# Patient Record
Sex: Female | Born: 1945 | Hispanic: No | Smoking: Former smoker
Health system: Southern US, Community
[De-identification: ages and names within clinical notes are randomized; demographics above are authoritative.]

## PROBLEM LIST (undated history)

## (undated) DIAGNOSIS — N840 Polyp of corpus uteri: Secondary | ICD-10-CM

## (undated) DIAGNOSIS — F32A Depression, unspecified: Secondary | ICD-10-CM

## (undated) DIAGNOSIS — I10 Essential (primary) hypertension: Secondary | ICD-10-CM

## (undated) DIAGNOSIS — F329 Major depressive disorder, single episode, unspecified: Secondary | ICD-10-CM

## (undated) DIAGNOSIS — M1990 Unspecified osteoarthritis, unspecified site: Secondary | ICD-10-CM

## (undated) DIAGNOSIS — F419 Anxiety disorder, unspecified: Secondary | ICD-10-CM

## (undated) DIAGNOSIS — C801 Malignant (primary) neoplasm, unspecified: Secondary | ICD-10-CM

## (undated) DIAGNOSIS — E039 Hypothyroidism, unspecified: Secondary | ICD-10-CM

## (undated) DIAGNOSIS — E785 Hyperlipidemia, unspecified: Secondary | ICD-10-CM

## (undated) HISTORY — DX: Anxiety disorder, unspecified: F41.9

## (undated) HISTORY — DX: Essential (primary) hypertension: I10

## (undated) HISTORY — DX: Hyperlipidemia, unspecified: E78.5

## (undated) HISTORY — PX: TOTAL KNEE ARTHROPLASTY: SHX125

## (undated) HISTORY — DX: Depression, unspecified: F32.A

## (undated) HISTORY — DX: Malignant (primary) neoplasm, unspecified: C80.1

## (undated) HISTORY — PX: BREAST SURGERY: SHX581

## (undated) HISTORY — DX: Major depressive disorder, single episode, unspecified: F32.9

## (undated) HISTORY — PX: TOTAL ABDOMINAL HYSTERECTOMY W/ BILATERAL SALPINGOOPHORECTOMY: SHX83

## (undated) HISTORY — PX: APPENDECTOMY: SHX54

---

## 1975-03-21 HISTORY — PX: LAPAROSCOPY FOR ECTOPIC PREGNANCY: SUR765

## 2012-07-04 ENCOUNTER — Encounter: Payer: Self-pay | Admitting: Family

## 2012-07-04 ENCOUNTER — Ambulatory Visit (INDEPENDENT_AMBULATORY_CARE_PROVIDER_SITE_OTHER): Payer: Medicare HMO | Admitting: Family

## 2012-07-04 VITALS — BP 96/60 | HR 71 | Ht 64.5 in | Wt 195.0 lb

## 2012-07-04 DIAGNOSIS — F32A Depression, unspecified: Secondary | ICD-10-CM | POA: Insufficient documentation

## 2012-07-04 DIAGNOSIS — M1710 Unilateral primary osteoarthritis, unspecified knee: Secondary | ICD-10-CM

## 2012-07-04 DIAGNOSIS — E7800 Pure hypercholesterolemia, unspecified: Secondary | ICD-10-CM

## 2012-07-04 DIAGNOSIS — I10 Essential (primary) hypertension: Secondary | ICD-10-CM

## 2012-07-04 DIAGNOSIS — E039 Hypothyroidism, unspecified: Secondary | ICD-10-CM | POA: Insufficient documentation

## 2012-07-04 DIAGNOSIS — E559 Vitamin D deficiency, unspecified: Secondary | ICD-10-CM

## 2012-07-04 DIAGNOSIS — F329 Major depressive disorder, single episode, unspecified: Secondary | ICD-10-CM

## 2012-07-04 DIAGNOSIS — E119 Type 2 diabetes mellitus without complications: Secondary | ICD-10-CM

## 2012-07-04 DIAGNOSIS — Z1231 Encounter for screening mammogram for malignant neoplasm of breast: Secondary | ICD-10-CM

## 2012-07-04 DIAGNOSIS — IMO0002 Reserved for concepts with insufficient information to code with codable children: Secondary | ICD-10-CM

## 2012-07-04 DIAGNOSIS — F3289 Other specified depressive episodes: Secondary | ICD-10-CM

## 2012-07-04 LAB — CBC WITH DIFFERENTIAL/PLATELET
Basophils Absolute: 0 10*3/uL (ref 0.0–0.1)
Basophils Relative: 0.4 % (ref 0.0–3.0)
Eosinophils Absolute: 0.2 10*3/uL (ref 0.0–0.7)
Eosinophils Relative: 3.3 % (ref 0.0–5.0)
HCT: 38.7 % (ref 36.0–46.0)
Hemoglobin: 12.9 g/dL (ref 12.0–15.0)
Lymphs Abs: 1.8 10*3/uL (ref 0.7–4.0)
MCV: 89.1 fl (ref 78.0–100.0)
Monocytes Relative: 5.5 % (ref 3.0–12.0)
Platelets: 215 10*3/uL (ref 150.0–400.0)
RBC: 4.34 Mil/uL (ref 3.87–5.11)
WBC: 5.3 10*3/uL (ref 4.5–10.5)

## 2012-07-04 LAB — TSH: TSH: 0.71 u[IU]/mL (ref 0.35–5.50)

## 2012-07-04 LAB — BASIC METABOLIC PANEL
BUN: 27 mg/dL — ABNORMAL HIGH (ref 6–23)
CO2: 33 mEq/L — ABNORMAL HIGH (ref 19–32)
Calcium: 9.5 mg/dL (ref 8.4–10.5)
Creatinine, Ser: 1.3 mg/dL — ABNORMAL HIGH (ref 0.4–1.2)
GFR: 44.38 mL/min — ABNORMAL LOW (ref >60.00)
Glucose, Bld: 78 mg/dL (ref 70–99)
Sodium: 142 mEq/L (ref 135–145)

## 2012-07-04 LAB — LIPID PANEL: Total CHOL/HDL Ratio: 3

## 2012-07-04 MED ORDER — ATORVASTATIN CALCIUM 20 MG PO TABS: 20.0000 mg | ORAL_TABLET | Freq: Every day | ORAL | Status: DC

## 2012-07-04 NOTE — Progress Notes (Signed)
Subjective:    Patient ID: Kara Huynh, female    DOB: 1945/05/08, 67 y.o.   MRN: [id number]  HPI 67 year old white female, smoker, new patient to the practice is in today with a history of Hypertension, Hypercholesterolemia, Arthritis, Hypothyroidism, and Hypercholesterolemia. She has c/o right index finger pain x 3-4 days. Report a recent relocation from [place] to [place]. It continues to be tender with movement. Her symptoms have improved. She rates the pain 3/10. Has a history of arthritis.    Review of Systems  Constitutional: Negative.   HENT: Negative.   Eyes: Negative.   Respiratory: Negative.   Cardiovascular: Negative.   Gastrointestinal: Negative.   Endocrine: Negative.   Genitourinary: Negative.   Musculoskeletal: Negative.   Skin: Negative.   Allergic/Immunologic: Negative.   Neurological: Negative.   Hematological: Negative.   Psychiatric/Behavioral: Negative.    Past Medical History  Diagnosis Date  . Diabetes mellitus without complication   . Hyperlipidemia   . Hypertension   . Thyroid disease     History   Social History  . Marital Status: Married    Spouse Name: N/A    Number of Children: N/A  . Years of Education: N/A   Occupational History  . Not on file.   Social History Main Topics  . Smoking status: Former Smoker    Quit date: 07/04/2009  . Smokeless tobacco: Not on file  . Alcohol Use: Yes     Comment: twice monthly  . Drug Use: No  . Sexually Active: Not on file   Other Topics Concern  . Not on file   Social History Narrative  . No narrative on file    Past Surgical History  Procedure Laterality Date  . Total knee arthroplasty Bilateral   . Breast surgery      biopsy  . Appendectomy      Family History  Problem Relation Age of Onset  . Arthritis Mother   . Mental illness Mother   . Diabetes Mother   . Cancer Father     colon, lung, and prostate  . Arthritis Father   . Hyperlipidemia Father   . Stroke Father   . Heart  disease Father   . Hypertension Father     Allergies not on file  No current outpatient prescriptions on file prior to visit.   No current facility-administered medications on file prior to visit.    BP 96/60  Pulse 71  Ht 5' 4.5" (1.638 m)  Wt 195 lb (88.451 kg)  BMI 32.97 kg/m2  SpO2 98%chart    Objective:   Physical Exam  Constitutional: She is oriented to person, place, and time. She appears well-developed and well-nourished.  HENT:  Right Ear: External ear normal.  Mouth/Throat: Oropharynx is clear and moist.  Eyes: Conjunctivae are normal. Pupils are equal, round, and reactive to light.  Neck: Normal range of motion. Neck supple. No thyromegaly present.  Cardiovascular: Normal rate, regular rhythm and normal heart sounds.   Pulmonary/Chest: Effort normal and breath sounds normal.  Abdominal: Soft. Bowel sounds are normal.  Musculoskeletal: Normal range of motion.  Neurological: She is alert and oriented to person, place, and time. She has normal reflexes.  Skin: Skin is warm and dry.  Psychiatric: She has a normal mood and affect.          Assessment & Plan:  Assessment:  1. Type 2 Diabetes  2. Hypothyroidism 3. Hypertension 4. Hypercholesterolemia  5. Osteoarthritis   Plan: Labs sent. Mammogram  ordered. Aleve as needed for arthritis of the left index finger. Call the office if symptoms persist then we'll consider an x-ray. Continue current medications. Call the office with any questions or concerns. Recheck pending labs, in 3 months, and sooner as needed.

## 2012-07-04 NOTE — Patient Instructions (Addendum)
Exercise to Stay Healthy Exercise helps you become and stay healthy. EXERCISE IDEAS AND TIPS Choose exercises that:  You enjoy.  Fit into your day. You do not need to exercise really hard to be healthy. You can do exercises at a slow or medium level and stay healthy. You can:  Stretch before and after working out.  Try yoga, Pilates, or tai chi.  Lift weights.  Walk fast, swim, jog, run, climb stairs, bicycle, dance, or rollerskate.  Take aerobic classes. Exercises that burn about 150 calories:  Running 1  miles in 15 minutes.  Playing volleyball for 45 to 60 minutes.  Washing and waxing a car for 45 to 60 minutes.  Playing touch football for 45 minutes.  Walking 1  miles in 35 minutes.  Pushing a stroller 1  miles in 30 minutes.  Playing basketball for 30 minutes.  Raking leaves for 30 minutes.  Bicycling 5 miles in 30 minutes.  Walking 2 miles in 30 minutes.  Dancing for 30 minutes.  Shoveling snow for 15 minutes.  Swimming laps for 20 minutes.  Walking up stairs for 15 minutes.  Bicycling 4 miles in 15 minutes.  Gardening for 30 to 45 minutes.  Jumping rope for 15 minutes.  Washing windows or floors for 45 to 60 minutes. Document Released: 04/08/2010 Document Revised: 05/29/2011 Document Reviewed: 04/08/2010 ExitCare Patient Information 2013 [employer].  

## 2012-07-05 LAB — VITAMIN D 25 HYDROXY (VIT D DEFICIENCY, FRACTURES): Vit D, 25-Hydroxy: 59 ng/mL (ref 30–89)

## 2012-08-01 ENCOUNTER — Ambulatory Visit (INDEPENDENT_AMBULATORY_CARE_PROVIDER_SITE_OTHER): Payer: Medicare HMO | Admitting: Family

## 2012-08-01 ENCOUNTER — Ambulatory Visit
Admission: RE | Admit: 2012-08-01 | Discharge: 2012-08-01 | Disposition: A | Discharge status: Home / Self Care | Payer: Medicare HMO | Source: Ambulatory Visit | Attending: Family | Admitting: Family

## 2012-08-01 ENCOUNTER — Encounter: Payer: Self-pay | Admitting: Family

## 2012-08-01 VITALS — BP 80/52 | HR 87 | Ht 63.5 in | Wt 198.0 lb

## 2012-08-01 DIAGNOSIS — Z23 Encounter for immunization: Secondary | ICD-10-CM

## 2012-08-01 DIAGNOSIS — Z1231 Encounter for screening mammogram for malignant neoplasm of breast: Secondary | ICD-10-CM

## 2012-08-01 DIAGNOSIS — M19019 Primary osteoarthritis, unspecified shoulder: Secondary | ICD-10-CM

## 2012-08-01 DIAGNOSIS — Z0000 Encounter for general adult medical examination without abnormal findings: Secondary | ICD-10-CM

## 2012-08-01 MED ORDER — METFORMIN HCL 500 MG PO TABS: 500.0000 mg | ORAL_TABLET | Freq: Two times a day (BID) | ORAL | Status: DC

## 2012-08-01 MED ORDER — MELOXICAM 15 MG PO TABS: 15.0000 mg | ORAL_TABLET | Freq: Every day | ORAL | Status: DC

## 2012-08-01 MED ORDER — PHENTERMINE HCL 37.5 MG PO CAPS: 37.5000 mg | ORAL_CAPSULE | ORAL | Status: DC

## 2012-08-01 MED ORDER — LISINOPRIL 20 MG PO TABS: 10.0000 mg | ORAL_TABLET | Freq: Every day | ORAL | Status: DC

## 2012-08-01 MED ORDER — LEVOTHYROXINE SODIUM 25 MCG PO TABS: 25.0000 ug | ORAL_TABLET | Freq: Every day | ORAL | Status: DC

## 2012-08-01 MED ORDER — SERTRALINE HCL 100 MG PO TABS: 150.0000 mg | ORAL_TABLET | Freq: Every day | ORAL | Status: DC

## 2012-08-01 MED ORDER — LISINOPRIL 20 MG PO TABS: 20.0000 mg | ORAL_TABLET | Freq: Every day | ORAL | Status: DC

## 2012-08-01 NOTE — Patient Instructions (Addendum)
Osteoarthritis Osteoarthritis is the most common form of arthritis. It is redness, soreness, and swelling (inflammation) affecting the cartilage. Cartilage acts as a cushion, covering the ends of bones where they meet to form a joint. CAUSES  Over time, the cartilage begins to wear away. This causes bone to rub on bone. This produces pain and stiffness in the affected joints. Factors that contribute to this problem are:  Excessive body weight.  Age.  Overuse of joints. SYMPTOMS   People with osteoarthritis usually experience joint pain, swelling, or stiffness.  Over time, the joint may lose its normal shape.  Small deposits of bone (osteophytes) may grow on the edges of the joint.  Bits of bone or cartilage can break off and float inside the joint space. This may cause more pain and damage.  Osteoarthritis can lead to depression, anxiety, feelings of helplessness, and limitations on daily activities. The most commonly affected joints are in the:  Ends of the fingers.  Thumbs.  Neck.  Lower back.  Knees.  Hips. DIAGNOSIS  Diagnosis is mostly based on your symptoms and exam. Tests may be helpful, including:  X-rays of the affected joint.  A computerized magnetic scan (MRI).  Blood tests to rule out other types of arthritis.  Joint fluid tests. This involves using a needle to draw fluid from the joint and examining the fluid under a microscope. TREATMENT  Goals of treatment are to control pain, improve joint function, maintain a normal body weight, and maintain a healthy lifestyle. Treatment approaches may include:  A prescribed exercise program with rest and joint relief.  Weight control with nutritional education.  Pain relief techniques such as:  Properly applied heat and cold.  Electric pulses delivered to nerve endings under the skin (transcutaneous electrical nerve stimulation, TENS).  Massage.  Certain supplements. Ask your caregiver before using any  supplements, especially in combination with prescribed drugs.  Medicines to control pain, such as:  Acetaminophen.  Nonsteroidal anti-inflammatory drugs (NSAIDs), such as naproxen.  Narcotic or central-acting agents, such as tramadol. This drug carries a risk of addiction and is generally prescribed for short-term use.  Corticosteroids. These can be given orally or as injection. This is a short-term treatment, not recommended for routine use.  Surgery to reposition the bones and relieve pain (osteotomy) or to remove loose pieces of bone and cartilage. Joint replacement may be needed in advanced states of osteoarthritis. HOME CARE INSTRUCTIONS  Your caregiver can recommend specific types of exercise. These may include:  Strengthening exercises. These are done to strengthen the muscles that support joints affected by arthritis. They can be performed with weights or with exercise bands to add resistance.  Aerobic activities. These are exercises, such as brisk walking or low-impact aerobics, that get your heart pumping. They can help keep your lungs and circulatory system in shape.  Range-of-motion activities. These keep your joints limber.  Balance and agility exercises. These help you maintain daily living skills. Learning about your condition and being actively involved in your care will help improve the course of your osteoarthritis. SEEK MEDICAL CARE IF:   You feel hot or your skin turns red.  You develop a rash in addition to your joint pain.  You have an oral temperature above 102 F (38.9 C). FOR MORE INFORMATION  National Institute of Arthritis and Musculoskeletal and Skin Diseases: www.niams.nih.gov National Institute on Aging: www.nia.nih.gov American College of Rheumatology: www.rheumatology.org Document Released: 03/06/2005 Document Revised: 05/29/2011 Document Reviewed: 06/17/2009 Exit[employer] Patient Information 2013 [employer].  

## 2012-08-01 NOTE — Progress Notes (Signed)
Subjective:    Patient ID: Kara Huynh, female    DOB: 01/23/1946, 67 y.o.   MRN: [id number]  HPI This is a routine physical examination for this healthy  Female. Reviewed all health maintenance protocols including mammography colonoscopy bone density and reviewed appropriate screening labs. Her immunization history was reviewed as well as her current medications and allergies refills of her chronic medications were given and the plan for yearly health maintenance was discussed all orders and referrals were made as appropriate.  Patient has concerns of right neck and shoulder pain has been ongoing for several months. The pain is worse in the morning and better as the day progresses. She rates the pain 8/10, worse with movement. Reports experiencing weakness in her right upper extremity over the last several weeks. Has not tried any medication for relief. Also has concerns of obesity. She exercises daily, walking. Would like some assistance with weight reduction.   Review of Systems  Constitutional: Negative.   HENT: Negative.   Eyes: Negative.   Respiratory: Negative.   Cardiovascular: Negative.   Gastrointestinal: Negative.   Endocrine: Negative.   Genitourinary: Negative.   Musculoskeletal: Positive for arthralgias. Negative for myalgias, back pain, joint swelling and gait problem.       Right shoulder pain and right neck pain  Allergic/Immunologic: Negative.   Neurological: Negative.   Hematological: Negative.   Psychiatric/Behavioral: Negative.    Past Medical History  Diagnosis Date  . Diabetes mellitus without complication   . Hyperlipidemia   . Hypertension   . Thyroid disease     History   Social History  . Marital Status: Married    Spouse Name: N/A    Number of Children: N/A  . Years of Education: N/A   Occupational History  . Not on file.   Social History Main Topics  . Smoking status: Former Smoker    Quit date: 07/04/2009  . Smokeless tobacco: Not on file   . Alcohol Use: Yes     Comment: twice monthly  . Drug Use: No  . Sexually Active: Not on file   Other Topics Concern  . Not on file   Social History Narrative  . No narrative on file    Past Surgical History  Procedure Laterality Date  . Total knee arthroplasty Bilateral   . Breast surgery      biopsy  . Appendectomy      Family History  Problem Relation Age of Onset  . Arthritis Mother   . Mental illness Mother   . Diabetes Mother   . Cancer Father     colon, lung, and prostate  . Arthritis Father   . Hyperlipidemia Father   . Stroke Father   . Heart disease Father   . Hypertension Father     Not on File  Current Outpatient Prescriptions on File Prior to Visit  Medication Sig Dispense Refill  . atorvastatin (LIPITOR) 20 MG tablet Take 1 tablet (20 mg total) by mouth daily.  90 tablet  1  . Cholecalciferol (VITAMIN D) 2000 UNITS CAPS Take by mouth.      . Cyanocobalamin 6000 MCG SUBL Place under the tongue.       No current facility-administered medications on file prior to visit.    BP 80/52  Pulse 87  Ht 5' 3.5" (1.613 m)  Wt 198 lb (89.812 kg)  BMI 34.52 kg/m2  SpO2 98%chart    Objective:   Physical Exam  Constitutional: She is oriented to person, place,  and time. She appears well-developed and well-nourished.  HENT:  Head: Normocephalic.  Right Ear: External ear normal.  Left Ear: External ear normal.  Nose: Nose normal.  Mouth/Throat: Oropharynx is clear and moist.  Eyes: Conjunctivae and EOM are normal. Pupils are equal, round, and reactive to light.  Neck: Normal range of motion. Neck supple.  Cardiovascular: Normal rate, regular rhythm and normal heart sounds.   Pulmonary/Chest: Effort normal and breath sounds normal.  Abdominal: Soft. Bowel sounds are normal. She exhibits no distension. There is no tenderness. There is no rebound and no guarding.  Musculoskeletal: Normal range of motion.  Neurological: She is alert and oriented to person,  place, and time. She has normal reflexes. She displays normal reflexes. No cranial nerve deficit. Coordination normal.  Skin: Skin is warm and dry.  Psychiatric: She has a normal mood and affect.      EKG: Normal sinus rhythm, within normal limits      Assessment & Plan:  Assessment:  1. Complete physical exam 2. Right shoulder pain 3. Obesity 4. Hypertension  Plan: Immunizations updated. Mammogram done this morning. Start Mobic 15 mg one tablet by mouth with food daily. Decrease lisinopril to one half tablet once daily since she's hypotensive today and her previous office visit. Continue exercise daily. Will try patient on phentermine 37.5 mg once daily x1 month. Recheck in 4 weeks discuss potential side effects and risks. Patient verbalized understanding. Call the office with any questions or concerns. Cipro my chart. Recheck a schedule, in 4 weeks, and sooner as needed.

## 2012-10-03 ENCOUNTER — Telehealth: Payer: Self-pay | Admitting: Family

## 2012-10-03 NOTE — Telephone Encounter (Signed)
Patient called regarding prescription issue. When triage nurse called back, there was no answer. Left message for patient to call office back.

## 2012-10-03 NOTE — Telephone Encounter (Signed)
Call back attempted at 1616 on 7-17, vmail left

## 2012-10-03 NOTE — Telephone Encounter (Signed)
Left a message for pt to return call 

## 2012-10-04 NOTE — Telephone Encounter (Signed)
Pt called Kara Huynh back. Padonda pt - had rx for phentermine 37.5mg . Pt states that she is having no side effects, but also, it is not working at all.  She wants to know: change in dosage, or perhaps new medication or different help.

## 2012-10-07 NOTE — Telephone Encounter (Signed)
Left a message for pt to return call 

## 2012-10-07 NOTE — Telephone Encounter (Signed)
Are you exercising??? You must exercise 30 min a day at least 3 times a day. No change in med dosage.

## 2012-10-08 NOTE — Telephone Encounter (Signed)
Left detailed message to notify pt of Padonda's note. Ok to leave detailed message per [facility] note

## 2012-10-10 ENCOUNTER — Other Ambulatory Visit: Payer: Self-pay | Admitting: Family

## 2012-11-11 ENCOUNTER — Other Ambulatory Visit (HOSPITAL_COMMUNITY)
Admission: RE | Admit: 2012-11-11 | Discharge: 2012-11-11 | Disposition: A | Discharge status: Home / Self Care | Payer: Medicare HMO | Source: Ambulatory Visit | Attending: Family Medicine | Admitting: Family Medicine

## 2012-11-11 ENCOUNTER — Encounter: Payer: Self-pay | Admitting: Family Medicine

## 2012-11-11 ENCOUNTER — Ambulatory Visit (INDEPENDENT_AMBULATORY_CARE_PROVIDER_SITE_OTHER): Payer: Medicare HMO | Admitting: Family Medicine

## 2012-11-11 VITALS — BP 98/70 | Temp 99.1°F | Wt 201.0 lb

## 2012-11-11 DIAGNOSIS — Z113 Encounter for screening for infections with a predominantly sexual mode of transmission: Secondary | ICD-10-CM | POA: Insufficient documentation

## 2012-11-11 DIAGNOSIS — N939 Abnormal uterine and vaginal bleeding, unspecified: Secondary | ICD-10-CM

## 2012-11-11 DIAGNOSIS — N760 Acute vaginitis: Secondary | ICD-10-CM | POA: Insufficient documentation

## 2012-11-11 DIAGNOSIS — N898 Other specified noninflammatory disorders of vagina: Secondary | ICD-10-CM

## 2012-11-11 DIAGNOSIS — N950 Postmenopausal bleeding: Secondary | ICD-10-CM

## 2012-11-11 NOTE — Patient Instructions (Signed)
-  We have ordered labs or studies at this visit. It can take up to 1-2 weeks for results and processing. We will contact you with instructions IF your results are abnormal. Normal results will be released to your [facility]. If you have not heard from [name] or can not find your results in [facility] in 2 weeks please contact our office.  -We placed a referral for you as discussed - it will be very important that you see the gynecologist for evaluation regarding your vaginal bleeding. It usually takes about 1-2 weeks to process and schedule this referral. If you have not heard from [name] regarding this appointment in 2 weeks please contact our office.

## 2012-11-11 NOTE — Progress Notes (Signed)
Chief Complaint  Patient presents with  . Vaginal Bleeding    and hotflashes     HPI:  Kara Huynh is a 67 yo F patient of Adline Mango here for an acute visit for vaginal bleeding: -a little vaginal spotting start 4 days ago, none today -maybe a little discharge and odor -denies: pain, itchy, rectal bleeding, pelvic pain, NVD, fevers -postmenopausal, has never had bleeding after menopause since then   ROS: See pertinent positives and negatives per HPI.  Past Medical History  Diagnosis Date  . Diabetes mellitus without complication   . Hyperlipidemia   . Hypertension   . Thyroid disease     Family History  Problem Relation Age of Onset  . Arthritis Mother   . Mental illness Mother   . Diabetes Mother   . Cancer Father     colon, lung, and prostate  . Arthritis Father   . Hyperlipidemia Father   . Stroke Father   . Heart disease Father   . Hypertension Father     History   Social History  . Marital Status: Married    Spouse Name: N/A    Number of Children: N/A  . Years of Education: N/A   Social History Main Topics  . Smoking status: Former Smoker    Quit date: 07/04/2009  . Smokeless tobacco: None  . Alcohol Use: Yes     Comment: twice monthly  . Drug Use: No  . Sexual Activity: None   Other Topics Concern  . None   Social History Narrative  . None    Current outpatient prescriptions:atorvastatin (LIPITOR) 20 MG tablet, Take 1 tablet (20 mg total) by mouth daily., Disp: 90 tablet, Rfl: 1;  Cholecalciferol (VITAMIN D) 2000 UNITS CAPS, Take by mouth., Disp: , Rfl: ;  Cyanocobalamin 6000 MCG SUBL, Place under the tongue., Disp: , Rfl: ;  levothyroxine (SYNTHROID) 25 MCG tablet, Take 1 tablet (25 mcg total) by mouth daily before breakfast., Disp: 90 tablet, Rfl: 1 lisinopril (PRINIVIL,ZESTRIL) 20 MG tablet, Take 0.5 tablets (10 mg total) by mouth daily., Disp: 45 tablet, Rfl: 1;  meloxicam (MOBIC) 15 MG tablet, Take 1 tablet (15 mg total) by mouth  daily., Disp: 30 tablet, Rfl: 4;  metFORMIN (GLUCOPHAGE) 500 MG tablet, Take 1 tablet (500 mg total) by mouth 2 (two) times daily with a meal., Disp: 180 tablet, Rfl: 1 sertraline (ZOLOFT) 100 MG tablet, Take 1.5 tablets (150 mg total) by mouth daily., Disp: 135 tablet, Rfl: 1;  phentermine 37.5 MG capsule, Take 1 capsule (37.5 mg total) by mouth every morning., Disp: 30 capsule, Rfl: 1  EXAM:  Filed Vitals:   11/11/12 1053  BP: 98/70  Temp: 99.1 F (37.3 C)    Body mass index is 35.04 kg/(m^2).  GENERAL: vitals reviewed and listed above, alert, oriented, appears well hydrated and in no acute distress  HEENT: atraumatic, conjunttiva clear, no obvious abnormalities on inspection of external nose and ears  NECK: no obvious masses on inspection  GU: no lesion on ext/int genitalia, light brown vag discharge, on bimanual cervix uterus feels firm - no CMT  ABD: BS+, soft, NTP  MS: moves all extremities without noticeable abnormality  PSYCH: pleasant and cooperative, no obvious depression or anxiety  ASSESSMENT AND PLAN:  Discussed the following assessment and plan:  Vaginal spotting - Plan: Cervicovaginal ancillary only  Postmenopausal bleeding - Plan: Ambulatory referral to Gynecology  -advised to see gyn given post menopausal vaginal bleeding, discussed benign and potentially serious causes -  referral placed -today obtained wet prep, GC/Chlam testing and will contact pt with results and tx if needed -Patient advised to return or notify a doctor immediately if symptoms worsen or persist or new concerns arise.  Patient Instructions  -We have ordered labs or studies at this visit. It can take up to 1-2 weeks for results and processing. We will contact you with instructions IF your results are abnormal. Normal results will be released to your [facility]. If you have not heard from [name] or can not find your results in [facility] in 2 weeks please contact our office.  -We placed a referral  for you as discussed - it will be very important that you see the gynecologist for evaluation regarding your vaginal bleeding. It usually takes about 1-2 weeks to process and schedule this referral. If you have not heard from [name] regarding this appointment in 2 weeks please contact our office.           Kriste Basque R.

## 2012-11-13 NOTE — Progress Notes (Signed)
Quick Note:  Called and spoke with pt and pt is aware. ______ 

## 2012-11-20 ENCOUNTER — Telehealth: Payer: Self-pay | Admitting: Obstetrics and Gynecology

## 2012-11-20 ENCOUNTER — Encounter: Payer: Self-pay | Admitting: Obstetrics and Gynecology

## 2012-11-20 NOTE — Telephone Encounter (Signed)
Please call this internal new patient doctor referral and get her rescheduled with Dr. Edward Jolly or another MD. Patient was referred for  post menopausal bleeding. This is the second time we have rescheduled her appointment as she was initially in an 8:30 open slot on Friday's office meeting morning. This patient was on today's schedule with Dr. Edward Jolly. Thanks, Mervin Kung

## 2012-11-21 NOTE — Telephone Encounter (Signed)
Call to patient this am and appointment scheduled for 12-01-12.

## 2012-11-22 ENCOUNTER — Encounter: Payer: Self-pay | Admitting: Obstetrics and Gynecology

## 2012-11-22 NOTE — Telephone Encounter (Signed)
Thank you :)

## 2012-11-25 ENCOUNTER — Telehealth: Payer: Self-pay

## 2012-11-25 NOTE — Telephone Encounter (Signed)
Called pt. LMOVM asking her to arrive early for her appt. On 11-27-12.  She has an 8:30am appt. But asked if she could come in at 7:50 or 8:00am.

## 2012-11-27 ENCOUNTER — Encounter: Payer: Self-pay | Admitting: Obstetrics and Gynecology

## 2012-11-27 ENCOUNTER — Ambulatory Visit (INDEPENDENT_AMBULATORY_CARE_PROVIDER_SITE_OTHER): Payer: Medicare HMO | Admitting: Obstetrics and Gynecology

## 2012-11-27 VITALS — BP 134/70 | HR 64 | Ht 65.0 in | Wt 205.0 lb

## 2012-11-27 DIAGNOSIS — N760 Acute vaginitis: Secondary | ICD-10-CM

## 2012-11-27 DIAGNOSIS — R319 Hematuria, unspecified: Secondary | ICD-10-CM

## 2012-11-27 DIAGNOSIS — N950 Postmenopausal bleeding: Secondary | ICD-10-CM

## 2012-11-27 LAB — POCT URINALYSIS DIPSTICK
Bilirubin, UA: NEGATIVE
Ketones, UA: NEGATIVE
Leukocytes, UA: NEGATIVE
Nitrite, UA: NEGATIVE
pH, UA: 5

## 2012-11-27 NOTE — Progress Notes (Signed)
Patient ID: Kara Huynh, female   DOB: Nov 24, 1945, 67 y.o.   MRN: [id number] GYNECOLOGY PROBLEM VISIT  PCP: Kathlynn Grate, FNP  Referring provider:  Kriste Basque, D.O.  HPI: 67 y.o.   Married  Caucasian  female   [id number] with No LMP recorded. Patient is postmenopausal.  LMP age 60. here for   Postmenopausal bleeding. November 08, 2012 noted sudden vaginal pain when got up from watching TV. 3 - 4 days later started having vaginal bleeding - spotting.  Some odor with that.  Saw Dr. Selena Batten 11/11/12.  Had negative GC/CT.   No hormone therapy currently. Took HRT for less than one year. No vaginal estrogen cream.   Urine dip  - trace blood. GYNECOLOGIC HISTORY: No LMP recorded. Patient is postmenopausal. Sexually active:  yes Partner preference: female Contraception:  postmenopausal  Menopausal hormone therapy: no DES exposure:  no Blood transfusions:  no  Sexually transmitted diseases:  no GYN Procedures:  Laparoscopy for ectopic pregnancy in 1977--not sure which fallopian tube was removed.  Also had neosalpingostomy at the same time.  Mammogram:   06/2012 wnl:[facility].  History of left breast biopsy 2 year ago - normal.                Pap:   06/2012 wnl with PCP History of abnormal pap smear:  no   OB History   Grav Para Term Preterm Abortions TAB SAB Ect Mult Living   2    2  1 1            Family History  Problem Relation Age of Onset  . Arthritis Mother   . Mental illness Mother   . Diabetes Mother   . Cancer Father     colon, lung, and prostate  . Arthritis Father   . Hyperlipidemia Father   . Stroke Father   . Heart disease Father   . Hypertension Father     Patient Active Problem List   Diagnosis Date Noted  . Unspecified essential hypertension 07/04/2012  . Diabetes 07/04/2012  . Depression 07/04/2012  . Unspecified hypothyroidism 07/04/2012  . Pure hypercholesterolemia 07/04/2012  . Unspecified vitamin D deficiency 07/04/2012  . Osteoarthrosis,  unspecified whether generalized or localized, involving lower leg 07/04/2012    Past Medical History  Diagnosis Date  . Diabetes mellitus without complication   . Hyperlipidemia   . Hypertension   . Thyroid disease   . Anxiety   . Depression     Past Surgical History  Procedure Laterality Date  . Total knee arthroplasty Bilateral   . Breast surgery      biopsy  . Appendectomy    . Pelvic laparoscopy  1977    ectopic pregnancy    ALLERGIES: Review of patient's allergies indicates no known allergies.  Current Outpatient Prescriptions  Medication Sig Dispense Refill  . atorvastatin (LIPITOR) 20 MG tablet Take 1 tablet (20 mg total) by mouth daily.  90 tablet  1  . Cholecalciferol (VITAMIN D) 2000 UNITS CAPS Take by mouth.      . Cyanocobalamin 6000 MCG SUBL Place under the tongue.      Marland Kitchen levothyroxine (SYNTHROID) 25 MCG tablet Take 1 tablet (25 mcg total) by mouth daily before breakfast.  90 tablet  1  . lisinopril (PRINIVIL,ZESTRIL) 20 MG tablet Take 0.5 tablets (10 mg total) by mouth daily.  45 tablet  1  . meloxicam (MOBIC) 15 MG tablet Take 1 tablet (15 mg total) by mouth daily.  30 tablet  4  . metFORMIN (GLUCOPHAGE) 500 MG tablet Take 1 tablet (500 mg total) by mouth 2 (two) times daily with a meal.  180 tablet  1  . sertraline (ZOLOFT) 100 MG tablet Take 1.5 tablets (150 mg total) by mouth daily.  135 tablet  1   No current facility-administered medications for this visit.     ROS:  Pertinent items are noted in HPI.  SOCIAL HISTORY:  Retired.   PHYSICAL EXAMINATION:    BP 134/70  Pulse 64  Ht 5\' 5"  (1.651 m)  Wt 205 lb (92.987 kg)  BMI 34.11 kg/m2   Wt Readings from Last 3 Encounters:  11/27/12 205 lb (92.987 kg)  11/11/12 201 lb (91.173 kg)  08/01/12 198 lb (89.812 kg)     Ht Readings from Last 3 Encounters:  11/27/12 5\' 5"  (1.651 m)  08/01/12 5' 3.5" (1.613 m)  07/04/12 5' 4.5" (1.638 m)    General appearance: alert, cooperative and appears stated  age Lungs: clear to auscultation bilaterally. Heart: regular rate and rhythm Abdomen: vertical midline incision (States history ruptured appendix), soft, non-tender; no masses,  no organomegaly No abnormal inguinal nodes palpated Neurologic: Grossly normal  Pelvic: External genitalia:  no lesions              Urethra:  normal appearing urethra with no masses, tenderness or lesions              Bartholins and Skenes: normal                 Vagina: normal appearing vagina with normal color and dark yellow discharge, no lesions              Cervix: normal appearance                 Bimanual Exam:  Uterus:  uterus is normal size, shape, consistency and nontender                                      Adnexa: normal adnexa in size, nontender and no masses                                      Rectovaginal: Confirms                                      Anus:  normal sphincter tone, no lesions  Wet prep - pH 5.0.  Negative clue cells, yeast, and trichomonas.  ASSESSMENT  Post menopausal bleeding. Microscopic hematuria.  PLAN  Urine culture. Return for pelvic ultrasound, sonohysterogram, and endometrial biopsy.   An After Visit Summary was printed and given to the patient.

## 2012-11-27 NOTE — Patient Instructions (Signed)
Postmenopausal Bleeding Menopause is commonly referred to as the "change in life." It is a time when the fertile years, the time of ovulating and having menstrual periods, has come to an end. It is also determined by not having menstrual periods for 12 months.  Postmenopausal bleeding is any bleeding a woman has after she has entered into menopause. Any type of postmenopausal bleeding, even if it appears to be a typical menstrual period, is concerning. This should be evaluated by your caregiver.  CAUSES   Hormone therapy.  Cancer of the cervix or cancer of the lining of the uterus (endometrial cancer).  Thinning of the uterine lining (uterine atrophy).  Thyroid diseases.  Certain medicines.  Infection of the uterus or cervix.  Inflammation or irritation of the uterine lining (endometritis).  Estrogen-secreting tumors.  Growths (polyps) on the cervix, uterine lining, or uterus.  Uterine tumors (fibroids).  Being very overweight (obese). DIAGNOSIS  Your caregiver will take a medical history and ask questions. A physical exam will also be performed. Further tests may include:   A transvaginal ultrasound. An ultrasound wand or probe is inserted into your vagina to view the pelvic organs.  A biopsy of the lining of the uterus (endometrium). A sample of the endometrium is removed and examined.  A hysteroscopy. Your caregiver may use an instrument with a light and a camera attached to it (hysteroscope). The hysteroscope is used to look inside the uterus for problems.  A dilation and curettage (D&C). Tissue is removed from the uterine lining to be examined for problems. TREATMENT  Treatment depends on the cause of the bleeding. Some treatments include:   Surgery.  Medicines.  Hormones.  A hysteroscopy or D&C to remove polyps or fibroids.  Changing or stopping a current medicine you are taking. Talk to your caregiver about your specific treatment. HOME CARE INSTRUCTIONS    Maintain a healthy weight.  Keep regular pelvic exams and Pap tests. SEEK MEDICAL CARE IF:   You have bleeding, even if it is light in comparison to your previous periods.  Your bleeding lasts more than 1 week.  You have abdominal pain.  You develop bleeding with sexual intercourse. SEEK IMMEDIATE MEDICAL CARE IF:   You have a fever, chills, headache, dizziness, muscle aches, and bleeding.  You have severe pain with bleeding.  You are passing blood clots.  You have bleeding and need more than 1 pad an hour.  You feel faint. MAKE SURE YOU:  Understand these instructions.  Will watch your condition.  Will get help right away if you are not doing well or get worse. Document Released: 06/14/2005 Document Revised: 05/29/2011 Document Reviewed: 11/10/2010 [employer] Patient Information 2014 [employer].  

## 2012-11-28 LAB — URINE CULTURE: Organism ID, Bacteria: NO GROWTH

## 2012-12-04 ENCOUNTER — Telehealth: Payer: Self-pay | Admitting: Obstetrics and Gynecology

## 2012-12-04 NOTE — Telephone Encounter (Signed)
Returning your call. °

## 2012-12-04 NOTE — Telephone Encounter (Signed)
Returned call to patient. We discussed her insurance benefits for a SHGM. Since patient does not have out of network benefits she would be responsible for the full amount. Patient is not able to afford this procedure and asked if Dr. Edward Jolly would refer her to another doctor that is in network. Sent a message to Dr. Edward Jolly.

## 2012-12-09 NOTE — Telephone Encounter (Signed)
Returning call.

## 2012-12-23 ENCOUNTER — Other Ambulatory Visit: Payer: Self-pay | Admitting: Family

## 2013-01-01 NOTE — Telephone Encounter (Signed)
Thank you for the followup.

## 2013-01-01 NOTE — Telephone Encounter (Signed)
Spoke with this patient on 9/30 about being referred to Dr. Renaldo Fiddler at [facility]. Patient chose Dr. Renaldo Fiddler due to the fact that Dr. Edward Jolly is not in network [facility]). Patient asked if I would be willing to help her set up an appointment and refer her over. Patient was seen on 12/24/12 by Dr. Renaldo Fiddler and will continue care with her.

## 2013-01-17 ENCOUNTER — Encounter: Payer: Self-pay | Admitting: Family

## 2013-01-23 ENCOUNTER — Other Ambulatory Visit: Payer: Self-pay

## 2013-01-23 ENCOUNTER — Other Ambulatory Visit: Payer: Self-pay | Admitting: Family

## 2013-01-29 ENCOUNTER — Encounter (HOSPITAL_BASED_OUTPATIENT_CLINIC_OR_DEPARTMENT_OTHER): Payer: Self-pay | Admitting: *Deleted

## 2013-01-30 ENCOUNTER — Encounter (HOSPITAL_COMMUNITY): Payer: Self-pay | Admitting: Pharmacist

## 2013-01-30 ENCOUNTER — Encounter (HOSPITAL_COMMUNITY): Payer: Self-pay

## 2013-01-30 ENCOUNTER — Encounter (HOSPITAL_COMMUNITY)
Admission: RE | Admit: 2013-01-30 | Discharge: 2013-01-30 | Disposition: A | Discharge status: Home / Self Care | Payer: Medicare HMO | Source: Ambulatory Visit | Attending: Obstetrics and Gynecology | Admitting: Obstetrics and Gynecology

## 2013-01-30 LAB — BASIC METABOLIC PANEL
BUN: 22 mg/dL (ref 6–23)
CO2: 25 mEq/L (ref 19–32)
Calcium: 9.7 mg/dL (ref 8.4–10.5)
Chloride: 103 mEq/L (ref 96–112)
Creatinine, Ser: 1.23 mg/dL — ABNORMAL HIGH (ref 0.50–1.10)
Glucose, Bld: 82 mg/dL (ref 70–99)
Sodium: 139 mEq/L (ref 135–145)

## 2013-01-30 LAB — CBC
HCT: 39.3 % (ref 36.0–46.0)
MCH: 29.1 pg (ref 26.0–34.0)
MCV: 89.9 fL (ref 78.0–100.0)
Platelets: 224 10*3/uL (ref 150–400)
RBC: 4.37 MIL/uL (ref 3.87–5.11)
RDW: 14.5 % (ref 11.5–15.5)
WBC: 7.2 10*3/uL (ref 4.0–10.5)

## 2013-01-30 MED ORDER — DEXTROSE 5 % IV SOLN
2.0000 g | INTRAVENOUS | Status: AC
  Administered 2013-01-31: 2 g via INTRAVENOUS
  Filled 2013-01-30: qty 2

## 2013-01-30 NOTE — Patient Instructions (Signed)
Your procedure is scheduled on:01/31/13  Enter through the Main Entrance at :1:45 pm  Pick up desk phone and dial [phone] and inform us of your arrival.  Please call [phone] if you have any problems the morning of surgery.  Remember: Do not eat food or drink liquids, including water, after midnight:TONIGHT Clear liquids are ok until:11am Friday   You may brush your teeth the morning of surgery.  Take these meds the morning of surgery with a sip of water: thyroid pill, BP pill DO NOT TAKE METFORMIN FOR 24 HOURS PRIOR TO SURGERY  DO NOT wear jewelry, eye make-up, lipstick,body lotion, or dark fingernail polish.  (Polished toes are ok) You may wear deodorant.  If you are to be admitted after surgery, leave suitcase in car until your room has been assigned. Patients discharged on the day of surgery will not be allowed to drive home. Wear loose fitting, comfortable clothes for your ride home.

## 2013-01-30 NOTE — H&P (Addendum)
67 yo with postmenopausal bleeding presents for surgical mngt  PMHx:  HTN, hypothyroid, hypercholesterol, diabetes PSHx: ectopic, appy, bilateral knee replacement All:  None Meds: atorvastatin, meloxicam,  Lisinopril, metformin, zolfot, syntroid SHx:  Negative x 3  AF, VSs Gen - NAD ABd - soft, NT/ND CV - RRR Lungs - clear Ext - NT, no edema PV - uterus mobile, NT  A/P:  PMB, endometrial mass Hysteroscopy, D&C resection of mass Plan of care reviewed, informed consent

## 2013-01-31 ENCOUNTER — Encounter (HOSPITAL_COMMUNITY)
Admission: RE | Disposition: A | Discharge status: Home / Self Care | Payer: Self-pay | Source: Ambulatory Visit | Attending: Obstetrics and Gynecology

## 2013-01-31 ENCOUNTER — Encounter (HOSPITAL_COMMUNITY): Payer: Self-pay | Admitting: *Deleted

## 2013-01-31 ENCOUNTER — Ambulatory Visit (HOSPITAL_COMMUNITY): Payer: Medicare HMO | Admitting: Registered Nurse

## 2013-01-31 ENCOUNTER — Ambulatory Visit (HOSPITAL_BASED_OUTPATIENT_CLINIC_OR_DEPARTMENT_OTHER)
Admission: RE | Admit: 2013-01-31 | Payer: Medicare HMO | Source: Ambulatory Visit | Admitting: Obstetrics and Gynecology

## 2013-01-31 ENCOUNTER — Encounter (HOSPITAL_BASED_OUTPATIENT_CLINIC_OR_DEPARTMENT_OTHER): Admission: RE | Payer: Self-pay | Source: Ambulatory Visit

## 2013-01-31 ENCOUNTER — Encounter (HOSPITAL_COMMUNITY): Payer: Medicare HMO | Admitting: Registered Nurse

## 2013-01-31 ENCOUNTER — Ambulatory Visit (HOSPITAL_COMMUNITY)
Admission: RE | Admit: 2013-01-31 | Discharge: 2013-01-31 | Disposition: A | Discharge status: Home / Self Care | Payer: Medicare HMO | Source: Ambulatory Visit | Attending: Obstetrics and Gynecology | Admitting: Obstetrics and Gynecology

## 2013-01-31 DIAGNOSIS — N8502 Endometrial intraepithelial neoplasia [EIN]: Secondary | ICD-10-CM | POA: Insufficient documentation

## 2013-01-31 DIAGNOSIS — N840 Polyp of corpus uteri: Secondary | ICD-10-CM | POA: Insufficient documentation

## 2013-01-31 DIAGNOSIS — N950 Postmenopausal bleeding: Secondary | ICD-10-CM | POA: Insufficient documentation

## 2013-01-31 HISTORY — DX: Polyp of corpus uteri: N84.0

## 2013-01-31 HISTORY — DX: Hypothyroidism, unspecified: E03.9

## 2013-01-31 HISTORY — PX: DILITATION & CURRETTAGE/HYSTROSCOPY WITH VERSAPOINT RESECTION: SHX5571

## 2013-01-31 HISTORY — DX: Unspecified osteoarthritis, unspecified site: M19.90

## 2013-01-31 SURGERY — DILATATION & CURETTAGE/HYSTEROSCOPY WITH VERSAPOINT RESECTION
Anesthesia: Choice

## 2013-01-31 SURGERY — DILATATION & CURETTAGE/HYSTEROSCOPY WITH VERSAPOINT RESECTION
Anesthesia: General | Site: Vagina | Wound class: Clean Contaminated

## 2013-01-31 MED ORDER — ONDANSETRON HCL 4 MG/2ML IJ SOLN
INTRAMUSCULAR | Status: AC
  Filled 2013-01-31: qty 2

## 2013-01-31 MED ORDER — LIDOCAINE HCL (CARDIAC) 20 MG/ML IV SOLN
INTRAVENOUS | Status: DC | PRN
  Administered 2013-01-31: 30 mg via INTRAVENOUS

## 2013-01-31 MED ORDER — FENTANYL CITRATE 0.05 MG/ML IJ SOLN: 25.0000 ug | INTRAMUSCULAR | Status: DC | PRN

## 2013-01-31 MED ORDER — LIDOCAINE HCL 1 % IJ SOLN
INTRAMUSCULAR | Status: AC
  Filled 2013-01-31: qty 20

## 2013-01-31 MED ORDER — METOCLOPRAMIDE HCL 5 MG/ML IJ SOLN: 10.0000 mg | Freq: Once | INTRAMUSCULAR | Status: DC | PRN

## 2013-01-31 MED ORDER — MEPERIDINE HCL 25 MG/ML IJ SOLN: 6.2500 mg | INTRAMUSCULAR | Status: DC | PRN

## 2013-01-31 MED ORDER — MIDAZOLAM HCL 2 MG/2ML IJ SOLN
INTRAMUSCULAR | Status: AC
  Filled 2013-01-31: qty 2

## 2013-01-31 MED ORDER — ONDANSETRON HCL 4 MG/2ML IJ SOLN
INTRAMUSCULAR | Status: DC | PRN
  Administered 2013-01-31: 4 mg via INTRAVENOUS

## 2013-01-31 MED ORDER — EPHEDRINE SULFATE 50 MG/ML IJ SOLN
INTRAMUSCULAR | Status: DC | PRN
  Administered 2013-01-31 (×3): 5 mg via INTRAVENOUS

## 2013-01-31 MED ORDER — KETOROLAC TROMETHAMINE 30 MG/ML IJ SOLN
INTRAMUSCULAR | Status: AC
  Filled 2013-01-31: qty 1

## 2013-01-31 MED ORDER — SODIUM CHLORIDE 0.9 % IR SOLN
Status: DC | PRN
  Administered 2013-01-31: 1

## 2013-01-31 MED ORDER — LIDOCAINE HCL (CARDIAC) 20 MG/ML IV SOLN
INTRAVENOUS | Status: AC
  Filled 2013-01-31: qty 5

## 2013-01-31 MED ORDER — EPHEDRINE 5 MG/ML INJ
INTRAVENOUS | Status: AC
  Filled 2013-01-31: qty 10

## 2013-01-31 MED ORDER — FENTANYL CITRATE 0.05 MG/ML IJ SOLN
INTRAMUSCULAR | Status: AC
  Filled 2013-01-31: qty 5

## 2013-01-31 MED ORDER — LACTATED RINGERS IV SOLN
INTRAVENOUS | Status: DC
  Administered 2013-01-31: 15:00:00 via INTRAVENOUS

## 2013-01-31 MED ORDER — MIDAZOLAM HCL 2 MG/2ML IJ SOLN
INTRAMUSCULAR | Status: DC | PRN
  Administered 2013-01-31: 2 mg via INTRAVENOUS

## 2013-01-31 MED ORDER — HYDROCODONE-IBUPROFEN 7.5-200 MG PO TABS: 1.0000 | ORAL_TABLET | Freq: Four times a day (QID) | ORAL | Status: DC | PRN

## 2013-01-31 MED ORDER — LIDOCAINE HCL 1 % IJ SOLN
INTRAMUSCULAR | Status: DC | PRN
  Administered 2013-01-31: 10 mL

## 2013-01-31 MED ORDER — FENTANYL CITRATE 0.05 MG/ML IJ SOLN
INTRAMUSCULAR | Status: DC | PRN
  Administered 2013-01-31: 100 ug via INTRAVENOUS

## 2013-01-31 MED ORDER — PROPOFOL 10 MG/ML IV BOLUS
INTRAVENOUS | Status: DC | PRN
  Administered 2013-01-31: 100 mg via INTRAVENOUS

## 2013-01-31 MED ORDER — KETOROLAC TROMETHAMINE 30 MG/ML IJ SOLN
INTRAMUSCULAR | Status: DC | PRN
  Administered 2013-01-31: 30 mg via INTRAVENOUS

## 2013-01-31 MED ORDER — PROPOFOL 10 MG/ML IV EMUL
INTRAVENOUS | Status: AC
  Filled 2013-01-31: qty 20

## 2013-01-31 SURGICAL SUPPLY — 16 items
CANISTER SUCT 3000ML (MISCELLANEOUS) ×2 IMPLANT
CATH ROBINSON RED A/P 16FR (CATHETERS) ×2 IMPLANT
CLOTH BEACON ORANGE TIMEOUT ST (SAFETY) ×2 IMPLANT
CONTAINER PREFILL 10% NBF 60ML (FORM) ×4 IMPLANT
DRESSING TELFA 8X3 (GAUZE/BANDAGES/DRESSINGS) ×2 IMPLANT
ELECT REM PT RETURN 9FT ADLT (ELECTROSURGICAL)
ELECTRODE REM PT RTRN 9FT ADLT (ELECTROSURGICAL) IMPLANT
ELECTRODE RT ANGLE VERSAPOINT (CUTTING LOOP) ×2 IMPLANT
GLOVE BIO SURGEON STRL SZ 6.5 (GLOVE) ×2 IMPLANT
GOWN STRL REIN XL XLG (GOWN DISPOSABLE) ×4 IMPLANT
LOOP ANGLED CUTTING 22FR (CUTTING LOOP) IMPLANT
NEEDLE SPNL 20GX3.5 QUINCKE YW (NEEDLE) ×2 IMPLANT
PACK HYSTEROSCOPY LF (CUSTOM PROCEDURE TRAY) ×2 IMPLANT
PAD OB MATERNITY 4.3X12.25 (PERSONAL CARE ITEMS) ×2 IMPLANT
TOWEL OR 17X24 6PK STRL BLUE (TOWEL DISPOSABLE) ×4 IMPLANT
WATER STERILE IRR 1000ML POUR (IV SOLUTION) ×2 IMPLANT

## 2013-01-31 NOTE — Transfer of Care (Signed)
Immediate Anesthesia Transfer of Care Note  Patient: Kara Huynh  Procedure(s) Performed: Procedure(s): DILATATION & CURETTAGE/HYSTEROSCOPY WITH CERVICAL BLOCK (N/A)  Patient Location: PACU  Anesthesia Type:General  Level of Consciousness: awake, alert , oriented and patient cooperative  Airway & Oxygen Therapy: Patient Spontanous Breathing  Post-op Assessment: Report given to PACU RN and Post -op Vital signs reviewed and stable  Post vital signs: Reviewed and stable  Complications: No apparent anesthesia complications

## 2013-01-31 NOTE — Anesthesia Preprocedure Evaluation (Signed)
Anesthesia Evaluation  Patient identified by MRN, date of birth, ID band Patient awake    Reviewed: Allergy & Precautions, H&P , NPO status , Patient's Chart, lab work & pertinent test results  Airway Mallampati: III      Dental no notable dental hx. (+) Teeth Intact   Pulmonary neg pulmonary ROS, former smoker,  breath sounds clear to auscultation- rhonchi  Pulmonary exam normal       Cardiovascular hypertension, Pt. on medications Rhythm:Regular Rate:Normal     Neuro/Psych PSYCHIATRIC DISORDERS Anxiety Depression negative neurological ROS     GI/Hepatic Neg liver ROS,   Endo/Other  diabetes, Well Controlled, Type 2, Oral Hypoglycemic AgentsHypothyroidism   Renal/GU negative Renal ROS  negative genitourinary   Musculoskeletal  (+) Arthritis -, Osteoarthritis,    Abdominal   Peds  Hematology negative hematology ROS (+)   Anesthesia Other Findings   Reproductive/Obstetrics Endometrial Polyp PMB                           Anesthesia Physical Anesthesia Plan  ASA: II  Anesthesia Plan: General   Post-op Pain Management:    Induction: Intravenous  Airway Management Planned: LMA  Additional Equipment:   Intra-op Plan:   Post-operative Plan: Extubation in OR  Informed Consent: I have reviewed the patients History and Physical, chart, labs and discussed the procedure including the risks, benefits and alternatives for the proposed anesthesia with the patient or authorized representative who has indicated his/her understanding and acceptance.   Dental advisory given  Plan Discussed with: Anesthesiologist, CRNA and Surgeon  Anesthesia Plan Comments:         Anesthesia Quick Evaluation

## 2013-01-31 NOTE — Anesthesia Postprocedure Evaluation (Signed)
  Anesthesia Post-op Note  Patient: Kara Huynh  Procedure(s) Performed: Procedure(s): DILATATION & CURETTAGE/HYSTEROSCOPY WITH CERVICAL BLOCK (N/A)  Patient Location: PACU  Anesthesia Type:General  Level of Consciousness: awake, alert  and oriented  Airway and Oxygen Therapy: Patient Spontanous Breathing  Post-op Pain: none  Post-op Assessment: Post-op Vital signs reviewed, Patient's Cardiovascular Status Stable, Respiratory Function Stable, Patent Airway, No signs of Nausea or vomiting and Pain level controlled  Post-op Vital Signs: Reviewed and stable  Complications: No apparent anesthesia complications

## 2013-02-01 NOTE — Op Note (Signed)
NAME:  Kara Huynh, Kara Huynh NO.:  [id number]  MEDICAL RECORD NO.:  [id number]  LOCATION:  [facility]                          FACILITY:  [facility]  PHYSICIAN:  Zelphia Cairo, MD    DATE OF BIRTH:  [birth year]  DATE OF PROCEDURE: DATE OF DISCHARGE:  01/31/2013                              OPERATIVE REPORT   PREOPERATIVE DIAGNOSES: 1. Postmenopausal bleeding. 2. Endometrial mass.  POSTOPERATIVE DIAGNOSES: 1. Postmenopausal bleeding. 2. Endometrial mass, path pending.  PROCEDURE: 1. Cervical block. 2. Hysteroscopy. 3. D and C.  SURGEON:  Zelphia Cairo, MD  ANESTHESIA:  General.  SPECIMEN:  Endometrial curettings.  COMPLICATIONS:  None.  CONDITION:  Stable to recovery room.  DESCRIPTION OF PROCEDURE:  The patient was taken to the operating room, where anesthesia was found to be adequate.  She was placed in the dorsal lithotomy position using Allen stirrups.  She was prepped and draped in sterile fashion and an in- and out catheter was used to drain her bladder for an unmeasured amount of urine. Bivalve speculum was placed in the vagina and a cervical block was performed using 1% lidocaine. Single-tooth tenaculum was attached to the anterior lip of the cervix. The cervix was serially dilated using Pratt dilators.  Hysteroscope was inserted.  An irregularly shaped mass was noted on the right anterior uterine wall.  No other abnormalities were noted.  The hysteroscope was removed and a curette was used to resect the endometrial mass. Hysteroscope was reinserted and no other masses or abnormalities were seen.  The mass was removed to the base.  All instruments were then removed from the vagina and cervix.  The cervix was hemostatic.  She was extubated and taken to the recovery room in stable condition.  Sponge, lap, needle, and instrument counts were correct x2.     Zelphia Cairo, MD     GA/MEDQ  D:  01/31/2013  T:  02/01/2013  Job:  [id number]

## 2013-02-03 ENCOUNTER — Encounter (HOSPITAL_COMMUNITY): Payer: Self-pay | Admitting: Obstetrics and Gynecology

## 2013-02-03 LAB — GLUCOSE, CAPILLARY: Glucose-Capillary: 100 mg/dL — ABNORMAL HIGH (ref 70–99)

## 2013-02-11 ENCOUNTER — Telehealth: Payer: Self-pay | Admitting: Gynecologic Oncology

## 2013-02-11 ENCOUNTER — Encounter: Payer: Self-pay | Admitting: Gynecologic Oncology

## 2013-02-11 ENCOUNTER — Ambulatory Visit: Payer: Medicare HMO | Attending: Gynecologic Oncology | Admitting: Gynecologic Oncology

## 2013-02-11 VITALS — BP 116/60 | HR 75 | Temp 98.6°F | Resp 20 | Ht 64.17 in | Wt 209.2 lb

## 2013-02-11 DIAGNOSIS — F411 Generalized anxiety disorder: Secondary | ICD-10-CM | POA: Insufficient documentation

## 2013-02-11 DIAGNOSIS — E119 Type 2 diabetes mellitus without complications: Secondary | ICD-10-CM | POA: Insufficient documentation

## 2013-02-11 DIAGNOSIS — F3289 Other specified depressive episodes: Secondary | ICD-10-CM | POA: Insufficient documentation

## 2013-02-11 DIAGNOSIS — C541 Malignant neoplasm of endometrium: Secondary | ICD-10-CM | POA: Insufficient documentation

## 2013-02-11 DIAGNOSIS — Z79899 Other long term (current) drug therapy: Secondary | ICD-10-CM | POA: Insufficient documentation

## 2013-02-11 DIAGNOSIS — Z800 Family history of malignant neoplasm of digestive organs: Secondary | ICD-10-CM | POA: Insufficient documentation

## 2013-02-11 DIAGNOSIS — C549 Malignant neoplasm of corpus uteri, unspecified: Secondary | ICD-10-CM | POA: Insufficient documentation

## 2013-02-11 DIAGNOSIS — I10 Essential (primary) hypertension: Secondary | ICD-10-CM | POA: Insufficient documentation

## 2013-02-11 DIAGNOSIS — F329 Major depressive disorder, single episode, unspecified: Secondary | ICD-10-CM | POA: Insufficient documentation

## 2013-02-11 DIAGNOSIS — E039 Hypothyroidism, unspecified: Secondary | ICD-10-CM | POA: Insufficient documentation

## 2013-02-11 DIAGNOSIS — E785 Hyperlipidemia, unspecified: Secondary | ICD-10-CM | POA: Insufficient documentation

## 2013-02-11 NOTE — Patient Instructions (Addendum)
Plan for surgery on December 2 with Dr. De Blanch.  Uterine Cancer Uterine cancer is an abnormal growth of tissue (tumor) in the uterus that is cancerous (malignant). Unlike noncancerous (benign) tumors, malignant tumors can spread to other parts of your body. The wall of the uterus has two layers of tissue. The inner layer is the endometrium. The outer layer of muscle tissue is the myometrium. The most common type of uterine cancer begins in the endometrium. This is called endometrial cancer. Cancer that begins in the myometrium is called uterine sarcoma, which is very rare.  RISK FACTORS  Although the exact cause of uterine cancer is unknown, there are a number of risk factors that can increase your chances of getting uterine cancer. They include:  Your age. Uterine cancer occurs mostly in women older than 50 years.   Having an enlarged endometrium (endometrial hyperplasia).   Using hormone therapy.   Obesity.   Taking the drug tamoxifen.   White race.   Infertility.   Never being pregnant.   Beginning menstrual periods at an age younger than 12 years.   Having menstrual periods at an age older than 52 years.   Personal history of ovarian, intestinal, or colorectal cancer.   Having a family history of uterine cancer.   Having a family history of hereditary nonpolyposis colon cancer (HNPCC).   Having diabetes, high blood pressure, thyroid disease, or gallbladder disease.   Long-term use of high-dose birth control pills.   Exposure to radiation.   Smoking.  SIGNS AND SYMPTOMS   Abnormal vaginal bleeding or discharge. Bleeding may start as a watery, blood-streaked flow that gradually contains more blood.   Any vaginal bleeding after menopause.   Difficult or painful urination.   Pain during intercourse.   Pain in the pelvic area.  Mass in the vagina.  Pain or fullness in the abdomen.  Frequent urination.  Bleeding between  periods.  Growth of the stomach.   Unexplained weight loss.  Uterine cancer usually occurs after menopause. However, it may also occur around the time that menopause begins. Abnormal vaginal bleeding is the most common symptom of uterine cancer. Women should not assume that abnormal vaginal bleeding is part of menopause. DIAGNOSIS  Your health care provider will ask about your medical history. He or she may also perform a number of procedures, such as:  A physical and pelvic exam. Your health care provider will feel your pelvis for any lumps.   Blood and urine tests.   X-rays.   Imaging tests, such as CT scans, ultrasonography, or MRIs.   A hysteroscopy to view the inside of your uterus.   A Pap test to sample cells from the cervix and upper vagina to check for abnormal cells.   Taking a tissue sample (biopsy) from the uterine lining to look for cancer cells.   A dilation and curettage (D&C). This involves stretching (dilation) the cervix and scraping (curettage) the inside lining of the uterus to get a tissue sample. The sample is examined under a microscope to look for cancer cells.  Your cancer will be staged to determine its severity and extent. Staging is a careful attempt to find out the size of the tumor, whether the cancer has spread, and if so, to what parts of the body. You may need to have more tests to determine the stage of your cancer. The test results will help determine what treatment plan is best for you. Cancer stages include:   Stage I The  cancer is only found in the uterus.  Stage II The cancer has spread to the cervix.  Stage III The cancer has spread outside the uterus, but not outside the pelvis. The cancer may have spread to the lymph nodes in the pelvis.  Stage IV The cancer has spread to other parts of the body, such as the bladder or rectum. TREATMENT  Most women with uterine cancer are treated with surgery. This includes removing the uterus,  cervix, fallopian tubes, and ovaries (total hysterectomy). Your lymph nodes near the tumor may also be removed. Some women have radiation, chemotherapy, or hormonal therapy. Other women have a combination of these therapies. HOME CARE INSTRUCTIONS   Only take over-the-counter or prescription medicines as directed by your health care provider.   Maintain a healthy diet.  Exercise regularly.   If you have diabetes, high blood pressure, thyroid disease, or gallbladder disease, follow your health care provider's instructions to keep it under control.   Do not smoke.   Consider joining a support group. This may help you learn to cope with the stress of having uterine cancer.   Seek advice to help you manage treatment side effects.   Keep all follow-up appointments as directed by your health care provider.  SEEK MEDICAL CARE IF:  You have increased stomach or pelvic pain.  You cannot urinate.  You have abnormal bleeding. Document Released: 03/06/2005 Document Revised: 11/06/2012 Document Reviewed: 08/23/2012 [facility] Patient Information 2014 [place], [place].

## 2013-02-11 NOTE — Telephone Encounter (Signed)
Patient notified of Dr. Nelwyn Salisbury recommendations for a fleets enema the evening before surgery as a bowel prep.  Verbalizing understanding.  Instructed to call for any questions or concerns.

## 2013-02-11 NOTE — Progress Notes (Signed)
Consult Note: Gyn-Onc  Consult was requested by Dr. Renaldo Fiddler for the evaluation of Kara Huynh 67 y.o. female  CC:  Chief Complaint  Patient presents with  . Endometrial cancer    New consult    Assessment/Plan:  Kara Huynh  is a 67 y.o.  year old.  A detailed discussion was held with the patient and her family with regard to her endometrial cancer diagnosis. We discussed the standard management options for uterine cancer which includes surgery followed possibly by adjuvant therapy depending on the results of surgery. The options for surgical management include a hysterectomy and removal of the tubes and ovaries possibly with removal of pelvic and para-aortic lymph nodes. A minimally invasive approach including a robotic hysterectomy or laparoscopic hysterectomy have benefits including shorter hospital stay, recovery time and better wound healing. The alternative approach is an open hysterectomy. The patient has been counseled about these surgical options and the risks of the procedure which include but are not limited to infection, bleeding, damage to adjacent organs, nerves, vessels and clot formation and she is willing to proceed with open endometrial cancer staging with Dr. De Blanch on 02/18/2013.   All of her questions have been answered.    Given the family history of first-degree relative with colon cancer the plan is to assess for pathological MS IV IHC staining of the endometrial cancer specimen.  HPI: Kara Huynh  is a 67 y.o.  year old who noted the onset of vaginal bleeding in August of 2014. She reports hormonal therapy for approximately one year after menopause. A pelvic sonohysterogram on 01/29/2013 demonstrated a uterine length of 6.17 cm. Endometrial thickness was 0.24 cm. The cavity was filled with fluid and there was a 17 mm density seen within it. No right adnexal masses were appreciated. Left ovary was noted have a 14 x 11 mm simple cyst without  blood flow. There was no free fluid. 01/31/2013 she underwent hysteroscopy D&C. 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 She reports a colonoscopy in the remote past and was within normal limits.   She reports occasional spotting nausea and fatigue.   Current Meds:  Outpatient Encounter Prescriptions as of 02/11/2013  Medication Sig  . atorvastatin (LIPITOR) 20 MG tablet TAKE 1 TABLET (20 MG TOTAL) BY MOUTH DAILY.  Marland Kitchen Cholecalciferol (VITAMIN D) 2000 UNITS CAPS Take 1 capsule by mouth daily.   . Cyanocobalamin 6000 MCG SUBL Place 1 tablet under the tongue daily.   Marland Kitchen levothyroxine (SYNTHROID) 25 MCG tablet Take 1 tablet (25 mcg total) by mouth daily before breakfast.  . lisinopril (PRINIVIL,ZESTRIL) 20 MG tablet Take 0.5 tablets (10 mg total) by mouth daily.  . meloxicam (MOBIC) 15 MG tablet TAKE 1 TABLET (15 MG TOTAL) BY MOUTH DAILY.  . metFORMIN (GLUCOPHAGE) 500 MG tablet Take 1 tablet (500 mg total) by mouth 2 (two) times daily with a meal.  . sertraline (ZOLOFT) 100 MG tablet Take 1.5 tablets (150 mg total) by mouth daily.  Marland Kitchen HYDROcodone-ibuprofen (VICOPROFEN) 7.5-200 MG per tablet Take 1 tablet by mouth every 6 (six) hours as needed for moderate pain.    Allergy: No Known Allergies  Social Hx:   History   Social History  . Marital Status: Married    Spouse Name: N/A    Number of  Children: N/A  . Years of Education: N/A   Occupational History  . Not on file.   Social History Main Topics  . Smoking status: Former Smoker    Quit date: 07/04/2009  . Smokeless tobacco: Not on file  . Alcohol Use: Yes     Comment: twice monthly  . Drug Use: No  . Sexual Activity: Yes    Partners: Male    Birth Control/ Protection: Post-menopausal   Other Topics Concern  . Not on file   Social History  Narrative  . No narrative on file    Past Surgical Hx:  Past Surgical History  Procedure Laterality Date  . Total knee arthroplasty Bilateral   . Breast surgery      biopsy  . Laparoscopy for ectopic pregnancy  1977    SALPINGOSTOMY (SIDE UNKNOWN)  . Appendectomy    . Dilitation & currettage/hystroscopy with versapoint resection N/A 01/31/2013    Procedure: DILATATION & CURETTAGE/HYSTEROSCOPY WITH CERVICAL BLOCK;  Surgeon: Zelphia Cairo, MD;  Location: [facility];  Service: Gynecology;  Laterality: N/A;    Past Medical Hx:  Past Medical History  Diagnosis Date  . Hyperlipidemia   . Hypertension   . Anxiety   . Depression   . Hypothyroidism   . Type 2 diabetes mellitus   . Arthritis   . Endometrial polyp     Past Gynecological History:  G1 P0 extopic x 1, Menarche 12, regular menses, menopause 20 years ago.   No LMP recorded. Patient is postmenopausal.  No h/o abnormal pap.  Family Hx:  Family History  Problem Relation Age of Onset  . Arthritis Mother   . Mental illness Mother   . Diabetes Mother   . Cancer Father     colon, lung, and prostate  . Arthritis Father   . Hyperlipidemia Father   . Stroke Father   . Heart disease Father   . Hypertension Father     Review of Systems: Constitutional  Feels well, reports fatigue Cardiovascular  No chest pain, shortness of breath, or edema  Pulmonary  No cough or wheeze.  Gastro Intestinal  Nausea in the am relieved with eating, no vomitting, or diarrhoea. Good appetite. No bright red blood per rectum, no abdominal pain, change in bowel movement, or constipation.  Genito Urinary  No frequency, urgency, dysuria,reports bleeding,  Musculo Skeletal  No myalgia, arthralgia, joint swelling or pain  Neurologic  No weakness, numbness, change in gait,  Psychology  No depression, anxiety, insomnia.   Vitals:  Blood pressure 116/60, pulse 75, temperature 98.6 F (37 C), temperature source Oral, resp. rate 20, height 5'  4.17" (1.63 m), weight 209 lb 3.2 oz (94.892 kg).  Physical Exam: WD in NAD, poor short term memory Neck  Supple NROM, without any enlargements.  Lymph Node Survey No cervical supraclavicular or inguinal adenopathy Cardiovascular  Pulse normal rate, regularity and rhythm. S1 and S2 normal.  Lungs  Clear to auscultation bilateraly, without wheezes/crackles/rhonchi. Good air movement.  Skin  No rash/lesions/breakdown  Psychiatry  Alert and oriented to person, place, and time  Abdomen  Normoactive bowel sounds, abdomen soft, non-tender and obese.  Back No CVA tenderness Genito Urinary  Vulva/vagina: Normal external female genitalia.  No lesions.    Bladder/urethra:  No lesions or masses  Vagina: Trace blood in the vault.    Cervix: Normal appearing, no lesions. Cervix approximately 2.5cm  Uterus: Small, mobile, no parametrial involvement or nodularity.  Adnexa: No palpable masses. Rectal  Good tone,  no masses no cul de sac nodularity.  Extremities  No bilateral cyanosis, clubbing or edema.   Laurette Schimke, MD, PhD 02/11/2013, 1:29 PM

## 2013-02-12 ENCOUNTER — Encounter: Payer: Self-pay | Admitting: Family

## 2013-02-14 ENCOUNTER — Encounter (HOSPITAL_COMMUNITY): Payer: Self-pay | Admitting: Pharmacy Technician

## 2013-02-17 ENCOUNTER — Telehealth: Payer: Self-pay | Admitting: Gynecologic Oncology

## 2013-02-17 ENCOUNTER — Ambulatory Visit (HOSPITAL_COMMUNITY)
Admission: RE | Admit: 2013-02-17 | Discharge: 2013-02-17 | Disposition: A | Discharge status: Home / Self Care | Payer: Medicare HMO | Source: Ambulatory Visit | Attending: Gynecologic Oncology | Admitting: Gynecologic Oncology

## 2013-02-17 ENCOUNTER — Encounter (HOSPITAL_COMMUNITY)
Admission: RE | Admit: 2013-02-17 | Discharge: 2013-02-17 | Disposition: A | Discharge status: Home / Self Care | Payer: Medicare HMO | Source: Ambulatory Visit | Attending: Gynecology | Admitting: Gynecology

## 2013-02-17 ENCOUNTER — Encounter (HOSPITAL_COMMUNITY): Payer: Self-pay

## 2013-02-17 DIAGNOSIS — N840 Polyp of corpus uteri: Secondary | ICD-10-CM

## 2013-02-17 HISTORY — DX: Polyp of corpus uteri: N84.0

## 2013-02-17 HISTORY — PX: CATARACT EXTRACTION, BILATERAL: SHX1313

## 2013-02-17 LAB — COMPREHENSIVE METABOLIC PANEL
Albumin: 4.1 g/dL (ref 3.5–5.2)
Alkaline Phosphatase: 72 U/L (ref 39–117)
BUN: 22 mg/dL (ref 6–23)
Calcium: 9.4 mg/dL (ref 8.4–10.5)
Chloride: 100 mEq/L (ref 96–112)
Glucose, Bld: 105 mg/dL — ABNORMAL HIGH (ref 70–99)
Potassium: 4 mEq/L (ref 3.5–5.1)
Sodium: 138 mEq/L (ref 135–145)
Total Bilirubin: 0.4 mg/dL (ref 0.3–1.2)

## 2013-02-17 LAB — CBC WITH DIFFERENTIAL/PLATELET
Basophils Absolute: 0 10*3/uL (ref 0.0–0.1)
Basophils Relative: 0 % (ref 0–1)
Eosinophils Absolute: 0.4 10*3/uL (ref 0.0–0.7)
Hemoglobin: 12 g/dL (ref 12.0–15.0)
Lymphs Abs: 2.3 10*3/uL (ref 0.7–4.0)
MCH: 28.8 pg (ref 26.0–34.0)
Monocytes Relative: 5 % (ref 3–12)
Neutro Abs: 4.6 10*3/uL (ref 1.7–7.7)
Neutrophils Relative %: 60 % (ref 43–77)
Platelets: 220 10*3/uL (ref 150–400)
RBC: 4.17 MIL/uL (ref 3.87–5.11)
RDW: 14.3 % (ref 11.5–15.5)
WBC: 7.6 10*3/uL (ref 4.0–10.5)

## 2013-02-17 LAB — URINALYSIS, ROUTINE W REFLEX MICROSCOPIC
Bilirubin Urine: NEGATIVE
Glucose, UA: NEGATIVE mg/dL
Protein, ur: NEGATIVE mg/dL
Specific Gravity, Urine: 1.025 (ref 1.005–1.030)
Urobilinogen, UA: 0.2 mg/dL (ref 0.0–1.0)
pH: 6 (ref 5.0–8.0)

## 2013-02-17 LAB — URINE MICROSCOPIC-ADD ON

## 2013-02-17 NOTE — Patient Instructions (Signed)
18 Kara Huynh  02/17/2013   Your procedure is scheduled on:   02-18-2013  Report to [facility] at       0730 AM.  Call this number if you have problems the morning of surgery: [phone]  Or Presurgical Testing [phone]([name])   Remember: Follow any bowel prep instructions per MD office.(Clear liquid diet x 24 hours) Use 1 Adult Fleet enema night before at bedtime.   Do not eat food:After Midnight.    Clear liquids include soda, tea, black coffee, apple or grape juice, broth.  Take these medicines the morning of surgery with A SIP OF WATER: Levothyroxine. Take no diabetic meds AM of.   Do not wear jewelry, make-up or nail polish.  Do not wear lotions, powders, or perfumes. You may wear deodorant.  Do not shave 12 hours prior to first CHG shower(legs and under arms).(face and neck okay.)  Do not bring valuables to the hospital.  Contacts, dentures or removable bridgework, body piercing, hair pins may not be worn into surgery.  Leave suitcase in the car. After surgery it may be brought to your room.  For patients admitted to the hospital, checkout time is 11:00 AM the day of discharge.   Patients discharged the day of surgery will not be allowed to drive home. Must have responsible person with you x 24 hours once discharged.  Name and phone number of your driver: [name] -spouse [phone] Cell  Special Instructions: CHG(Chlorhedine 4%-"Hibiclens","Betasept","Aplicare") Shower Use Special Wash: see special instructions.(avoid face and genitals)   Please read over the following fact sheets that you were given:  Blood Transfusion fact sheet, Incentive Spirometry Instruction.  Remember : Type/Screen "Blue armbands" - may not be removed once applied(would result in being retested if removed).  Failure to follow these instructions may result in Cancellation of your surgery.   Patient signature_______________________________________________________

## 2013-02-17 NOTE — Telephone Encounter (Signed)
Called to check on patient's pre-operative status.  Message left.  She is to have her pre-op appointment today at 2:30pm.  She is advised to call the office for any questions or concerns.

## 2013-02-17 NOTE — Pre-Procedure Instructions (Addendum)
02-17-13 CXR done today. EKG 5'14-Epic. 02-17-13 Chart to Short Stay -pending CXR,Urinalysis, and T/S -results per Kara Huynh.Kara Huynh

## 2013-02-17 NOTE — Pre-Procedure Instructions (Deleted)
20 JELITZA MANNINEN  02/17/2013   Your procedure is scheduled on:   -2014  Report to [facility] at        AM / PM.  Call this number if you have problems the morning of surgery: [phone]  Or Presurgical Testing [phone]([place])   Remember: Follow any bowel prep instructions per MD office. For Cpap use: Bring mask and tubing only.   Do not eat food:After Midnight.  May have clear liquids:up to 6 Hours before arrival. Nothing after :  Clear liquids include soda, tea, black coffee, apple or grape juice, broth.  Take these medicines the morning of surgery with A SIP OF WATER:    Do not wear jewelry, make-up or nail polish.  Do not wear lotions, powders, or perfumes. You may wear deodorant.  Do not shave 12 hours prior to first CHG shower(legs and under arms).(face and neck okay.)  Do not bring valuables to the hospital.  Contacts, dentures or removable bridgework, body piercing, hair pins may not be worn into surgery.  Leave suitcase in the car. After surgery it may be brought to your room.  For patients admitted to the hospital, checkout time is 11:00 AM the day of discharge.   Patients discharged the day of surgery will not be allowed to drive home. Must have responsible person with you x 24 hours once discharged.  Name and phone number of your driver:   Special Instructions: CHG(Chlorhedine 4%-"Hibiclens","Betasept","Aplicare") Shower Use Special Wash: see special instructions.(avoid face and genitals)   Please read over the following fact sheets that you were given: MRSA Information, Blood Transfusion fact sheet, Incentive Spirometry Instruction.  Remember : Type/Screen "Blue armbands" - may not be removed once applied(would result in being retested if removed).  Failure to follow these instructions may result in Cancellation of your surgery.   Patient signature_______________________________________________________

## 2013-02-18 ENCOUNTER — Encounter (HOSPITAL_COMMUNITY): Payer: Self-pay | Admitting: *Deleted

## 2013-02-18 ENCOUNTER — Encounter (HOSPITAL_COMMUNITY): Payer: Medicare HMO | Admitting: Anesthesiology

## 2013-02-18 ENCOUNTER — Ambulatory Visit: Payer: Medicare HMO | Admitting: Gynecologic Oncology

## 2013-02-18 ENCOUNTER — Inpatient Hospital Stay (HOSPITAL_COMMUNITY)
Admission: RE | Admit: 2013-02-18 | Discharge: 2013-02-21 | DRG: 741 | Disposition: A | Discharge status: Home / Self Care | Payer: Medicare HMO | Source: Ambulatory Visit | Attending: Obstetrics & Gynecology | Admitting: Obstetrics & Gynecology

## 2013-02-18 ENCOUNTER — Encounter (HOSPITAL_COMMUNITY)
Admission: RE | Disposition: A | Discharge status: Home / Self Care | Payer: Self-pay | Source: Ambulatory Visit | Attending: Obstetrics & Gynecology

## 2013-02-18 ENCOUNTER — Inpatient Hospital Stay (HOSPITAL_COMMUNITY): Payer: Medicare HMO | Admitting: Anesthesiology

## 2013-02-18 DIAGNOSIS — E039 Hypothyroidism, unspecified: Secondary | ICD-10-CM | POA: Diagnosis present

## 2013-02-18 DIAGNOSIS — Z833 Family history of diabetes mellitus: Secondary | ICD-10-CM

## 2013-02-18 DIAGNOSIS — Z8249 Family history of ischemic heart disease and other diseases of the circulatory system: Secondary | ICD-10-CM

## 2013-02-18 DIAGNOSIS — C541 Malignant neoplasm of endometrium: Secondary | ICD-10-CM | POA: Diagnosis present

## 2013-02-18 DIAGNOSIS — F329 Major depressive disorder, single episode, unspecified: Secondary | ICD-10-CM | POA: Diagnosis present

## 2013-02-18 DIAGNOSIS — Z87891 Personal history of nicotine dependence: Secondary | ICD-10-CM

## 2013-02-18 DIAGNOSIS — E785 Hyperlipidemia, unspecified: Secondary | ICD-10-CM | POA: Diagnosis present

## 2013-02-18 DIAGNOSIS — C549 Malignant neoplasm of corpus uteri, unspecified: Principal | ICD-10-CM | POA: Diagnosis present

## 2013-02-18 DIAGNOSIS — Z800 Family history of malignant neoplasm of digestive organs: Secondary | ICD-10-CM

## 2013-02-18 DIAGNOSIS — F3289 Other specified depressive episodes: Secondary | ICD-10-CM | POA: Diagnosis present

## 2013-02-18 DIAGNOSIS — M129 Arthropathy, unspecified: Secondary | ICD-10-CM | POA: Diagnosis present

## 2013-02-18 DIAGNOSIS — F411 Generalized anxiety disorder: Secondary | ICD-10-CM | POA: Diagnosis present

## 2013-02-18 DIAGNOSIS — I10 Essential (primary) hypertension: Secondary | ICD-10-CM | POA: Diagnosis present

## 2013-02-18 DIAGNOSIS — Z823 Family history of stroke: Secondary | ICD-10-CM

## 2013-02-18 DIAGNOSIS — Z01812 Encounter for preprocedural laboratory examination: Secondary | ICD-10-CM

## 2013-02-18 DIAGNOSIS — N736 Female pelvic peritoneal adhesions (postinfective): Secondary | ICD-10-CM | POA: Diagnosis present

## 2013-02-18 DIAGNOSIS — Z96659 Presence of unspecified artificial knee joint: Secondary | ICD-10-CM

## 2013-02-18 DIAGNOSIS — E119 Type 2 diabetes mellitus without complications: Secondary | ICD-10-CM | POA: Diagnosis present

## 2013-02-18 HISTORY — PX: LAPAROTOMY: SHX154

## 2013-02-18 LAB — TYPE AND SCREEN: ABO/RH(D): O POS

## 2013-02-18 LAB — GLUCOSE, CAPILLARY
Glucose-Capillary: 114 mg/dL — ABNORMAL HIGH (ref 70–99)
Glucose-Capillary: 117 mg/dL — ABNORMAL HIGH (ref 70–99)
Glucose-Capillary: 133 mg/dL — ABNORMAL HIGH (ref 70–99)

## 2013-02-18 SURGERY — LAPAROTOMY, EXPLORATORY
Anesthesia: General | Laterality: Bilateral

## 2013-02-18 MED ORDER — INSULIN ASPART 100 UNIT/ML ~~LOC~~ SOLN
0.0000 [IU] | SUBCUTANEOUS | Status: DC
  Administered 2013-02-18 – 2013-02-19 (×2): 2 [IU] via SUBCUTANEOUS

## 2013-02-18 MED ORDER — HYDROMORPHONE HCL PF 1 MG/ML IJ SOLN
0.2500 mg | INTRAMUSCULAR | Status: DC | PRN
  Administered 2013-02-18 (×3): 0.5 mg via INTRAVENOUS

## 2013-02-18 MED ORDER — HEPARIN SODIUM (PORCINE) 1000 UNIT/ML IJ SOLN
INTRAMUSCULAR | Status: AC
  Filled 2013-02-18: qty 1

## 2013-02-18 MED ORDER — KCL IN DEXTROSE-NACL 20-5-0.45 MEQ/L-%-% IV SOLN
INTRAVENOUS | Status: AC
  Administered 2013-02-18: 1000 mL
  Filled 2013-02-18: qty 1000

## 2013-02-18 MED ORDER — HYDROMORPHONE HCL PF 1 MG/ML IJ SOLN
INTRAMUSCULAR | Status: AC
  Filled 2013-02-18: qty 1

## 2013-02-18 MED ORDER — PROMETHAZINE HCL 25 MG/ML IJ SOLN
6.2500 mg | INTRAMUSCULAR | Status: DC | PRN
  Administered 2013-02-18: 6.25 mg via INTRAVENOUS

## 2013-02-18 MED ORDER — CEFAZOLIN SODIUM-DEXTROSE 2-3 GM-% IV SOLR
INTRAVENOUS | Status: AC
  Filled 2013-02-18: qty 50

## 2013-02-18 MED ORDER — PROPOFOL 10 MG/ML IV BOLUS
INTRAVENOUS | Status: AC
  Filled 2013-02-18: qty 20

## 2013-02-18 MED ORDER — FENTANYL CITRATE 0.05 MG/ML IJ SOLN
INTRAMUSCULAR | Status: AC
  Filled 2013-02-18: qty 5

## 2013-02-18 MED ORDER — ONDANSETRON HCL 4 MG PO TABS: 4.0000 mg | ORAL_TABLET | Freq: Four times a day (QID) | ORAL | Status: DC | PRN

## 2013-02-18 MED ORDER — MIDAZOLAM HCL 5 MG/5ML IJ SOLN
INTRAMUSCULAR | Status: DC | PRN
  Administered 2013-02-18: 2 mg via INTRAVENOUS

## 2013-02-18 MED ORDER — SODIUM CHLORIDE 0.9 % IJ SOLN
INTRAMUSCULAR | Status: DC | PRN
  Administered 2013-02-18: 12:00:00

## 2013-02-18 MED ORDER — LIDOCAINE HCL (CARDIAC) 20 MG/ML IV SOLN: INTRAVENOUS | Status: DC | PRN

## 2013-02-18 MED ORDER — DEXTROSE-NACL 5-0.45 % IV SOLN: INTRAVENOUS | Status: DC

## 2013-02-18 MED ORDER — SERTRALINE HCL 50 MG PO TABS
150.0000 mg | ORAL_TABLET | Freq: Every day | ORAL | Status: DC
  Administered 2013-02-18 – 2013-02-20 (×3): 150 mg via ORAL
  Filled 2013-02-18 (×4): qty 1

## 2013-02-18 MED ORDER — BUPIVACAINE LIPOSOME 1.3 % IJ SUSP
20.0000 mL | Freq: Once | INTRAMUSCULAR | Status: DC
  Filled 2013-02-18: qty 20

## 2013-02-18 MED ORDER — TRAMADOL HCL 50 MG PO TABS
100.0000 mg | ORAL_TABLET | Freq: Four times a day (QID) | ORAL | Status: DC
  Administered 2013-02-18 – 2013-02-21 (×11): 100 mg via ORAL
  Filled 2013-02-18 (×11): qty 2

## 2013-02-18 MED ORDER — GLYCOPYRROLATE 0.2 MG/ML IJ SOLN
INTRAMUSCULAR | Status: AC
  Filled 2013-02-18: qty 4

## 2013-02-18 MED ORDER — LEVOTHYROXINE SODIUM 25 MCG PO TABS
25.0000 ug | ORAL_TABLET | Freq: Every day | ORAL | Status: DC
  Administered 2013-02-19 – 2013-02-21 (×3): 25 ug via ORAL
  Filled 2013-02-18 (×5): qty 1

## 2013-02-18 MED ORDER — LISINOPRIL 10 MG PO TABS
10.0000 mg | ORAL_TABLET | Freq: Every day | ORAL | Status: DC
  Administered 2013-02-18: 10 mg via ORAL
  Filled 2013-02-18 (×2): qty 1

## 2013-02-18 MED ORDER — MIDAZOLAM HCL 2 MG/2ML IJ SOLN
INTRAMUSCULAR | Status: AC
  Filled 2013-02-18: qty 2

## 2013-02-18 MED ORDER — SODIUM CHLORIDE 0.9 % IJ SOLN
INTRAMUSCULAR | Status: AC
  Administered 2013-02-18: 10 mL
  Filled 2013-02-18: qty 10

## 2013-02-18 MED ORDER — ONDANSETRON HCL 4 MG/2ML IJ SOLN: 4.0000 mg | Freq: Four times a day (QID) | INTRAMUSCULAR | Status: DC | PRN

## 2013-02-18 MED ORDER — SODIUM CHLORIDE 0.9 % IV BOLUS (SEPSIS): 1000.0000 mL | Freq: Once | INTRAVENOUS | Status: DC

## 2013-02-18 MED ORDER — ENOXAPARIN SODIUM 40 MG/0.4ML ~~LOC~~ SOLN
40.0000 mg | SUBCUTANEOUS | Status: AC
  Administered 2013-02-18: 40 mg via SUBCUTANEOUS
  Filled 2013-02-18: qty 0.4

## 2013-02-18 MED ORDER — LACTATED RINGERS IV SOLN
INTRAVENOUS | Status: DC | PRN
  Administered 2013-02-18 (×2): via INTRAVENOUS

## 2013-02-18 MED ORDER — ROCURONIUM BROMIDE 100 MG/10ML IV SOLN
INTRAVENOUS | Status: AC
  Filled 2013-02-18: qty 1

## 2013-02-18 MED ORDER — GLYCOPYRROLATE 0.2 MG/ML IJ SOLN
INTRAMUSCULAR | Status: DC | PRN
  Administered 2013-02-18: .8 mg via INTRAVENOUS

## 2013-02-18 MED ORDER — LACTATED RINGERS IV SOLN: INTRAVENOUS | Status: DC

## 2013-02-18 MED ORDER — ONDANSETRON HCL 4 MG/2ML IJ SOLN
INTRAMUSCULAR | Status: AC
  Filled 2013-02-18: qty 2

## 2013-02-18 MED ORDER — GLUCERNA SHAKE PO LIQD
237.0000 mL | Freq: Two times a day (BID) | ORAL | Status: DC
  Administered 2013-02-19 – 2013-02-21 (×5): 237 mL via ORAL
  Filled 2013-02-18 (×7): qty 237

## 2013-02-18 MED ORDER — ROCURONIUM BROMIDE 100 MG/10ML IV SOLN
INTRAVENOUS | Status: DC | PRN
  Administered 2013-02-18: 50 mg via INTRAVENOUS

## 2013-02-18 MED ORDER — PHENYLEPHRINE HCL 10 MG/ML IJ SOLN
INTRAMUSCULAR | Status: AC
  Filled 2013-02-18: qty 1

## 2013-02-18 MED ORDER — 0.9 % SODIUM CHLORIDE (POUR BTL) OPTIME
TOPICAL | Status: DC | PRN
  Administered 2013-02-18: 2000 mL

## 2013-02-18 MED ORDER — ZOLPIDEM TARTRATE 5 MG PO TABS: 5.0000 mg | ORAL_TABLET | Freq: Every evening | ORAL | Status: DC | PRN

## 2013-02-18 MED ORDER — FENTANYL CITRATE 0.05 MG/ML IJ SOLN
INTRAMUSCULAR | Status: DC | PRN
  Administered 2013-02-18: 25 ug via INTRAVENOUS
  Administered 2013-02-18: 100 ug via INTRAVENOUS
  Administered 2013-02-18: 50 ug via INTRAVENOUS
  Administered 2013-02-18: 25 ug via INTRAVENOUS
  Administered 2013-02-18: 50 ug via INTRAVENOUS

## 2013-02-18 MED ORDER — ONDANSETRON HCL 4 MG/2ML IJ SOLN
INTRAMUSCULAR | Status: DC | PRN
  Administered 2013-02-18: 4 mg via INTRAVENOUS

## 2013-02-18 MED ORDER — SODIUM CHLORIDE 0.9 % IJ SOLN
INTRAMUSCULAR | Status: AC
  Filled 2013-02-18: qty 20

## 2013-02-18 MED ORDER — OXYCODONE HCL 5 MG PO TABS
10.0000 mg | ORAL_TABLET | ORAL | Status: DC | PRN
  Administered 2013-02-18 – 2013-02-20 (×6): 10 mg via ORAL
  Filled 2013-02-18 (×7): qty 2

## 2013-02-18 MED ORDER — PHENYLEPHRINE 40 MCG/ML (10ML) SYRINGE FOR IV PUSH (FOR BLOOD PRESSURE SUPPORT)
PREFILLED_SYRINGE | INTRAVENOUS | Status: AC
  Filled 2013-02-18: qty 10

## 2013-02-18 MED ORDER — DEXTROSE-NACL 5-0.45 % IV SOLN
INTRAVENOUS | Status: DC
  Administered 2013-02-19: 12:00:00 via INTRAVENOUS

## 2013-02-18 MED ORDER — NEOSTIGMINE METHYLSULFATE 1 MG/ML IJ SOLN
INTRAMUSCULAR | Status: DC | PRN
  Administered 2013-02-18: 5 mg via INTRAVENOUS

## 2013-02-18 MED ORDER — PROMETHAZINE HCL 25 MG/ML IJ SOLN
INTRAMUSCULAR | Status: AC
  Filled 2013-02-18: qty 1

## 2013-02-18 MED ORDER — KCL IN DEXTROSE-NACL 20-5-0.45 MEQ/L-%-% IV SOLN
INTRAVENOUS | Status: DC
  Filled 2013-02-18: qty 1000

## 2013-02-18 MED ORDER — HYDROMORPHONE HCL PF 1 MG/ML IJ SOLN
INTRAMUSCULAR | Status: DC | PRN
  Administered 2013-02-18 (×2): 0.5 mg via INTRAVENOUS

## 2013-02-18 MED ORDER — INSULIN GLARGINE 100 UNIT/ML ~~LOC~~ SOLN
18.0000 [IU] | Freq: Every day | SUBCUTANEOUS | Status: DC
  Administered 2013-02-19 – 2013-02-20 (×2): 18 [IU] via SUBCUTANEOUS
  Filled 2013-02-18 (×5): qty 0.18

## 2013-02-18 MED ORDER — PHENYLEPHRINE HCL 10 MG/ML IJ SOLN
INTRAMUSCULAR | Status: DC | PRN
  Administered 2013-02-18 (×2): 40 ug via INTRAVENOUS

## 2013-02-18 MED ORDER — ATORVASTATIN CALCIUM 20 MG PO TABS
20.0000 mg | ORAL_TABLET | Freq: Every day | ORAL | Status: DC
  Administered 2013-02-18 – 2013-02-20 (×3): 20 mg via ORAL
  Filled 2013-02-18 (×4): qty 1

## 2013-02-18 MED ORDER — ENOXAPARIN SODIUM 40 MG/0.4ML ~~LOC~~ SOLN
40.0000 mg | SUBCUTANEOUS | Status: DC
  Administered 2013-02-19 – 2013-02-21 (×3): 40 mg via SUBCUTANEOUS
  Filled 2013-02-18 (×4): qty 0.4

## 2013-02-18 MED ORDER — SODIUM CHLORIDE 0.9 % IV BOLUS (SEPSIS)
500.0000 mL | Freq: Once | INTRAVENOUS | Status: AC
  Administered 2013-02-18: 500 mL via INTRAVENOUS

## 2013-02-18 MED ORDER — HYDROMORPHONE HCL PF 2 MG/ML IJ SOLN
INTRAMUSCULAR | Status: AC
  Filled 2013-02-18: qty 1

## 2013-02-18 MED ORDER — CEFAZOLIN SODIUM-DEXTROSE 2-3 GM-% IV SOLR
2.0000 g | INTRAVENOUS | Status: AC
  Administered 2013-02-18: 2 g via INTRAVENOUS

## 2013-02-18 MED ORDER — ACETAMINOPHEN 500 MG PO TABS
1000.0000 mg | ORAL_TABLET | Freq: Four times a day (QID) | ORAL | Status: DC
  Administered 2013-02-18 – 2013-02-21 (×10): 1000 mg via ORAL
  Filled 2013-02-18 (×16): qty 2

## 2013-02-18 MED ORDER — MAGNESIUM HYDROXIDE 400 MG/5ML PO SUSP
30.0000 mL | Freq: Three times a day (TID) | ORAL | Status: AC
  Administered 2013-02-18 – 2013-02-19 (×3): 30 mL via ORAL
  Filled 2013-02-18 (×3): qty 30

## 2013-02-18 MED ORDER — PROPOFOL 10 MG/ML IV BOLUS
INTRAVENOUS | Status: DC | PRN
  Administered 2013-02-18: 150 mg via INTRAVENOUS

## 2013-02-18 MED ORDER — HYDROMORPHONE HCL PF 1 MG/ML IJ SOLN: 0.5000 mg | INTRAMUSCULAR | Status: DC | PRN

## 2013-02-18 SURGICAL SUPPLY — 47 items
ATTRACTOMAT 16X20 MAGNETIC DRP (DRAPES) ×2 IMPLANT
BAG URINE DRAINAGE (UROLOGICAL SUPPLIES) IMPLANT
CANISTER SUCTION 2500CC (MISCELLANEOUS) IMPLANT
CHLORAPREP W/TINT 26ML (MISCELLANEOUS) ×2 IMPLANT
CLIP TI MEDIUM LARGE 6 (CLIP) IMPLANT
CONT SPEC 4OZ CLIKSEAL STRL BL (MISCELLANEOUS) ×2 IMPLANT
COVER SURGICAL LIGHT HANDLE (MISCELLANEOUS) ×2 IMPLANT
DRAPE UTILITY XL STRL (DRAPES) ×2 IMPLANT
DRAPE WARM FLUID 44X44 (DRAPE) ×2 IMPLANT
DRESSING TELFA ISLAND 4X8 (GAUZE/BANDAGES/DRESSINGS) ×2 IMPLANT
DRSG COVADERM 4X8 (GAUZE/BANDAGES/DRESSINGS) ×2 IMPLANT
ELECT BLADE 6.5 EXT (BLADE) ×2 IMPLANT
ELECT REM PT RETURN 9FT ADLT (ELECTROSURGICAL) ×2
ELECTRODE REM PT RTRN 9FT ADLT (ELECTROSURGICAL) ×1 IMPLANT
GAUZE SPONGE 4X4 16PLY XRAY LF (GAUZE/BANDAGES/DRESSINGS) IMPLANT
GLOVE BIOGEL M STRL SZ7.5 (GLOVE) ×4 IMPLANT
GLOVE BIOGEL PI IND STRL 6.5 (GLOVE) ×1 IMPLANT
GLOVE BIOGEL PI INDICATOR 6.5 (GLOVE) ×1
GOWN PREVENTION PLUS LG XLONG (DISPOSABLE) IMPLANT
GOWN STRL NON-REIN LRG LVL3 (GOWN DISPOSABLE) ×2 IMPLANT
GOWN STRL REIN 2XL XLG LVL4 (GOWN DISPOSABLE) ×2 IMPLANT
GOWN STRL REIN XL XLG (GOWN DISPOSABLE) ×2 IMPLANT
KIT BASIN OR (CUSTOM PROCEDURE TRAY) ×2 IMPLANT
LIGASURE IMPACT 36 18CM CVD LR (INSTRUMENTS) IMPLANT
NS IRRIG 1000ML POUR BTL (IV SOLUTION) IMPLANT
PACK GENERAL/GYN (CUSTOM PROCEDURE TRAY) ×2 IMPLANT
SHEET LAVH (DRAPES) ×2 IMPLANT
SPONGE GAUZE 4X4 12PLY (GAUZE/BANDAGES/DRESSINGS) IMPLANT
SPONGE LAP 18X18 X RAY DECT (DISPOSABLE) ×2 IMPLANT
STAPLER SKIN PROX WIDE 3.9 (STAPLE) ×2 IMPLANT
SUT ETHILON 1 LR 30 (SUTURE) IMPLANT
SUT PDS AB 1 CTXB1 36 (SUTURE) ×4 IMPLANT
SUT SILK 2 0 (SUTURE)
SUT SILK 2 0 30  PSL (SUTURE)
SUT SILK 2 0 30 PSL (SUTURE) IMPLANT
SUT SILK 2-0 18XBRD TIE 12 (SUTURE) IMPLANT
SUT VIC AB 0 CT1 36 (SUTURE) ×8 IMPLANT
SUT VIC AB 2-0 CT2 27 (SUTURE) ×14 IMPLANT
SUT VIC AB 2-0 SH 27 (SUTURE) ×2
SUT VIC AB 2-0 SH 27X BRD (SUTURE) ×2 IMPLANT
SUT VIC AB 3-0 CTX 36 (SUTURE) ×2 IMPLANT
SUT VICRYL 2 0 18  UND BR (SUTURE) ×1
SUT VICRYL 2 0 18 UND BR (SUTURE) ×1 IMPLANT
TOWEL OR 17X26 10 PK STRL BLUE (TOWEL DISPOSABLE) ×4 IMPLANT
TOWEL OR NON WOVEN STRL DISP B (DISPOSABLE) IMPLANT
TRAY FOLEY CATH 14FRSI W/METER (CATHETERS) ×2 IMPLANT
WATER STERILE IRR 1500ML POUR (IV SOLUTION) IMPLANT

## 2013-02-18 NOTE — Progress Notes (Signed)
Utilization review completed.  

## 2013-02-18 NOTE — Interval H&P Note (Signed)
History and Physical Interval Note:  02/18/2013 9:30 AM  Kara Huynh  has presented today for surgery, with the diagnosis of ENDOMETRIAL CANCER   The various methods of treatment have been discussed with the patient and family. After consideration of risks, benefits and other options for treatment, the patient has consented to  Procedure(s): EXPLORATORY LAPAROTOMY TOTAL ABDMONIAL HYSTERECTOMY BILATERAL SALPINGO OOPHORECTOMY POSSIBLE LYMPH NODE DISSECTION  (Bilateral) as a surgical intervention .  The patient's history has been reviewed, patient examined, no change in status, stable for surgery.  I have reviewed the patient's chart and labs.  Questions were answered to the patient's satisfaction.     CLARKE-PEARSON,Shahed Yeoman L

## 2013-02-18 NOTE — H&P (View-Only) (Signed)
Consult Note: Gyn-Onc  Consult was requested by Dr. Adkins for the evaluation of Kara Huynh 67 y.o. female  CC:  Chief Complaint  Patient presents with  . Endometrial cancer    New consult    Assessment/Plan:  [name]  is a 67 y.o.  year old.  A detailed discussion was held with the patient and her family with regard to her endometrial cancer diagnosis. We discussed the standard management options for uterine cancer which includes surgery followed possibly by adjuvant therapy depending on the results of surgery. The options for surgical management include a hysterectomy and removal of the tubes and ovaries possibly with removal of pelvic and para-aortic lymph nodes. A minimally invasive approach including a robotic hysterectomy or laparoscopic hysterectomy have benefits including shorter hospital stay, recovery time and better wound healing. The alternative approach is an open hysterectomy. The patient has been counseled about these surgical options and the risks of the procedure which include but are not limited to infection, bleeding, damage to adjacent organs, nerves, vessels and clot formation and she is willing to proceed with open endometrial cancer staging with Dr. Daniel Clarke Pearson on 02/18/2013.   All of her questions have been answered.    Given the family history of first-degree relative with colon cancer the plan is to assess for pathological MS IV IHC staining of the endometrial cancer specimen.  HPI: [name]  is a 67 y.o.  year old who noted the onset of vaginal bleeding in August of 2014. She reports hormonal therapy for approximately one year after menopause. A pelvic sonohysterogram on 01/29/2013 demonstrated a uterine length of 6.17 cm. Endometrial thickness was 0.24 cm. The cavity was filled with fluid and there was a 17 mm density seen within it. No right adnexal masses were appreciated. Left ovary was noted have a 14 x 11 mm simple cyst without  blood flow. There was no free fluid. 01/31/2013 she underwent hysteroscopy D&C. 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  Her history is notable for a father with colon cancer diagnosed after the age of 50. She reports a colonoscopy in the remote past and was within normal limits.   She reports occasional spotting nausea and fatigue.   Current Meds:  Outpatient Encounter Prescriptions as of 02/11/2013  Medication Sig  . atorvastatin (LIPITOR) 20 MG tablet TAKE 1 TABLET (20 MG TOTAL) BY MOUTH DAILY.  . Cholecalciferol (VITAMIN D) 2000 UNITS CAPS Take 1 capsule by mouth daily.   . Cyanocobalamin 6000 MCG SUBL Place 1 tablet under the tongue daily.   . levothyroxine (SYNTHROID) 25 MCG tablet Take 1 tablet (25 mcg total) by mouth daily before breakfast.  . lisinopril (PRINIVIL,ZESTRIL) 20 MG tablet Take 0.5 tablets (10 mg total) by mouth daily.  . meloxicam (MOBIC) 15 MG tablet TAKE 1 TABLET (15 MG TOTAL) BY MOUTH DAILY.  . metFORMIN (GLUCOPHAGE) 500 MG tablet Take 1 tablet (500 mg total) by mouth 2 (two) times daily with a meal.  . sertraline (ZOLOFT) 100 MG tablet Take 1.5 tablets (150 mg total) by mouth daily.  . HYDROcodone-ibuprofen (VICOPROFEN) 7.5-200 MG per tablet Take 1 tablet by mouth every 6 (six) hours as needed for moderate pain.    Allergy: No Known Allergies  Social Hx:   History   Social History  . Marital Status: Married    Spouse Name: N/A    Number of   Children: N/A  . Years of Education: N/A   Occupational History  . Not on file.   Social History Main Topics  . Smoking status: Former Smoker    Quit date: 07/04/2009  . Smokeless tobacco: Not on file  . Alcohol Use: Yes     Comment: twice monthly  . Drug Use: No  . Sexual Activity: Yes    Partners: Male    Birth Control/ Protection: Post-menopausal   Other Topics Concern  . Not on file   Social History  Narrative  . No narrative on file    Past Surgical Hx:  Past Surgical History  Procedure Laterality Date  . Total knee arthroplasty Bilateral   . Breast surgery      biopsy  . Laparoscopy for ectopic pregnancy  1977    SALPINGOSTOMY (SIDE UNKNOWN)  . Appendectomy    . Dilitation & currettage/hystroscopy with versapoint resection N/A 01/31/2013    Procedure: DILATATION & CURETTAGE/HYSTEROSCOPY WITH CERVICAL BLOCK;  Surgeon: Gretchen Adkins, MD;  Location: [facility];  Service: Gynecology;  Laterality: N/A;    Past Medical Hx:  Past Medical History  Diagnosis Date  . Hyperlipidemia   . Hypertension   . Anxiety   . Depression   . Hypothyroidism   . Type 2 diabetes mellitus   . Arthritis   . Endometrial polyp     Past Gynecological History:  G1 P0 extopic x 1, Menarche 12, regular menses, menopause 20 years ago.   No LMP recorded. Patient is postmenopausal.  No h/o abnormal pap.  Family Hx:  Family History  Problem Relation Age of Onset  . Arthritis Mother   . Mental illness Mother   . Diabetes Mother   . Cancer Father     colon, lung, and prostate  . Arthritis Father   . Hyperlipidemia Father   . Stroke Father   . Heart disease Father   . Hypertension Father     Review of Systems: Constitutional  Feels well, reports fatigue Cardiovascular  No chest pain, shortness of breath, or edema  Pulmonary  No cough or wheeze.  Gastro Intestinal  Nausea in the am relieved with eating, no vomitting, or diarrhoea. Good appetite. No bright red blood per rectum, no abdominal pain, change in bowel movement, or constipation.  Genito Urinary  No frequency, urgency, dysuria,reports bleeding,  Musculo Skeletal  No myalgia, arthralgia, joint swelling or pain  Neurologic  No weakness, numbness, change in gait,  Psychology  No depression, anxiety, insomnia.   Vitals:  Blood pressure 116/60, pulse 75, temperature 98.6 F (37 C), temperature source Oral, resp. rate 20, height 5'  4.17" (1.63 m), weight 209 lb 3.2 oz (94.892 kg).  Physical Exam: WD in NAD, poor short term memory Neck  Supple NROM, without any enlargements.  Lymph Node Survey No cervical supraclavicular or inguinal adenopathy Cardiovascular  Pulse normal rate, regularity and rhythm. S1 and S2 normal.  Lungs  Clear to auscultation bilateraly, without wheezes/crackles/rhonchi. Good air movement.  Skin  No rash/lesions/breakdown  Psychiatry  Alert and oriented to person, place, and time  Abdomen  Normoactive bowel sounds, abdomen soft, non-tender and obese.  Back No CVA tenderness Genito Urinary  Vulva/vagina: Normal external female genitalia.  No lesions.    Bladder/urethra:  No lesions or masses  Vagina: Trace blood in the vault.    Cervix: Normal appearing, no lesions. Cervix approximately 2.5cm  Uterus: Small, mobile, no parametrial involvement or nodularity.  Adnexa: No palpable masses. Rectal  Good tone,   no masses no cul de sac nodularity.  Extremities  No bilateral cyanosis, clubbing or edema.   Nazir Hacker, MD, PhD 02/11/2013, 1:29 PM    

## 2013-02-18 NOTE — Plan of Care (Signed)
Problem: Phase II Progression Outcomes Goal: Tolerating diet Outcome: Progressing Clear liquids     

## 2013-02-18 NOTE — Progress Notes (Signed)
Dr Tamela Oddi paged with patient blood pressure.  Blood Pressure 80/50, 77, 16, 95% 2 LNC, 97.6. Patient is awake sitting up in bed.  Denies any dizziness states she has no pain,  Abdomen visualized minimal staining on patient dressing,  Abdomen soft and no increased distention.  Orders received for H&H stat, give 500 cc bolus, and hold blood pressure medications.  Will page MD with results.

## 2013-02-18 NOTE — Anesthesia Postprocedure Evaluation (Signed)
Anesthesia Post Note  Patient: Kara Huynh  Procedure(s) Performed: Procedure(s) (LRB): EXPLORATORY LAPAROTOMY TOTAL ABDMONIAL HYSTERECTOMY BILATERAL SALPINGO OOPHORECTOMY  (Bilateral)  Anesthesia type: General  Patient location: PACU  Post pain: Pain level controlled  Post assessment: Post-op Vital signs reviewed  Last Vitals:  Filed Vitals:   02/18/13 1345  BP: 144/62  Pulse: 77  Temp:   Resp: 13    Post vital signs: Reviewed  Level of consciousness: sedated  Complications: No apparent anesthesia complications

## 2013-02-18 NOTE — Op Note (Signed)
Kara Huynh  female MEDICAL RECORD XB:[id number] DATE OF BIRTH: 12-15-1945 PHYSICIAN: De Blanch, M.D  02/18/2013   OPERATIVE REPORT  PREOPERATIVE DIAGNOSIS: Endometrial adenocarcinoma (grade 1)  POSTOPERATIVE DIAGNOSIS: Same  PROCEDURE: Total abdominal hysterectomy, bilateral salpingo-oophorectomy  SURGEON: De Blanch, M.D ASSISTANT: Antionette Char M.D. ANESTHESIA: Gen. with oral tracheal tube ESTIMATED BLOOD LOSS: 100 mL  SURGICAL FINDINGS: At exploratory laparotomy the uterus was small. There were multiple adhesions of the left adnexa to the sigmoid colon and the left pelvic sidewall. The cul-de-sac was partially obliterated by adhesions. The upper abdomen had some omental adhesions but no other abnormalities. There was no enlarged lymph nodes. On frozen section the depth of invasion was determined to be only 2 mm. (Stage IA grade 1) therefore, pelvic and para-aortic lymphadenectomy was not performed.  PROCEDURE: The patient was brought to the operating room and after satisfactory attainment of general anesthesia was placed in a modified lithotomy position in Freeman stirrups. The anterior abdominal wall, perineum and vagina were prepped, Foley catheter was inserted, the patient was draped. A time-out was taken, SCDs were in place, prophylactic antibiotics were administered.  The abdomen was entered through a vertical incision. The prior scar was excised. Peritoneal washings were taken and sent to cytopathology.   Bookwalter retractor was assembled and  the small bowel and sigmoid colon were elevated out of the pelvis thus exposing the uterus. Lysis of adhesions was performed in order to free the adnexa from the left side of the pelvis and sigmoid colon. The uterus was grasped with two long Kelly clamps.  The right round ligament was divided the retroperitoneal spaces opened. The iliac vessels and ureter were identified. The ovarian vessels were skeletonized  clamped, cut, free tied and suture ligated. Similar procedures performed left side the pelvis. The bladder flap was advanced with sharp and blunt dissection. Uterine vessels were skeletonized then clamped cut and suture ligated. In the paracervical and cardinal ligaments were clamped, cut and suture ligated. Vaginal angles were encountered, crossclamped and the vagina transected from its connection to the cervix. The uterus, cervix,tubes and ovaries were handed off the operative field. The vaginal angles were transfixed with 0 Vicryl the central portion of vagina closed with interrupted figure-of-eight sutures of 0 Vicryl. The pelvis was irrigated and hemostasis was ascertained. Frozen section returned showing a superficially invasive grade 1 endometrial cancer. Therefore lymphadenectomy was not performed.  The retractor was taken down   The abdomen and pelvis were irrigated.  The anterior abdominal wall was closed in layers the first being a running mass closure using #1 PDS. Subcutaneous tissue was irrigated and hemostasis achieved with cautery. Experal (266 mg diluted in 40 ml saline)was injected into the subcutaneous layer. Skin was closed with skin staples. A dressing was applied.  Patient was awakened from anesthesia and taken to the recovery room in satisfactory condition. Sponge needle and instrument counts correct times two.   De Blanch, M.DIrene Salley Huynh  female MEDICAL RECORD [id number] DATE OF BIRTH: 1945-10-24 PHYSICIAN: De Blanch, M.D  02/18/2013   OPERATIVE REPORT  PREOPERATIVE DIAGNOSIS:   POSTOPERATIVE DIAGNOSIS:  PROCEDURE: Total abdominal hysterectomy, bilateral salpingo-oophorectomy  SURGEON: De Blanch, M.D ASSISTANT: Antionette Char M.D. ANESTHESIA: Gen. with oral tracheal tube ESTIMATED BLOOD LOSS:  SURGICAL FINDINGS:  PROCEDURE: The patient was brought to the operating room and after satisfactory attainment of general  anesthesia was placed in a modified lithotomy position in [place] stirrups. The anterior abdominal wall, perineum and vagina were prepped, Foley catheter  was inserted, the patient was draped. A time-out was taken, SCDs were in place, prophylactic antibiotics were administered.  The abdomen was entered through a vertical incision. Peritoneal washings were taken and sent to cytopathology.   Bookwalter retractor was assembled and  the small bowel and sigmoid colon were elevated out of the pelvis thus exposing the uterus. The uterus was grasped with two long Kelly clamps.  The right round ligament was divided the retroperitoneal spaces opened. The iliac vessels and ureter were identified. The ovarian vessels were skeletonized clamped, cut, free tied and suture ligated. Similar procedures performed left side the pelvis. The bladder flap was advanced with sharp and blunt dissection. Uterine vessels were skeletonized then clamped cut and suture ligated. In the paracervical and cardinal ligaments were clamped, cut and suture ligated. Vaginal angles were encountered, crossclamped and the vagina transected from its connection to the cervix. The uterus, cervix,tubes and ovaries were handed off the operative field. The vaginal angles were transfixed with 0 Vicryl the central portion of vagina closed with interrupted figure-of-eight sutures of 0 Vicryl. The pelvis was irrigated and hemostasis was ascertained.  The retractor was taken down   The abdomen and pelvis were irrigated.  The anterior abdominal wall was closed in layers the first being a running mass closure using #1 PDS. Subcutaneous tissue was irrigated and hemostasis achieved with cautery. Experal (266 mg diluted in 40 ml saline)was injected into the subcutaneous layer. Skin was closed with skin staples. A dressing was applied.  Patient was awakened from anesthesia and taken to the recovery room in satisfactory condition. Sponge needle and instrument counts  correct times two.   De Blanch, M.D

## 2013-02-18 NOTE — Transfer of Care (Signed)
Immediate Anesthesia Transfer of Care Note  Patient: Kara Huynh  Procedure(s) Performed: Procedure(s) (LRB): EXPLORATORY LAPAROTOMY TOTAL ABDMONIAL HYSTERECTOMY BILATERAL SALPINGO OOPHORECTOMY  (Bilateral)  Patient Location: PACU  Anesthesia Type: General  Level of Consciousness: sedated, patient cooperative and responds to stimulation  Airway & Oxygen Therapy: Patient Spontanous Breathing and Patient connected to face mask oxgen  Post-op Assessment: Report given to PACU RN and Post -op Vital signs reviewed and stable  Post vital signs: Reviewed and stable  Complications: No apparent anesthesia complications

## 2013-02-18 NOTE — Anesthesia Preprocedure Evaluation (Signed)
Anesthesia Evaluation  Patient identified by MRN, date of birth, ID band Patient awake    Reviewed: Allergy & Precautions, H&P , NPO status , Patient's Chart, lab work & pertinent test results  Airway Mallampati: II TM Distance: >3 FB Neck ROM: Full    Dental  (+) Teeth Intact and Dental Advisory Given   Pulmonary neg pulmonary ROS, former smoker,  breath sounds clear to auscultation        Cardiovascular hypertension, Pt. on medications Rhythm:Regular Rate:Normal     Neuro/Psych Anxiety Depression negative neurological ROS  negative psych ROS   GI/Hepatic negative GI ROS, Neg liver ROS,   Endo/Other  diabetes, Type 2, Oral Hypoglycemic AgentsHypothyroidism   Renal/GU negative Renal ROS  negative genitourinary   Musculoskeletal negative musculoskeletal ROS (+)   Abdominal   Peds  Hematology negative hematology ROS (+)   Anesthesia Other Findings   Reproductive/Obstetrics                           Anesthesia Physical Anesthesia Plan  ASA: II  Anesthesia Plan: General   Post-op Pain Management:    Induction: Intravenous  Airway Management Planned: Oral ETT  Additional Equipment:   Intra-op Plan:   Post-operative Plan: Extubation in OR  Informed Consent: I have reviewed the patients History and Physical, chart, labs and discussed the procedure including the risks, benefits and alternatives for the proposed anesthesia with the patient or authorized representative who has indicated his/her understanding and acceptance.   Dental advisory given  Plan Discussed with: CRNA  Anesthesia Plan Comments:         Anesthesia Quick Evaluation

## 2013-02-19 ENCOUNTER — Encounter (HOSPITAL_COMMUNITY): Payer: Self-pay | Admitting: Gynecology

## 2013-02-19 LAB — BASIC METABOLIC PANEL
BUN: 20 mg/dL (ref 6–23)
CO2: 26 mEq/L (ref 19–32)
Calcium: 7.8 mg/dL — ABNORMAL LOW (ref 8.4–10.5)
Calcium: 7.8 mg/dL — ABNORMAL LOW (ref 8.4–10.5)
Chloride: 100 mEq/L (ref 96–112)
Creatinine, Ser: 1.6 mg/dL — ABNORMAL HIGH (ref 0.50–1.10)
Creatinine, Ser: 1.62 mg/dL — ABNORMAL HIGH (ref 0.50–1.10)
GFR calc Af Amer: 37 mL/min — ABNORMAL LOW (ref >90)
GFR calc non Af Amer: 32 mL/min — ABNORMAL LOW (ref >90)
GFR calc non Af Amer: 31 mL/min — ABNORMAL LOW (ref >90)
Glucose, Bld: 133 mg/dL — ABNORMAL HIGH (ref 70–99)
Potassium: 4.1 mEq/L (ref 3.5–5.1)
Sodium: 135 mEq/L (ref 135–145)

## 2013-02-19 LAB — CBC
Hemoglobin: 10.5 g/dL — ABNORMAL LOW (ref 12.0–15.0)
MCH: 29.5 pg (ref 26.0–34.0)
MCV: 92.7 fL (ref 78.0–100.0)
Platelets: 171 10*3/uL (ref 150–400)
RBC: 3.56 MIL/uL — ABNORMAL LOW (ref 3.87–5.11)
RDW: 14.9 % (ref 11.5–15.5)
WBC: 7.4 10*3/uL (ref 4.0–10.5)

## 2013-02-19 LAB — GLUCOSE, CAPILLARY
Glucose-Capillary: 122 mg/dL — ABNORMAL HIGH (ref 70–99)
Glucose-Capillary: 125 mg/dL — ABNORMAL HIGH (ref 70–99)
Glucose-Capillary: 142 mg/dL — ABNORMAL HIGH (ref 70–99)
Glucose-Capillary: 126 mg/dL — ABNORMAL HIGH (ref 70–99)

## 2013-02-19 LAB — URINALYSIS, ROUTINE W REFLEX MICROSCOPIC
Bilirubin Urine: NEGATIVE
Glucose, UA: NEGATIVE mg/dL
Ketones, ur: NEGATIVE mg/dL
Protein, ur: NEGATIVE mg/dL
pH: 5.5 (ref 5.0–8.0)

## 2013-02-19 LAB — URINE MICROSCOPIC-ADD ON

## 2013-02-19 MED ORDER — LISINOPRIL 10 MG PO TABS
10.0000 mg | ORAL_TABLET | Freq: Every day | ORAL | Status: DC
  Administered 2013-02-21: 10 mg via ORAL
  Filled 2013-02-19 (×2): qty 1

## 2013-02-19 MED ORDER — METFORMIN HCL 500 MG PO TABS: 500.0000 mg | ORAL_TABLET | Freq: Two times a day (BID) | ORAL | Status: DC

## 2013-02-19 MED ORDER — INSULIN ASPART 100 UNIT/ML ~~LOC~~ SOLN
0.0000 [IU] | Freq: Three times a day (TID) | SUBCUTANEOUS | Status: DC
  Administered 2013-02-19 (×2): 2 [IU] via SUBCUTANEOUS

## 2013-02-19 NOTE — Plan of Care (Signed)
Problem: Phase I Progression Outcomes Goal: Sutures/staples intact Outcome: Progressing Staples intact.  Goal: Voiding-avoid urinary catheter unless indicated Outcome: Progressing Pt voiding independently.  Goal: Vital signs/hemodynamically stable Outcome: Progressing BP more stable today. SBP has remained above 100.

## 2013-02-19 NOTE — Progress Notes (Signed)
1 Day Post-Op Procedure(s) (LRB): EXPLORATORY LAPAROTOMY TOTAL ABDMONIAL HYSTERECTOMY BILATERAL SALPINGO OOPHORECTOMY  (Bilateral)  Subjective: Patient reports mild nausea.  Minimal pain reported.  Complaining of dry mouth.  No emesis reported.  Denies chest pain, dyspnea, passing flatus, or having a bowel movement.  No other concerns voiced.     Objective: Vital signs in last 24 hours: Temp:  [97.5 F (36.4 C)-98.6 F (37 C)] 97.8 F (36.6 C) (12/03 0630) Pulse Rate:  [63-97] 65 (12/03 0630) Resp:  [10-16] 16 (12/03 0029) BP: (80-167)/(48-86) 104/62 mmHg (12/03 0630) SpO2:  [94 %-100 %] 99 % (12/03 0630) Weight:  [208 lb (94.348 kg)] 208 lb (94.348 kg) (12/03 0039) Last BM Date: 02/18/13  Intake/Output from previous day: 12/02 0701 - 12/03 0700 In: 4992.5 [P.O.:1480; I.V.:2512.5; IV Piggyback:1000] Out: 780 [Urine:730; Blood:50]  Physical Examination: General: alert, cooperative and no distress Resp: clear to auscultation bilaterally Cardio: regular rate and rhythm, S1, S2 normal, no murmur, click, rub or gallop GI: soft, non-tender; bowel sounds normal; no masses,  no organomegaly and incision: midline incision dressing removed, minimal amount of sanguinous drainage noted on the dressing, no active bleeding or drainage noted Extremities: extremities normal, atraumatic, no cyanosis or edema  Labs: WBC/Hgb/Hct/Plts:  7.4/10.5/33.0/171 (12/03 0525) BUN/Cr/glu/ALT/AST/amyl/lip:  19/1.62/--/--/--/--/-- (12/03 0525)  Assessment: 67 y.o. s/p Procedure(s): EXPLORATORY LAPAROTOMY TOTAL ABDMONIAL HYSTERECTOMY BILATERAL SALPINGO OOPHORECTOMY : stable Pain:  Pain is well-controlled on PRN medications.  Heme:  Hgb 10.5 and Hct 33.0.  Stable post-operatively.  CV:  Hx HTN.  BP low at times.  Current treatment:  Lisinopril ordered to begin tomorrow if BP greater than 130/80.  GI:  Tolerating po: Yes     FEN:  BUN 19 and Cr 1.62.  Continue to monitor.  Urine output improving.  Endo:  Diabetes mellitus Type II, under good control..  CBG:  CBG (last 3)   Recent Labs  02/18/13 2344 02/19/13 0341 02/19/13 0807  GLUCAP 114* 122* 125*    Prophylaxis: pharmacologic prophylaxis (with any of the following: enoxaparin (Lovenox) 40mg  SQ 2 hours prior to surgery then every day) and intermittent pneumatic compression boots.  Plan: Advance diet as tolerated Begin lisinopril tomorrow for BP greater than 130/80 CBG to AC/HS Plan to saline lock IV this afternoon if BP increased and urine output continues to improve Bmet in the am Encourage ambulation, IS use, deep breathing, and coughing Continue post-op plan of care per Dr. Tamela Oddi   LOS: 1 day    CROSS, MELISSA DEAL 02/19/2013, 10:28 AM

## 2013-02-19 NOTE — Progress Notes (Signed)
PHARMACIST - PHYSICIAN COMMUNICATION  CONCERNING:  METFORMIN SAFE ADMINISTRATION POLICY  RECOMMENDATION: Metformin has been placed on DISCONTINUE (rejected order) STATUS and should be reordered only after any of the conditions below are ruled out.  Current safety recommendations include avoiding metformin for a minimum of 48 hours after the patient's exposure to intravenous contrast media.  DESCRIPTION:  The Pharmacy Committee has adopted a policy that restricts the use of metformin in hospitalized patients until all the contraindications to administration have been ruled out. Specific contraindications are: []  Serum creatinine ? 1.5 for males [x]  Serum creatinine ? 1.4 for females []  Shock, acute MI, sepsis, hypoxemia, dehydration []  Planned administration of intravenous iodinated contrast media []  Heart Failure patients with low EF []  Acute or chronic metabolic acidosis (including DKA)    Charolotte Eke, PharmD, pager [phone]. 02/19/2013,10:32 AM.

## 2013-02-19 NOTE — Care Management Note (Signed)
    Page 1 of 1   02/19/2013     11:29:36 AM   CARE MANAGEMENT NOTE 02/19/2013  Patient:  Kara Huynh, Kara Huynh   Account Number:  [id number]  Date Initiated:  02/19/2013  Documentation initiated by:  Lorenda Ishihara  Subjective/Objective Assessment:   67 yo female admitted s/p TAH, BSO. PTA lived at home with spouse.     Action/Plan:   Home when stable   Anticipated DC Date:  02/20/2013   Anticipated DC Plan:  HOME/SELF CARE      DC Planning Services  CM consult      Choice offered to / List presented to:             Status of service:  Completed, signed off Medicare Important Message given?   (If response is "NO", the following Medicare IM given date fields will be blank) Date Medicare IM given:   Date Additional Medicare IM given:    Discharge Disposition:  HOME/SELF CARE  Per UR Regulation:  Reviewed for med. necessity/level of care/duration of stay  If discussed at Long Length of Stay Meetings, dates discussed:    Comments:

## 2013-02-19 NOTE — Progress Notes (Signed)
Dr Tamela Oddi notified of Bmet , H&H results along with patient blood pressure of 90/60.  Patient with 150 cc of urine from foley.  No new orders at this times

## 2013-02-20 LAB — GLUCOSE, CAPILLARY
Glucose-Capillary: 96 mg/dL (ref 70–99)
Glucose-Capillary: 92 mg/dL (ref 70–99)

## 2013-02-20 LAB — BASIC METABOLIC PANEL
BUN: 21 mg/dL (ref 6–23)
Calcium: 8.3 mg/dL — ABNORMAL LOW (ref 8.4–10.5)
Creatinine, Ser: 1.52 mg/dL — ABNORMAL HIGH (ref 0.50–1.10)
GFR calc Af Amer: 39 mL/min — ABNORMAL LOW (ref >90)
GFR calc non Af Amer: 34 mL/min — ABNORMAL LOW (ref >90)
Potassium: 4.2 mEq/L (ref 3.5–5.1)
Sodium: 134 mEq/L — ABNORMAL LOW (ref 135–145)

## 2013-02-20 MED ORDER — IBUPROFEN 600 MG PO TABS
600.0000 mg | ORAL_TABLET | Freq: Four times a day (QID) | ORAL | Status: DC
  Administered 2013-02-20 – 2013-02-21 (×4): 600 mg via ORAL
  Filled 2013-02-20 (×7): qty 1

## 2013-02-20 MED ORDER — BISACODYL 10 MG RE SUPP
10.0000 mg | Freq: Once | RECTAL | Status: AC
  Administered 2013-02-20: 10 mg via RECTAL
  Filled 2013-02-20: qty 1

## 2013-02-20 NOTE — Progress Notes (Signed)
2 Days Post-Op Procedure(s) (LRB): EXPLORATORY LAPAROTOMY TOTAL ABDMONIAL HYSTERECTOMY BILATERAL SALPINGO OOPHORECTOMY  (Bilateral)  Subjective: Patient reports improvement in nausea.  Minimal pain reported.  Voiding without difficulty.  No emesis reported.  Denies chest pain, dyspnea, passing flatus, or having a bowel movement.  "I have been moving and bending a lot."  No other concerns voiced.     Objective: Vital signs in last 24 hours: Temp:  [97.9 F (36.6 C)-98.7 F (37.1 C)] 98 F (36.7 C) (12/04 0600) Pulse Rate:  [66-84] 66 (12/04 0600) BP: (91-104)/(58-65) 100/58 mmHg (12/04 0915) SpO2:  [90 %-99 %] 99 % (12/04 0600) Last BM Date: 02/18/13  Intake/Output from previous day: 12/03 0701 - 12/04 0700 In: 3330 [P.O.:1680; I.V.:1650] Out: 2650 [Urine:2650]  Physical Examination: General: alert, cooperative and no distress Resp: clear to auscultation bilaterally Cardio: regular rate and rhythm, S1, S2 normal, no murmur, click, rub or gallop GI: soft, non-tender; bowel sounds normal; no masses,  no organomegaly and incision: midline incision with minimal amount of sanguinous drainage, no active bleeding or drainage noted after cleansing the incision Extremities: extremities normal, atraumatic, no cyanosis or edema  Labs:   BUN/Cr/glu/ALT/AST/amyl/lip:  21/1.52/--/--/--/--/-- (12/04 0510)  Assessment: 67 y.o. s/p Procedure(s): EXPLORATORY LAPAROTOMY TOTAL ABDMONIAL HYSTERECTOMY BILATERAL SALPINGO OOPHORECTOMY : stable Pain:  Pain is well-controlled on PRN medications.  Heme:  Hgb 10.5 and Hct 33.0 yesterday am.  Stable post-operatively.  CV:  Hx HTN.  Hypotensive episodes improving.  Current treatment:  Lisinopril ordered if BP greater than 130/80.  GI:  Tolerating po: Yes.  GU:  Creat improving to 1.52 from 1.62 yesterday.     FEN:  Stable post-operatively.  Endo: Diabetes mellitus Type II, under good control..  CBG:  CBG (last 3)   Recent Labs  02/19/13 1637  02/19/13 2213 02/20/13 0728  GLUCAP 126* 113* 96    Prophylaxis: pharmacologic prophylaxis (with any of the following: enoxaparin (Lovenox) 40mg  SQ 2 hours prior to surgery then every day) and intermittent pneumatic compression boots.  Plan: Dulcolax suppository Encouraged to limit bending and excessive movement Encourage ambulation, IS use, deep breathing, and coughing Continue post-op plan of care per Dr. Tamela Oddi   LOS: 2 days    Kara Huynh DEAL 02/20/2013, 11:43 AM

## 2013-02-21 LAB — BASIC METABOLIC PANEL
BUN: 18 mg/dL (ref 6–23)
GFR calc Af Amer: 47 mL/min — ABNORMAL LOW (ref >90)
GFR calc non Af Amer: 41 mL/min — ABNORMAL LOW (ref >90)
Potassium: 4.3 mEq/L (ref 3.5–5.1)
Sodium: 135 mEq/L (ref 135–145)

## 2013-02-21 LAB — GLUCOSE, CAPILLARY

## 2013-02-21 MED ORDER — ENOXAPARIN SODIUM 40 MG/0.4ML ~~LOC~~ SOLN: 40.0000 mg | SUBCUTANEOUS | Status: DC

## 2013-02-21 MED ORDER — ENOXAPARIN (LOVENOX) PATIENT EDUCATION KIT
PACK | Freq: Once | Status: AC
  Administered 2013-02-21: 13:00:00
  Filled 2013-02-21: qty 1

## 2013-02-21 MED ORDER — OXYCODONE HCL 10 MG PO TABS: 5.0000 mg | ORAL_TABLET | ORAL | Status: DC | PRN

## 2013-02-21 NOTE — Discharge Summary (Signed)
Physician Discharge Summary  Patient ID: Kara Huynh MRN: [id number] DOB/AGE: [birth year] 67 y.o.  Admit date: 02/18/2013 Discharge date: 02/21/2013  Admission Diagnoses: Endometrial cancer  Discharge Diagnoses:  Principal Problem:   Endometrial cancer  Discharged Condition:  The patient is in good condition and stable for discharge.    Hospital Course: On 02/18/2013, the patient underwent the following: Procedure(s):  EXPLORATORY LAPAROTOMY TOTAL ABDMONIAL HYSTERECTOMY BILATERAL SALPINGO OOPHORECTOMY.  The postoperative course was uneventful.  She was discharged to home on postoperative day 3 tolerating a regular diet and having bowel movements.  She is to hold on restarting her lisinopril at discharge and is to follow up next week.  Reportable signs and symptoms were reviewed.  She is to perform daily lovenox injections at home for 25 days total.  Consults: None  Significant Diagnostic Studies: None  Treatments: surgery: see above  Discharge Exam: Blood pressure 97/60, pulse 76, temperature 98.3 F (36.8 C), temperature source Oral, resp. rate 18, height 5\' 4"  (1.626 m), weight 208 lb (94.348 kg), SpO2 93.00%. General appearance: alert, cooperative and no distress Resp: clear to auscultation bilaterally Cardio: regular rate and rhythm, S1, S2 normal, no murmur, click, rub or gallop GI: soft, non-tender; bowel sounds normal; no masses,  no organomegaly Extremities: extremities normal, atraumatic, no cyanosis or edema Incision/Wound: Midline incision with staples clean, dry, and intact.  No drainage or erythema noted.  Disposition: 01-Home or Self Care      Discharge Orders   Future Appointments Provider Department Dept Phone   02/25/2013 11:15 AM Doylene Bode, NP [facility] [phone]   03/28/2013 1:30 PM Jeannette Corpus, MD [facility] [phone]   Future Orders Complete By Expires   Call MD for:  difficulty breathing, headache or visual disturbances  As directed    Call MD for:  extreme fatigue  As directed    Call MD for:  hives  As directed    Call MD for:  persistant dizziness or light-headedness  As directed    Call MD for:  persistant nausea and vomiting  As directed    Call MD for:  redness, tenderness, or signs of infection (pain, swelling, redness, odor or green/yellow discharge around incision site)  As directed    Call MD for:  severe uncontrolled pain  As directed    Call MD for:  temperature >100.4  As directed    Diet - low sodium heart healthy  As directed    Driving Restrictions  As directed    Comments:     No driving for 2 weeks.  Do not take narcotics and drive.   Increase activity slowly  As directed    Lifting restrictions  As directed    Comments:     No lifting greater than 10 lbs.   Sexual Activity Restrictions  As directed    Comments:     No sexual activity, nothing in the vagina, for 6 weeks.       Medication List    STOP taking these medications       HYDROcodone-ibuprofen 7.5-200 MG per tablet  Commonly known as:  VICOPROFEN     lisinopril 20 MG tablet  Commonly known as:  PRINIVIL,ZESTRIL      TAKE these medications       atorvastatin 20 MG tablet  Commonly known as:  LIPITOR  Take 20 mg by mouth at bedtime.     Cyanocobalamin 6000 MCG Subl  Place 1 tablet under the tongue daily.     enoxaparin 40 MG/0.4ML injection  Commonly known as:  LOVENOX  Inject 0.4 mLs (40 mg total) into the skin daily.     levothyroxine 25 MCG tablet  Commonly known as:  SYNTHROID  Take 1 tablet (25 mcg total) by mouth daily before breakfast.     meloxicam 15 MG tablet  Commonly known as:  MOBIC  Take 15 mg by mouth daily.     metFORMIN 500 MG tablet  Commonly known as:  GLUCOPHAGE  Take 1 tablet (500 mg total) by mouth 2 (two) times daily with a meal.     Oxycodone HCl 10 MG Tabs  Take 0.5-1 tablets (5-10 mg total) by mouth every 4  (four) hours as needed for severe pain.     sertraline 100 MG tablet  Commonly known as:  ZOLOFT  Take 150 mg by mouth at bedtime.     Vitamin D 2000 UNITS Caps  Take 1 capsule by mouth daily.       Follow-up Information   Follow up with CROSS, MELISSA DEAL, NP On 02/25/2013. (at 11:15am at the [facility] for staple removal.)    Specialty:  Gynecologic Oncology   Contact information:   [address] [place] [address] [phone]       Follow up with Jeannette Corpus, MD On 03/28/2013. (at 1:30pm at the [facility])    Specialty:  Gynecology   Contact information:   [address] [place] [address] [phone]       Greater than thirty minutes were spend for face to face discharge instructions and discharge orders/summary in EPIC.   Signed: CROSS, MELISSA DEAL 02/21/2013, 11:19 AM

## 2013-02-25 ENCOUNTER — Ambulatory Visit: Payer: Medicare HMO | Attending: Gynecology | Admitting: Gynecologic Oncology

## 2013-02-25 VITALS — BP 142/80 | HR 88 | Temp 99.4°F | Resp 18 | Ht 64.0 in | Wt 208.6 lb

## 2013-02-25 DIAGNOSIS — C541 Malignant neoplasm of endometrium: Secondary | ICD-10-CM

## 2013-02-25 NOTE — Patient Instructions (Signed)
Doing great post-operatively.  Use tylenol for pain as needed.  Please call for any concerns.

## 2013-02-26 NOTE — Progress Notes (Addendum)
Follow Up Note: [name] 67 y.o. female  CC:  Chief Complaint  Patient presents with  . Suture / Staple Removal    Post-op follow up    HPI:  Kara Huynh is a 67 y.o. year old who noted the onset of vaginal bleeding in August of 2014. She reports hormonal therapy for approximately one year after menopause. A pelvic sonohysterogram on 01/29/2013 demonstrated a uterine length of 6.17 cm. Endometrial thickness was 0.24 cm. The cavity was filled with fluid and there was a 17 mm density seen within it. No right adnexal masses were appreciated. Left ovary was noted have a 14 x 11 mm simple cyst without blood flow. There was no free fluid. 01/31/2013 she underwent hysteroscopy D&C. 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  On 02/18/13, she underwent a total abdominal hysterectomy and bilateral salpingo-oophorectomy by Dr. De Blanch.  Hre post-operative course was uneventful.  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  No myometrial invasion.  No cervical involvement.  Benign left ovary with benign serous cystadenoma, benign left fallopian tube, benign right ovary with focal endosalpingiosis with no evidence of malignancy.    Interval History:  She presents today for post-operative follow up and for staple removal.  She states that she has done well since discharge.  She reports confusion on the first night after discharge, stating "I kept telling my husband the Dorcas Carrow was in the house."  She states that she related the confusion to the oxycodone prescribed for post-op pain, and she has not taken any since with no more issues.  She describes expected post operative status.  Adequate PO intake reported.  Bowels and bladder functioning without difficulty.  Pain minimal.  No vaginal bleeding reported.  She has not taken her  lisinopril since discharge due to hypotensive episodes during her admission.  No concerns voiced.    Review of Systems  Constitutional: Feels well.  No fever, chills, early satiety, change in appetite, unintentional change in weight.    Cardiovascular: No chest pain, shortness of breath, or edema.  Pulmonary: No cough or wheeze.  Gastrointestinal: No nausea, vomiting, or diarrhea. No bright red blood per rectum or change in bowel movement.  Genitourinary: No frequency, urgency, or dysuria. No vaginal bleeding or discharge.  Musculoskeletal: No myalgia or joint pain. Neurologic: No weakness, numbness, or change in gait.  Psychology: No depression, anxiety, or insomnia.  Current Meds:  Outpatient Encounter Prescriptions as of 02/25/2013  Medication Sig  . atorvastatin (LIPITOR) 20 MG tablet Take 20 mg by mouth at bedtime.  . Cholecalciferol (VITAMIN D) 2000 UNITS CAPS Take 1 capsule by mouth daily.   . Cyanocobalamin 6000 MCG SUBL Place 1 tablet under the tongue daily.   Marland Kitchen enoxaparin (LOVENOX) 40 MG/0.4ML injection Inject 0.4 mLs (40 mg total) into the skin daily.  Marland Kitchen levothyroxine (SYNTHROID) 25 MCG tablet Take 1 tablet (25 mcg total) by mouth daily before breakfast.  . meloxicam (MOBIC) 15 MG tablet Take 15 mg by mouth daily.  . metFORMIN (GLUCOPHAGE) 500 MG tablet Take 1 tablet (500 mg total) by mouth 2 (two) times daily with a meal.  . sertraline (ZOLOFT) 100 MG tablet Take 150 mg by mouth at bedtime.  . [DISCONTINUED] oxyCODONE 10 MG TABS Take 0.5-1 tablets (5-10 mg total) by mouth every 4 (four) hours as needed for severe pain.    Allergy: No  Known Allergies  Social Hx:   History   Social History  . Marital Status: Married    Spouse Name: N/A    Number of Children: N/A  . Years of Education: N/A   Occupational History  . Not on file.   Social History Main Topics  . Smoking status: Former Smoker    Quit date: 07/04/2009  . Smokeless tobacco: Not on file  . Alcohol Use:  Yes     Comment: twice monthly  . Drug Use: No  . Sexual Activity: Yes    Partners: Male    Birth Control/ Protection: Post-menopausal   Other Topics Concern  . Not on file   Social History Narrative  . No narrative on file    Past Surgical Hx:  Past Surgical History  Procedure Laterality Date  . Total knee arthroplasty Bilateral   . Breast surgery      biopsy  . Laparoscopy for ectopic pregnancy  1977    SALPINGOSTOMY (SIDE UNKNOWN)  . Appendectomy    . Dilitation & currettage/hystroscopy with versapoint resection N/A 01/31/2013    Procedure: DILATATION & CURETTAGE/HYSTEROSCOPY WITH CERVICAL BLOCK;  Surgeon: Zelphia Cairo, MD;  Location: [facility];  Service: Gynecology;  Laterality: N/A;  . Dilation and curettage of uterus  02-17-13    [facility] -a week ago  . Cataract extraction, bilateral Bilateral 02-17-13    bilateral  . Laparotomy Bilateral 02/18/2013    Procedure: EXPLORATORY LAPAROTOMY TOTAL ABDMONIAL HYSTERECTOMY BILATERAL SALPINGO OOPHORECTOMY ;  Surgeon: Jeannette Corpus, MD;  Location: [facility];  Service: Gynecology;  Laterality: Bilateral;    Past Medical Hx:  Past Medical History  Diagnosis Date  . Hyperlipidemia   . Hypertension   . Anxiety   . Depression   . Hypothyroidism   . Type 2 diabetes mellitus   . Arthritis   . Endometrial polyp 02-17-13    11'14 Dx. endometrial cancer    Family Hx:  Family History  Problem Relation Age of Onset  . Arthritis Mother   . Mental illness Mother   . Diabetes Mother   . Cancer Father     colon, lung, and prostate  . Arthritis Father   . Hyperlipidemia Father   . Stroke Father   . Heart disease Father   . Hypertension Father     Vitals:  Blood pressure 142/80, pulse 88, temperature 99.4 F (37.4 C), resp. rate 18, height 5\' 4"  (1.626 m), weight 208 lb 9.6 oz (94.62 kg).  Physical Exam:  General: Well developed, well nourished female in no acute distress. Alert and oriented x 3.  Neck: Supple without any  enlargements.  Cardiovascular: Regular rate and rhythm. S1 and S2 normal.  Lungs: Clear to auscultation bilaterally. No wheezes/crackles/rhonchi noted.  Skin: No rashes or lesions present. Back: No CVA tenderness.  Abdomen: Abdomen soft, non-tender and obese. Active bowel sounds in all quadrants. 17 staples removed from the midline incision without difficulty.  No erythema or drainage present.  1/2 inch steri strips applied. Extremities: No bilateral cyanosis, edema, or clubbing.   Assessment/Plan:  67 year old s/p total abdominal hysterectomy and bilateral salpingo-oophorectomy by Dr. De Blanch on 02/18/13.  Hre post-operative course was uneventful.  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  No myometrial invasion.  No cervical involvement.  Benign left ovary with benign serous cystadenoma, benign left fallopian tube, benign right ovary with focal endosalpingiosis with no evidence of malignancy.  She is doing well post-operatively.  Incision  care reinforced.  She is advised to resume taking her lisinopril.  Reportable signs and symptoms reviewed.  She is to see Dr. Stanford Breed for follow up on 03/28/13 or sooner if needed.  She is advised to call for any questions or concerns.       CROSS, MELISSA DEAL, NP 02/26/2013, 1:03 PM

## 2013-03-12 ENCOUNTER — Other Ambulatory Visit: Payer: Self-pay | Admitting: Family

## 2013-03-14 ENCOUNTER — Other Ambulatory Visit: Payer: Self-pay | Admitting: Family

## 2013-03-27 ENCOUNTER — Telehealth: Payer: Self-pay | Admitting: Family

## 2013-03-27 NOTE — Telephone Encounter (Signed)
Pt gad referral to dr Quincy Simmonds from dr kim. Her insurance company [employer] states there is no referral so they are not going to pay.  What should pt do about getting info to them [facility] fax [phone] Claim no,  [id number]

## 2013-03-28 ENCOUNTER — Encounter: Payer: Self-pay | Admitting: Gynecology

## 2013-03-28 ENCOUNTER — Ambulatory Visit: Payer: Medicare HMO | Attending: Gynecology | Admitting: Gynecology

## 2013-03-28 VITALS — BP 117/56 | HR 78 | Temp 98.9°F | Resp 16 | Ht 64.17 in | Wt 203.4 lb

## 2013-03-28 DIAGNOSIS — C541 Malignant neoplasm of endometrium: Secondary | ICD-10-CM

## 2013-03-28 DIAGNOSIS — E785 Hyperlipidemia, unspecified: Secondary | ICD-10-CM | POA: Insufficient documentation

## 2013-03-28 DIAGNOSIS — E119 Type 2 diabetes mellitus without complications: Secondary | ICD-10-CM | POA: Insufficient documentation

## 2013-03-28 DIAGNOSIS — Z79899 Other long term (current) drug therapy: Secondary | ICD-10-CM | POA: Insufficient documentation

## 2013-03-28 DIAGNOSIS — I10 Essential (primary) hypertension: Secondary | ICD-10-CM | POA: Insufficient documentation

## 2013-03-28 DIAGNOSIS — E039 Hypothyroidism, unspecified: Secondary | ICD-10-CM | POA: Insufficient documentation

## 2013-03-28 DIAGNOSIS — F411 Generalized anxiety disorder: Secondary | ICD-10-CM | POA: Insufficient documentation

## 2013-03-28 DIAGNOSIS — F329 Major depressive disorder, single episode, unspecified: Secondary | ICD-10-CM | POA: Insufficient documentation

## 2013-03-28 DIAGNOSIS — F3289 Other specified depressive episodes: Secondary | ICD-10-CM | POA: Insufficient documentation

## 2013-03-28 DIAGNOSIS — C549 Malignant neoplasm of corpus uteri, unspecified: Secondary | ICD-10-CM | POA: Insufficient documentation

## 2013-03-28 DIAGNOSIS — Z90710 Acquired absence of both cervix and uterus: Secondary | ICD-10-CM | POA: Insufficient documentation

## 2013-03-28 NOTE — Patient Instructions (Signed)
Please follow up in six months

## 2013-03-28 NOTE — Progress Notes (Signed)
Consult Note: Gyn-Onc   Kara Huynh 68 y.o. female  Chief Complaint  Patient presents with  . Endometrial cancer    Follow up    Assessment : Stage IA grade 1 endometrial adenocarcinoma. Excellent postoperative recovery  Plan: The patient is given the okay return to full levels of activity. We've reviewed her pathology and no additional therapy would be recommended. She returned to see [name] in 6 months.  Interval History: Patient returns today for a six-week postoperative checkup. Since hospital discharge she's done well has had no difficulties. She denies any GI or GU symptoms  HPI: Patient was found to have a grade 1 endometrial carcinoma and underwent a TAH ESL on 02/18/2013 final pathology showed grade 1 endometrial carcinoma that was not invasive. Her postoperative course was uncomplicated  Review of MLYYTKP:54 point review of systems is negative except as noted in interval history.   Vitals: Blood pressure 117/56, pulse 78, temperature 98.9 F (37.2 C), temperature source Oral, resp. rate 16, height 5' 4.17" (1.63 m), weight 203 lb 6.4 oz (92.262 kg).  Physical Exam: General : The patient is a healthy woman in no acute distress.  HEENT: normocephalic, extraoccular movements normal; neck is supple without thyromegally  Lynphnodes: Supraclavicular and inguinal nodes not enlarged  Abdomen: Soft, non-tender, no ascites, no organomegally, no masses, no hernias  Pelvic:  EGBUS: Normal female  Vagina: Normal, no lesions cuff is well healed. Urethra and Bladder: Normal, non-tender  Cervix: Surgically absent  Uterus: Surgically absent  Bi-manual examination: Non-tender; no adenxal masses or nodularity  Rectal: normal sphincter tone, no masses, no blood  Lower extremities: No edema or varicosities. Normal range of motion      No Known Allergies  Past Medical History  Diagnosis Date  . Hyperlipidemia   . Hypertension   . Anxiety   . Depression   . Hypothyroidism   .  Type 2 diabetes mellitus   . Arthritis   . Endometrial polyp 02-17-13    11'14 Dx. endometrial cancer    Past Surgical History  Procedure Laterality Date  . Total knee arthroplasty Bilateral   . Breast surgery      biopsy  . Laparoscopy for ectopic pregnancy  1977    SALPINGOSTOMY (SIDE UNKNOWN)  . Appendectomy    . Dilitation & currettage/hystroscopy with versapoint resection N/A 01/31/2013    Procedure: DILATATION & CURETTAGE/HYSTEROSCOPY WITH CERVICAL BLOCK;  Surgeon: Marylynn Pearson, MD;  Location: [place] [facility];  Service: Gynecology;  Laterality: N/A;  . Dilation and curettage of uterus  02-17-13    [place] -a week ago  . Cataract extraction, bilateral Bilateral 02-17-13    bilateral  . Laparotomy Bilateral 02/18/2013    Procedure: EXPLORATORY LAPAROTOMY TOTAL ABDMONIAL HYSTERECTOMY BILATERAL SALPINGO OOPHORECTOMY ;  Surgeon: Alvino Chapel, MD;  Location: [facility];  Service: Gynecology;  Laterality: Bilateral;    Current Outpatient Prescriptions  Medication Sig Dispense Refill  . atorvastatin (LIPITOR) 20 MG tablet Take 20 mg by mouth at bedtime.      . Cholecalciferol (VITAMIN D) 2000 UNITS CAPS Take 1 capsule by mouth daily.       . Cyanocobalamin 6000 MCG SUBL Place 1 tablet under the tongue daily.       Marland Kitchen enoxaparin (LOVENOX) 40 MG/0.4ML injection Inject 0.4 mLs (40 mg total) into the skin daily.  25 Syringe  0  . levothyroxine (SYNTHROID) 25 MCG tablet Take 1 tablet (25 mcg total) by mouth daily before breakfast.  90 tablet  1  .  lisinopril (PRINIVIL,ZESTRIL) 20 MG tablet TAKE 1 TABLET (20 MG TOTAL) BY MOUTH DAILY.  30 tablet  0  . meloxicam (MOBIC) 15 MG tablet Take 15 mg by mouth daily.      . metFORMIN (GLUCOPHAGE) 500 MG tablet Take 1 tablet (500 mg total) by mouth 2 (two) times daily with a meal.  180 tablet  1  . sertraline (ZOLOFT) 100 MG tablet TAKE 1.5 TABLETS (150 MG TOTAL) BY MOUTH DAILY.  135 tablet  1   No current facility-administered medications for this visit.     History   Social History  . Marital Status: Married    Spouse Name: N/A    Number of Children: N/A  . Years of Education: N/A   Occupational History  . Not on file.   Social History Main Topics  . Smoking status: Former Smoker    Quit date: 07/04/2009  . Smokeless tobacco: Not on file  . Alcohol Use: Yes     Comment: twice monthly  . Drug Use: No  . Sexual Activity: Yes    Partners: Male    Birth Control/ Protection: Post-menopausal   Other Topics Concern  . Not on file   Social History Narrative  . No narrative on file    Family History  Problem Relation Age of Onset  . Arthritis Mother   . Mental illness Mother   . Diabetes Mother   . Cancer Father     colon, lung, and prostate  . Arthritis Father   . Hyperlipidemia Father   . Stroke Father   . Heart disease Father   . Hypertension Father       Alvino Chapel, MD 03/28/2013, 2:08 PM

## 2013-04-08 ENCOUNTER — Other Ambulatory Visit: Payer: Self-pay | Admitting: Family

## 2013-04-09 ENCOUNTER — Ambulatory Visit (INDEPENDENT_AMBULATORY_CARE_PROVIDER_SITE_OTHER): Payer: Medicare HMO | Admitting: Family

## 2013-04-09 ENCOUNTER — Encounter: Payer: Self-pay | Admitting: Family

## 2013-04-09 VITALS — BP 88/54 | HR 86 | Wt 209.0 lb

## 2013-04-09 DIAGNOSIS — E7800 Pure hypercholesterolemia, unspecified: Secondary | ICD-10-CM

## 2013-04-09 DIAGNOSIS — Z90710 Acquired absence of both cervix and uterus: Secondary | ICD-10-CM

## 2013-04-09 DIAGNOSIS — Z90721 Acquired absence of ovaries, unilateral: Principal | ICD-10-CM

## 2013-04-09 DIAGNOSIS — E039 Hypothyroidism, unspecified: Secondary | ICD-10-CM

## 2013-04-09 DIAGNOSIS — I10 Essential (primary) hypertension: Secondary | ICD-10-CM

## 2013-04-09 NOTE — Patient Instructions (Signed)

## 2013-04-09 NOTE — Progress Notes (Signed)
   Subjective:    Patient ID: Kara Huynh, female    DOB: 18-Jul-1945, 68 y.o.   MRN: [id number]  HPI 68 year old white female, nonsmoker, status post hysterectomy and bilateral salpingo-oophorectomy is sent today for followup of type 2 diabetes, hypertension, hyperlipidemia, depression, and hypothyroidism. She presented in August with vaginal bleeding that was subsequently found to be endometrial cancer, stage I. She is now cancer free. She is doing remarkably well. Had some concerns of hypotension but lisinopril was resumed the last month and she's tolerating it well. She is due for colonoscopy screening. She is due to followup with oncology in 6 months.   Review of Systems  Constitutional: Negative.   HENT: Negative.   Respiratory: Negative.   Cardiovascular: Negative.   Gastrointestinal: Negative.   Endocrine: Negative.   Genitourinary: Negative.   Musculoskeletal: Negative.   Skin: Negative.   Allergic/Immunologic: Negative.   Neurological: Negative.   Hematological: Negative.   Psychiatric/Behavioral: Negative.        Objective:   Physical Exam  Constitutional: She is oriented to person, place, and time. She appears well-developed and well-nourished.  HENT:  Right Ear: External ear normal.  Left Ear: External ear normal.  Nose: Nose normal.  Mouth/Throat: Oropharynx is clear and moist.  Neck: Normal range of motion. Neck supple.  Cardiovascular: Normal rate, regular rhythm and normal heart sounds.   110/60-blood pressure rechecked  Pulmonary/Chest: Effort normal and breath sounds normal.  Abdominal: Soft. Bowel sounds are normal.  Musculoskeletal: Normal range of motion.  Neurological: She is alert and oriented to person, place, and time.  Skin: Skin is warm and dry.  Psychiatric: She has a normal mood and affect.          Assessment & Plan:  Assessment: 1. Status post hysterectomy with bilateral salpingo-oophorectomy 2. Type 2 diabetes 3. Hypertension 4.  Depression 5. Hypothyroidism 6. Hypocalcemia  Plan: Lab sent today to include BMP, LFTs, CBC, TSH, parathyroid hormone, lipids and notify patient of results. Continue current medications. Refer to GI for colonoscopy screening. Followup the patient and 4 months and sooner as needed.

## 2013-04-10 ENCOUNTER — Telehealth: Payer: Self-pay | Admitting: Family

## 2013-04-10 ENCOUNTER — Ambulatory Visit: Payer: Medicare HMO | Admitting: Family

## 2013-04-10 LAB — BASIC METABOLIC PANEL
BUN: 21 mg/dL (ref 6–23)
CHLORIDE: 108 meq/L (ref 96–112)
CO2: 26 meq/L (ref 19–32)
CREATININE: 1.3 mg/dL — AB (ref 0.4–1.2)
Calcium: 9.2 mg/dL (ref 8.4–10.5)
GFR: 42.72 mL/min — ABNORMAL LOW (ref >60.00)
Glucose, Bld: 91 mg/dL (ref 70–99)
POTASSIUM: 4.3 meq/L (ref 3.5–5.1)
Sodium: 142 mEq/L (ref 135–145)

## 2013-04-10 LAB — TSH: TSH: 0.74 u[IU]/mL (ref 0.35–5.50)

## 2013-04-10 LAB — HEPATIC FUNCTION PANEL
ALT: 14 U/L (ref 0–35)
AST: 19 U/L (ref 0–37)
Albumin: 4.2 g/dL (ref 3.5–5.2)
Alkaline Phosphatase: 58 U/L (ref 39–117)
Bilirubin, Direct: 0 mg/dL (ref 0.0–0.3)
TOTAL PROTEIN: 7.2 g/dL (ref 6.0–8.3)
Total Bilirubin: 0.4 mg/dL (ref 0.3–1.2)

## 2013-04-10 LAB — PTH, INTACT AND CALCIUM
CALCIUM: 9.2 mg/dL (ref 8.4–10.5)
PTH: 54.9 pg/mL (ref 14.0–72.0)

## 2013-04-10 LAB — HEMOGLOBIN A1C: Hgb A1c MFr Bld: 6.2 % (ref 4.6–6.5)

## 2013-04-10 NOTE — Telephone Encounter (Signed)
Relevant patient education assigned to patient using Emmi. ° °

## 2013-04-21 ENCOUNTER — Other Ambulatory Visit: Payer: Self-pay | Admitting: Family

## 2013-04-24 ENCOUNTER — Encounter: Payer: Self-pay | Admitting: Family

## 2013-04-25 ENCOUNTER — Other Ambulatory Visit: Payer: Self-pay | Admitting: Family

## 2013-04-25 DIAGNOSIS — Z1159 Encounter for screening for other viral diseases: Secondary | ICD-10-CM

## 2013-04-25 DIAGNOSIS — Z1211 Encounter for screening for malignant neoplasm of colon: Secondary | ICD-10-CM

## 2013-04-30 ENCOUNTER — Other Ambulatory Visit (INDEPENDENT_AMBULATORY_CARE_PROVIDER_SITE_OTHER): Payer: Medicare HMO

## 2013-04-30 DIAGNOSIS — Z1159 Encounter for screening for other viral diseases: Secondary | ICD-10-CM

## 2013-05-01 ENCOUNTER — Encounter: Payer: Self-pay | Admitting: Internal Medicine

## 2013-05-01 LAB — MEASLES/MUMPS/RUBELLA IMMUNITY
Mumps IgG: >300 AU/mL — ABNORMAL HIGH (ref <9.00)
Rubella: >33 Index — ABNORMAL HIGH (ref <0.90)
Rubeola IgG: >300 AU/mL — ABNORMAL HIGH (ref <25.00)

## 2013-05-14 ENCOUNTER — Other Ambulatory Visit: Payer: Self-pay | Admitting: Family

## 2013-06-05 ENCOUNTER — Other Ambulatory Visit: Payer: Self-pay | Admitting: Family

## 2013-06-19 ENCOUNTER — Ambulatory Visit (AMBULATORY_SURGERY_CENTER): Payer: Medicare HMO | Admitting: *Deleted

## 2013-06-19 VITALS — Ht 65.0 in | Wt 201.0 lb

## 2013-06-19 DIAGNOSIS — Z800 Family history of malignant neoplasm of digestive organs: Secondary | ICD-10-CM

## 2013-06-19 MED ORDER — MOVIPREP 100 G PO SOLR: ORAL | Status: DC

## 2013-06-19 NOTE — Progress Notes (Signed)
I added instructions to pt- do not take Metformin the day of the procedure; take when you get home that day- understanding voiced  No egg or soy allergy  No home oxygen use or problems with anesthesia

## 2013-07-03 ENCOUNTER — Other Ambulatory Visit: Payer: Self-pay

## 2013-07-03 DIAGNOSIS — Z1231 Encounter for screening mammogram for malignant neoplasm of breast: Secondary | ICD-10-CM

## 2013-07-04 ENCOUNTER — Ambulatory Visit (AMBULATORY_SURGERY_CENTER): Payer: Medicare HMO | Admitting: Internal Medicine

## 2013-07-04 ENCOUNTER — Encounter: Payer: Self-pay | Admitting: Internal Medicine

## 2013-07-04 VITALS — BP 105/64 | HR 55 | Temp 97.8°F | Resp 29 | Ht 65.0 in | Wt 201.0 lb

## 2013-07-04 DIAGNOSIS — D126 Benign neoplasm of colon, unspecified: Secondary | ICD-10-CM

## 2013-07-04 DIAGNOSIS — Z800 Family history of malignant neoplasm of digestive organs: Secondary | ICD-10-CM

## 2013-07-04 DIAGNOSIS — Z1211 Encounter for screening for malignant neoplasm of colon: Secondary | ICD-10-CM

## 2013-07-04 LAB — GLUCOSE, CAPILLARY
Glucose-Capillary: 108 mg/dL — ABNORMAL HIGH (ref 70–99)
Glucose-Capillary: 100 mg/dL — ABNORMAL HIGH (ref 70–99)

## 2013-07-04 MED ORDER — SODIUM CHLORIDE 0.9 % IV SOLN: 500.0000 mL | INTRAVENOUS | Status: DC

## 2013-07-04 NOTE — Patient Instructions (Signed)
DISCHARGE INSTRUCTIONS GIVEN WITH VERBAL UNDERSTANDING. HANDOUTS ON POLYPS AND DIVERTICULOSIS. RESUME PREVIOUS MEDICATIONS.YOU HAD AN ENDOSCOPIC PROCEDURE TODAY AT [facility]: Refer to the procedure report that was given to you for any specific questions about what was found during the examination.  If the procedure report does not answer your questions, please call your gastroenterologist to clarify.  If you requested that your care partner not be given the details of your procedure findings, then the procedure report has been included in a sealed envelope for you to review at your convenience later.  YOU SHOULD EXPECT: Some feelings of bloating in the abdomen. Passage of more gas than usual.  Walking can help get rid of the air that was put into your GI tract during the procedure and reduce the bloating. If you had a lower endoscopy (such as a colonoscopy or flexible sigmoidoscopy) you may notice spotting of blood in your stool or on the toilet paper. If you underwent a bowel prep for your procedure, then you may not have a normal bowel movement for a few days.  DIET: Your first meal following the procedure should be a light meal and then it is ok to progress to your normal diet.  A half-sandwich or bowl of soup is an example of a good first meal.  Heavy or fried foods are harder to digest and may make you feel nauseous or bloated.  Likewise meals heavy in dairy and vegetables can cause extra gas to form and this can also increase the bloating.  Drink plenty of fluids but you should avoid alcoholic beverages for 24 hours.  ACTIVITY: Your care partner should take you home directly after the procedure.  You should plan to take it easy, moving slowly for the rest of the day.  You can resume normal activity the day after the procedure however you should NOT DRIVE or use heavy machinery for 24 hours (because of the sedation medicines used during the test).    SYMPTOMS TO REPORT  IMMEDIATELY: A gastroenterologist can be reached at any hour.  During normal business hours, 8:30 AM to 5:00 PM Monday through Friday, call [phone].  After hours and on weekends, please call the GI answering service at [phone] who will take a message and have the physician on call contact you.   Following lower endoscopy (colonoscopy or flexible sigmoidoscopy):  Excessive amounts of blood in the stool  Significant tenderness or worsening of abdominal pains  Swelling of the abdomen that is new, acute  Fever of 100F or higher  FOLLOW UP: If any biopsies were taken you will be contacted by phone or by letter within the next 1-3 weeks.  Call your gastroenterologist if you have not heard about the biopsies in 3 weeks.  Our staff will call the home number listed on your records the next business day following your procedure to check on you and address any questions or concerns that you may have at that time regarding the information given to you following your procedure. This is a courtesy call and so if there is no answer at the home number and we have not heard from you through the emergency physician on call, we will assume that you have returned to your regular daily activities without incident.  SIGNATURES/CONFIDENTIALITY: You and/or your care partner have signed paperwork which will be entered into your electronic medical record.  These signatures attest to the fact that that the information above on your After Visit Summary has  been reviewed and is understood.  Full responsibility of the confidentiality of this discharge information lies with you and/or your care-partner.

## 2013-07-04 NOTE — Op Note (Signed)
[facility]  [address] [place], [address]   COLONOSCOPY PROCEDURE REPORT  PATIENT: Kara, Huynh  MR#: [id number] BIRTHDATE: 24-Dec-1945 , 73  yrs. old GENDER: Female ENDOSCOPIST: Lafayette Dragon, MD REFERRED [id number] Megan Salon, FNP-BC PROCEDURE DATE:  07/04/2013 PROCEDURE:   Colonoscopy with cold biopsy polypectomy First Screening Colonoscopy - Avg.  risk and is 50 yrs.  old or older - No.  Prior Negative Screening - Now for repeat screening. Above average risk  History of Adenoma - Now for follow-up colonoscopy & has been > or = to 3 yrs.  N/A  Polyps Removed Today? Yes. ASA CLASS:   Class II INDICATIONS:father with colon cancer, last colonoscopy in [place]. Due for recall colonoscopy. MEDICATIONS: MAC sedation, administered by CRNA and propofol (Diprivan) 300mg  IV  DESCRIPTION OF PROCEDURE:   After the risks benefits and alternatives of the procedure were thoroughly explained, informed consent was obtained.  A digital rectal exam revealed no abnormalities of the rectum.   The [id number]  endoscope was introduced through the anus and advanced to the cecum, which was identified by both the appendix and ileocecal valve. No adverse events experienced.   The quality of the prep was good, using MoviPrep  The instrument was then slowly withdrawn as the colon was fully examined.      COLON FINDINGS: A sessile polyp ranging between 3-81mm in size was found in the descending colon.  A polypectomy was performed with cold forceps.  The resection was complete and the polyp tissue was completely retrieved.   There was severe diverticulosis noted in the sigmoid colon with associated angulation, muscular hypertrophy, tortuosity, colonic narrowing and colonic spasm.  Retroflexed views revealed no abnormalities. The time to cecum=12 minutes 27 seconds. Withdrawal time=8 minutes 44 seconds.  The scope was withdrawn and the procedure  completed. COMPLICATIONS: There were no complications.  ENDOSCOPIC IMPRESSION: Sessile polyp ranging between 3-19mm in size was found in the descending colon; polypectomy was performed with cold forceps severe diverticulosis of the sigmoid colon with partial narrowing of the lumen  RECOMMENDATIONS: 1.  Await biopsy results 2.  high fiber diet Recall colonoscopy in 5 years 3. fiber supplements daily  eSigned:  Lafayette Dragon, MD 07/04/2013 11:17 AM   cc:   PATIENT NAME:  Kara, Huynh MR#: [id number]

## 2013-07-04 NOTE — Progress Notes (Signed)
Called to room to assist during endoscopic procedure.  Patient ID and intended procedure confirmed with present staff. Received instructions for my participation in the procedure from the performing physician.  

## 2013-07-06 ENCOUNTER — Other Ambulatory Visit: Payer: Self-pay | Admitting: Family

## 2013-07-07 ENCOUNTER — Telehealth: Payer: Self-pay

## 2013-07-07 NOTE — Telephone Encounter (Signed)
Unable to leave message due to restrictions on phone.

## 2013-07-09 ENCOUNTER — Encounter: Payer: Self-pay | Admitting: Internal Medicine

## 2013-07-19 ENCOUNTER — Other Ambulatory Visit: Payer: Self-pay | Admitting: Family

## 2013-08-05 ENCOUNTER — Ambulatory Visit
Admission: RE | Admit: 2013-08-05 | Discharge: 2013-08-05 | Disposition: A | Discharge status: Home / Self Care | Payer: Medicare HMO | Source: Ambulatory Visit

## 2013-08-05 DIAGNOSIS — Z1231 Encounter for screening mammogram for malignant neoplasm of breast: Secondary | ICD-10-CM

## 2013-08-29 ENCOUNTER — Ambulatory Visit: Payer: Medicare HMO | Attending: Gynecology | Admitting: Gynecology

## 2013-08-29 ENCOUNTER — Ambulatory Visit: Payer: Medicare HMO | Admitting: Gynecology

## 2013-08-29 ENCOUNTER — Encounter: Payer: Self-pay | Admitting: Gynecology

## 2013-08-29 ENCOUNTER — Other Ambulatory Visit (HOSPITAL_COMMUNITY)
Admission: RE | Admit: 2013-08-29 | Discharge: 2013-08-29 | Disposition: A | Discharge status: Home / Self Care | Payer: Medicare HMO | Source: Ambulatory Visit | Attending: Gynecology | Admitting: Gynecology

## 2013-08-29 VITALS — BP 137/68 | HR 68 | Temp 98.2°F | Resp 18 | Ht 65.0 in | Wt 200.0 lb

## 2013-08-29 DIAGNOSIS — C549 Malignant neoplasm of corpus uteri, unspecified: Secondary | ICD-10-CM | POA: Insufficient documentation

## 2013-08-29 DIAGNOSIS — Z90710 Acquired absence of both cervix and uterus: Secondary | ICD-10-CM

## 2013-08-29 DIAGNOSIS — E119 Type 2 diabetes mellitus without complications: Secondary | ICD-10-CM | POA: Insufficient documentation

## 2013-08-29 DIAGNOSIS — Z8542 Personal history of malignant neoplasm of other parts of uterus: Secondary | ICD-10-CM

## 2013-08-29 DIAGNOSIS — E785 Hyperlipidemia, unspecified: Secondary | ICD-10-CM | POA: Insufficient documentation

## 2013-08-29 DIAGNOSIS — E039 Hypothyroidism, unspecified: Secondary | ICD-10-CM | POA: Insufficient documentation

## 2013-08-29 DIAGNOSIS — I10 Essential (primary) hypertension: Secondary | ICD-10-CM | POA: Insufficient documentation

## 2013-08-29 DIAGNOSIS — Z96659 Presence of unspecified artificial knee joint: Secondary | ICD-10-CM | POA: Insufficient documentation

## 2013-08-29 DIAGNOSIS — F3289 Other specified depressive episodes: Secondary | ICD-10-CM | POA: Insufficient documentation

## 2013-08-29 DIAGNOSIS — Z79899 Other long term (current) drug therapy: Secondary | ICD-10-CM | POA: Insufficient documentation

## 2013-08-29 DIAGNOSIS — N840 Polyp of corpus uteri: Secondary | ICD-10-CM | POA: Insufficient documentation

## 2013-08-29 DIAGNOSIS — F411 Generalized anxiety disorder: Secondary | ICD-10-CM | POA: Insufficient documentation

## 2013-08-29 DIAGNOSIS — Z124 Encounter for screening for malignant neoplasm of cervix: Secondary | ICD-10-CM | POA: Insufficient documentation

## 2013-08-29 DIAGNOSIS — Z87891 Personal history of nicotine dependence: Secondary | ICD-10-CM | POA: Insufficient documentation

## 2013-08-29 DIAGNOSIS — C541 Malignant neoplasm of endometrium: Secondary | ICD-10-CM

## 2013-08-29 DIAGNOSIS — F329 Major depressive disorder, single episode, unspecified: Secondary | ICD-10-CM | POA: Insufficient documentation

## 2013-08-29 NOTE — Patient Instructions (Signed)
Return to see us in 6 months. 

## 2013-08-29 NOTE — Progress Notes (Signed)
Consult Note: Gyn-Onc   Kara Huynh 68 y.o. female  Chief Complaint  Patient presents with  . Endo ca    FOLLOW UP    Assessment : Stage IA grade 1 endometrial adenocarcinoma. NED  Plan: Pap smears obtained. She returned to see [name] in 6 months.  Interval History: Patient returns today for scheduled followup. Since her last visit she's done well. She denies any pelvic pain pressure or bleeding. She denies any GI or GU symptoms. Unfortunately, her husband is recently been diagnosed with leukemia and is undergoing chemotherapy.  HPI: Patient was found to have a grade 1 endometrial carcinoma and underwent a TAH ESL on 02/18/2013 final pathology showed grade 1 endometrial carcinoma that was not invasive. Her postoperative course was uncomplicated  Review of ZOXWRUE:45 point review of systems is negative except as noted in interval history.   Vitals: Blood pressure 137/68, pulse 68, temperature 98.2 F (36.8 C), temperature source Oral, resp. rate 18, height 5\' 5"  (1.651 m), weight 200 lb (90.719 kg).  Physical Exam: General : The patient is a healthy woman in no acute distress.  HEENT: normocephalic, extraoccular movements normal; neck is supple without thyromegally  Lynphnodes: Supraclavicular and inguinal nodes not enlarged  Abdomen: Soft, non-tender, no ascites, no organomegally, no masses, no hernias midline incision is well-healed.  Pelvic:  EGBUS: Normal female  Vagina: Normal, no lesions cuff is well healed. Urethra and Bladder: Normal, non-tender  Cervix: Surgically absent  Uterus: Surgically absent  Bi-manual examination: Non-tender; no adenxal masses or nodularity  Rectal: normal sphincter tone, no masses, no blood  Lower extremities: No edema or varicosities. Normal range of motion      No Known Allergies  Past Medical History  Diagnosis Date  . Hyperlipidemia   . Hypertension   . Anxiety   . Depression   . Hypothyroidism   . Type 2 diabetes mellitus   .  Arthritis   . Endometrial polyp 02-17-13    11'14 Dx. endometrial cancer  . Cancer     uterine    Past Surgical History  Procedure Laterality Date  . Total knee arthroplasty Bilateral   . Breast surgery      biopsy  . Laparoscopy for ectopic pregnancy  1977    SALPINGOSTOMY (SIDE UNKNOWN)  . Appendectomy    . Dilitation & currettage/hystroscopy with versapoint resection N/A 01/31/2013    Procedure: DILATATION & CURETTAGE/HYSTEROSCOPY WITH CERVICAL BLOCK;  Surgeon: Marylynn Pearson, MD;  Location: [facility];  Service: Gynecology;  Laterality: N/A;  . Cataract extraction, bilateral Bilateral 02-17-13    bilateral  . Laparotomy Bilateral 02/18/2013    Procedure: EXPLORATORY LAPAROTOMY TOTAL ABDMONIAL HYSTERECTOMY BILATERAL SALPINGO OOPHORECTOMY ;  Surgeon: Alvino Chapel, MD;  Location: [facility];  Service: Gynecology;  Laterality: Bilateral;  . Total abdominal hysterectomy w/ bilateral salpingoophorectomy      Current Outpatient Prescriptions  Medication Sig Dispense Refill  . atorvastatin (LIPITOR) 20 MG tablet Take 20 mg by mouth at bedtime.      Marland Kitchen atorvastatin (LIPITOR) 20 MG tablet TAKE 1 TABLET BY MOUTH ONCE DAILY  90 tablet  0  . Cholecalciferol (VITAMIN D) 2000 UNITS CAPS Take 1 capsule by mouth daily.       . Cyanocobalamin 6000 MCG SUBL Place 1 tablet under the tongue daily.       Marland Kitchen levothyroxine (SYNTHROID, LEVOTHROID) 25 MCG tablet TAKE 1 TABLET EVERY MORNING AS DIRECTED  30 tablet  1  . lisinopril (PRINIVIL,ZESTRIL) 20 MG tablet TAKE 1  TABLET (20 MG TOTAL) BY MOUTH DAILY. NEEDS OFFICE VISIT  90 tablet  0  . meloxicam (MOBIC) 15 MG tablet TAKE 1 TABLET BY MOUTH EVERY DAY  30 tablet  4  . metFORMIN (GLUCOPHAGE) 500 MG tablet TAKE 1 TABLET (500 MG TOTAL) BY MOUTH 2 (TWO) TIMES DAILY WITH A MEAL.  180 tablet  1  . sertraline (ZOLOFT) 100 MG tablet TAKE 1.5 TABLETS (150 MG TOTAL) BY MOUTH DAILY.  135 tablet  1   No current facility-administered medications for this visit.     History   Social History  . Marital Status: Married    Spouse Name: N/A    Number of Children: N/A  . Years of Education: N/A   Occupational History  . Not on file.   Social History Main Topics  . Smoking status: Former Smoker    Quit date: 07/04/2009  . Smokeless tobacco: Never Used  . Alcohol Use: Yes     Comment: twice monthly  . Drug Use: No  . Sexual Activity: Yes    Partners: Male    Birth Control/ Protection: Post-menopausal   Other Topics Concern  . Not on file   Social History Narrative  . No narrative on file    Family History  Problem Relation Age of Onset  . Arthritis Mother   . Mental illness Mother   . Diabetes Mother   . Cancer Father     colon, lung, and prostate  . Arthritis Father   . Hyperlipidemia Father   . Stroke Father   . Heart disease Father   . Hypertension Father   . Colitis Father   . Esophageal cancer Neg Hx   . Rectal cancer Neg Hx   . Stomach cancer Neg Hx       CLARKE-PEARSON,Kara Gassen L, MD 08/29/2013, 9:02 AM

## 2013-09-02 ENCOUNTER — Other Ambulatory Visit: Payer: Self-pay | Admitting: Family

## 2013-09-03 LAB — CYTOLOGY - PAP

## 2013-09-07 ENCOUNTER — Other Ambulatory Visit: Payer: Self-pay | Admitting: Family

## 2013-09-09 ENCOUNTER — Telehealth: Payer: Self-pay | Admitting: *Deleted

## 2013-09-09 NOTE — Telephone Encounter (Signed)
Notified pt of - pap results  

## 2013-09-24 ENCOUNTER — Other Ambulatory Visit: Payer: Self-pay | Admitting: Family

## 2013-10-01 ENCOUNTER — Telehealth: Payer: Self-pay

## 2013-10-01 DIAGNOSIS — E119 Type 2 diabetes mellitus without complications: Secondary | ICD-10-CM

## 2013-10-01 NOTE — Telephone Encounter (Signed)
DIABETIC BUNDLE.  Left message to advise pt to call office to schedule lab and f/u appointment. Advised that she should be fasting for the lab appt

## 2013-10-16 ENCOUNTER — Other Ambulatory Visit: Payer: Self-pay | Admitting: Family

## 2013-11-05 ENCOUNTER — Other Ambulatory Visit: Payer: Self-pay | Admitting: Family

## 2013-11-17 ENCOUNTER — Ambulatory Visit (INDEPENDENT_AMBULATORY_CARE_PROVIDER_SITE_OTHER): Payer: Medicare HMO | Admitting: Family

## 2013-11-17 ENCOUNTER — Encounter: Payer: Self-pay | Admitting: Family

## 2013-11-17 VITALS — BP 128/68 | HR 85 | Ht 65.0 in | Wt 208.9 lb

## 2013-11-17 DIAGNOSIS — E039 Hypothyroidism, unspecified: Secondary | ICD-10-CM

## 2013-11-17 DIAGNOSIS — E119 Type 2 diabetes mellitus without complications: Secondary | ICD-10-CM

## 2013-11-17 DIAGNOSIS — I10 Essential (primary) hypertension: Secondary | ICD-10-CM

## 2013-11-17 DIAGNOSIS — E7800 Pure hypercholesterolemia, unspecified: Secondary | ICD-10-CM

## 2013-11-17 LAB — HEMOGLOBIN A1C: Hgb A1c MFr Bld: 6.3 % (ref 4.6–6.5)

## 2013-11-17 LAB — CBC WITH DIFFERENTIAL/PLATELET
BASOS ABS: 0 10*3/uL (ref 0.0–0.1)
Basophils Relative: 0.6 % (ref 0.0–3.0)
Eosinophils Absolute: 0.3 10*3/uL (ref 0.0–0.7)
Eosinophils Relative: 5.6 % — ABNORMAL HIGH (ref 0.0–5.0)
HCT: 37.4 % (ref 36.0–46.0)
Hemoglobin: 12.4 g/dL (ref 12.0–15.0)
Lymphocytes Relative: 33.4 % (ref 12.0–46.0)
Lymphs Abs: 2 10*3/uL (ref 0.7–4.0)
MCHC: 33.1 g/dL (ref 30.0–36.0)
MCV: 90.6 fl (ref 78.0–100.0)
MONOS PCT: 4.8 % (ref 3.0–12.0)
Monocytes Absolute: 0.3 10*3/uL (ref 0.1–1.0)
NEUTROS ABS: 3.3 10*3/uL (ref 1.4–7.7)
Neutrophils Relative %: 55.6 % (ref 43.0–77.0)
Platelets: 215 10*3/uL (ref 150.0–400.0)
RBC: 4.13 Mil/uL (ref 3.87–5.11)
RDW: 15.2 % (ref 11.5–15.5)
WBC: 5.9 10*3/uL (ref 4.0–10.5)

## 2013-11-17 LAB — BASIC METABOLIC PANEL
BUN: 21 mg/dL (ref 6–23)
CHLORIDE: 106 meq/L (ref 96–112)
CO2: 27 meq/L (ref 19–32)
Calcium: 9.4 mg/dL (ref 8.4–10.5)
Creatinine, Ser: 1.3 mg/dL — ABNORMAL HIGH (ref 0.4–1.2)
GFR: 45.02 mL/min — AB (ref >60.00)
GLUCOSE: 102 mg/dL — AB (ref 70–99)
Potassium: 4.6 mEq/L (ref 3.5–5.1)
SODIUM: 143 meq/L (ref 135–145)

## 2013-11-17 LAB — TSH: TSH: 1.14 u[IU]/mL (ref 0.35–4.50)

## 2013-11-17 LAB — LIPID PANEL
Cholesterol: 157 mg/dL (ref 0–200)
HDL: 57.9 mg/dL (ref >39.00)
LDL Cholesterol: 72 mg/dL (ref 0–99)
NONHDL: 99.1
Total CHOL/HDL Ratio: 3
Triglycerides: 137 mg/dL (ref 0.0–149.0)
VLDL: 27.4 mg/dL (ref 0.0–40.0)

## 2013-11-17 LAB — HEPATIC FUNCTION PANEL
ALBUMIN: 4.2 g/dL (ref 3.5–5.2)
ALK PHOS: 62 U/L (ref 39–117)
ALT: 15 U/L (ref 0–35)
AST: 18 U/L (ref 0–37)
Bilirubin, Direct: 0 mg/dL (ref 0.0–0.3)
TOTAL PROTEIN: 7.3 g/dL (ref 6.0–8.3)
Total Bilirubin: 0.6 mg/dL (ref 0.2–1.2)

## 2013-11-17 MED ORDER — METFORMIN HCL 500 MG PO TABS: ORAL_TABLET | ORAL | Status: DC

## 2013-11-17 MED ORDER — LISINOPRIL 20 MG PO TABS: ORAL_TABLET | ORAL | Status: DC

## 2013-11-17 MED ORDER — ATORVASTATIN CALCIUM 20 MG PO TABS: ORAL_TABLET | ORAL | Status: DC

## 2013-11-17 MED ORDER — SERTRALINE HCL 100 MG PO TABS: ORAL_TABLET | ORAL | Status: DC

## 2013-11-17 NOTE — Progress Notes (Signed)
Subjective:    Patient ID: Kara Huynh, female    DOB: Apr 10, 1945, 68 y.o.   MRN: [id number]  HPI 68 year old white female, nonsmoker, with a history of Type 2 DM, HTN, Hypercholesterolemia, Depression, Hypothyroidism, Osteoarthritis.    Review of Systems  Constitutional: Negative.   HENT: Negative.   Respiratory: Negative.   Cardiovascular: Negative.   Gastrointestinal: Negative.   Endocrine: Negative.   Genitourinary: Negative.   Musculoskeletal: Negative.   Skin: Negative.   Allergic/Immunologic: Negative.   Neurological: Negative.   Hematological: Negative.   Psychiatric/Behavioral: Negative.    Past Medical History  Diagnosis Date  . Hyperlipidemia   . Hypertension   . Anxiety   . Depression   . Hypothyroidism   . Type 2 diabetes mellitus   . Arthritis   . Endometrial polyp 02-17-13    11'14 Dx. endometrial cancer  . Cancer     uterine    History   Social History  . Marital Status: Married    Spouse Name: N/A    Number of Children: N/A  . Years of Education: N/A   Occupational History  . Not on file.   Social History Main Topics  . Smoking status: Former Smoker    Quit date: 07/04/2009  . Smokeless tobacco: Never Used  . Alcohol Use: Yes     Comment: twice monthly  . Drug Use: No  . Sexual Activity: Yes    Partners: Male    Birth Control/ Protection: Post-menopausal   Other Topics Concern  . Not on file   Social History Narrative  . No narrative on file    Past Surgical History  Procedure Laterality Date  . Total knee arthroplasty Bilateral   . Breast surgery      biopsy  . Laparoscopy for ectopic pregnancy  1977    SALPINGOSTOMY (SIDE UNKNOWN)  . Appendectomy    . Dilitation & currettage/hystroscopy with versapoint resection N/A 01/31/2013    Procedure: DILATATION & CURETTAGE/HYSTEROSCOPY WITH CERVICAL BLOCK;  Surgeon: Marylynn Pearson, MD;  Location: [facility];  Service: Gynecology;  Laterality: N/A;  . Cataract extraction, bilateral  Bilateral 02-17-13    bilateral  . Laparotomy Bilateral 02/18/2013    Procedure: EXPLORATORY LAPAROTOMY TOTAL ABDMONIAL HYSTERECTOMY BILATERAL SALPINGO OOPHORECTOMY ;  Surgeon: Alvino Chapel, MD;  Location: [facility];  Service: Gynecology;  Laterality: Bilateral;  . Total abdominal hysterectomy w/ bilateral salpingoophorectomy      Family History  Problem Relation Age of Onset  . Arthritis Mother   . Mental illness Mother   . Diabetes Mother   . Cancer Father     colon, lung, and prostate  . Arthritis Father   . Hyperlipidemia Father   . Stroke Father   . Heart disease Father   . Hypertension Father   . Colitis Father   . Esophageal cancer Neg Hx   . Rectal cancer Neg Hx   . Stomach cancer Neg Hx     No Known Allergies  Current Outpatient Prescriptions on File Prior to Visit  Medication Sig Dispense Refill  . Cholecalciferol (VITAMIN D) 2000 UNITS CAPS Take 1 capsule by mouth daily.       . Cyanocobalamin 6000 MCG SUBL Place 1 tablet under the tongue daily.       Marland Kitchen levothyroxine (SYNTHROID, LEVOTHROID) 25 MCG tablet TAKE 1 TABLET EVERY MORNING AS DIRECTED  30 tablet  1  . levothyroxine (SYNTHROID, LEVOTHROID) 25 MCG tablet TAKE 1 TABLET EVERY MORNING AS DIRECTED  30 tablet  1  . meloxicam (MOBIC) 15 MG tablet TAKE 1 TABLET BY MOUTH EVERY DAY  30 tablet  4   No current facility-administered medications on file prior to visit.    BP 128/68  Pulse 85  Ht 5\' 5"  (1.651 m)  Wt 208 lb 14.4 oz (94.756 kg)  BMI 34.76 kg/m2chart    Objective:   Physical Exam  Constitutional: She is oriented to person, place, and time. She appears well-developed and well-nourished.  HENT:  Right Ear: External ear normal.  Left Ear: External ear normal.  Nose: Nose normal.  Mouth/Throat: Oropharynx is clear and moist.  Eyes: Pupils are equal, round, and reactive to light.  Neck: Normal range of motion. Neck supple.  Cardiovascular: Normal rate, regular rhythm and normal heart sounds.     Pulmonary/Chest: Effort normal and breath sounds normal.  Abdominal: Soft. Bowel sounds are normal.  Musculoskeletal: Normal range of motion.  Neurological: She is alert and oriented to person, place, and time.  Skin: Skin is warm and dry.  Psychiatric: She has a normal mood and affect.          Assessment & Plan:  Megahn was seen today for follow-up.  Diagnoses and associated orders for this visit:  Unspecified hypothyroidism - TSH - CBC with Differential  Unspecified essential hypertension - Basic Metabolic Panel - Hepatic Function Panel - CBC with Differential  Pure hypercholesterolemia - Basic Metabolic Panel - Hepatic Function Panel - Lipid Panel - CBC with Differential  Type 2 diabetes mellitus without complication - Hemoglobin A1c - CBC with Differential  Other Orders - sertraline (ZOLOFT) 100 MG tablet; TAKE 1.5 TABLETS (150 MG TOTAL) BY MOUTH DAILY. - metFORMIN (GLUCOPHAGE) 500 MG tablet; TAKE 1 TABLET BY MOUTH TWICE A DAY WITH MEALS - atorvastatin (LIPITOR) 20 MG tablet; TAKE 1 TABLET BY MOUTH EVERY DAY - lisinopril (PRINIVIL,ZESTRIL) 20 MG tablet; TAKE 1 TABLET (20 MG TOTAL) BY MOUTH DAILY.   Call the office with any questions or concerns. Recheck as scheduled in 4 months and as needed.

## 2013-11-17 NOTE — Patient Instructions (Signed)
Exercise to Stay Healthy Exercise helps you become and stay healthy. EXERCISE IDEAS AND TIPS Choose exercises that:  You enjoy.  Fit into your day. You do not need to exercise really hard to be healthy. You can do exercises at a slow or medium level and stay healthy. You can:  Stretch before and after working out.  Try yoga, Pilates, or tai chi.  Lift weights.  Walk fast, swim, jog, run, climb stairs, bicycle, dance, or rollerskate.  Take aerobic classes. Exercises that burn about 150 calories:  Running 1  miles in 15 minutes.  Playing volleyball for 45 to 60 minutes.  Washing and waxing a car for 45 to 60 minutes.  Playing touch football for 45 minutes.  Walking 1  miles in 35 minutes.  Pushing a stroller 1  miles in 30 minutes.  Playing basketball for 30 minutes.  Raking leaves for 30 minutes.  Bicycling 5 miles in 30 minutes.  Walking 2 miles in 30 minutes.  Dancing for 30 minutes.  Shoveling snow for 15 minutes.  Swimming laps for 20 minutes.  Walking up stairs for 15 minutes.  Bicycling 4 miles in 15 minutes.  Gardening for 30 to 45 minutes.  Jumping rope for 15 minutes.  Washing windows or floors for 45 to 60 minutes. Document Released: 04/08/2010 Document Revised: 05/29/2011 Document Reviewed: 04/08/2010 Exit[employer] Patient Information 2015 [employer]. This information is not intended to replace advice given to you by your health care provider. Make sure you discuss any questions you have with your health care provider.  

## 2013-11-17 NOTE — Progress Notes (Signed)
Pre visit review using our clinic review tool, if applicable. No additional management support is needed unless otherwise documented below in the visit note. 

## 2013-12-02 ENCOUNTER — Other Ambulatory Visit: Payer: Self-pay | Admitting: Family

## 2013-12-08 ENCOUNTER — Other Ambulatory Visit: Payer: Self-pay | Admitting: Family

## 2014-01-05 ENCOUNTER — Other Ambulatory Visit: Payer: Self-pay | Admitting: Family

## 2014-01-19 ENCOUNTER — Encounter: Payer: Self-pay | Admitting: Family

## 2014-02-27 ENCOUNTER — Ambulatory Visit: Payer: Commercial Managed Care - HMO | Attending: Gynecology | Admitting: Gynecology

## 2014-02-27 ENCOUNTER — Other Ambulatory Visit (HOSPITAL_COMMUNITY)
Admission: RE | Admit: 2014-02-27 | Discharge: 2014-02-27 | Disposition: A | Discharge status: Home / Self Care | Payer: Commercial Managed Care - HMO | Source: Ambulatory Visit | Attending: Gynecology | Admitting: Gynecology

## 2014-02-27 ENCOUNTER — Encounter: Payer: Self-pay | Admitting: Gynecology

## 2014-02-27 VITALS — BP 122/59 | HR 75 | Temp 98.2°F | Resp 18 | Ht 64.25 in | Wt 207.8 lb

## 2014-02-27 DIAGNOSIS — Z79899 Other long term (current) drug therapy: Secondary | ICD-10-CM | POA: Insufficient documentation

## 2014-02-27 DIAGNOSIS — E785 Hyperlipidemia, unspecified: Secondary | ICD-10-CM | POA: Insufficient documentation

## 2014-02-27 DIAGNOSIS — E119 Type 2 diabetes mellitus without complications: Secondary | ICD-10-CM | POA: Insufficient documentation

## 2014-02-27 DIAGNOSIS — F419 Anxiety disorder, unspecified: Secondary | ICD-10-CM | POA: Diagnosis not present

## 2014-02-27 DIAGNOSIS — Z87891 Personal history of nicotine dependence: Secondary | ICD-10-CM | POA: Diagnosis not present

## 2014-02-27 DIAGNOSIS — Z90722 Acquired absence of ovaries, bilateral: Secondary | ICD-10-CM | POA: Diagnosis not present

## 2014-02-27 DIAGNOSIS — F329 Major depressive disorder, single episode, unspecified: Secondary | ICD-10-CM | POA: Insufficient documentation

## 2014-02-27 DIAGNOSIS — E039 Hypothyroidism, unspecified: Secondary | ICD-10-CM | POA: Insufficient documentation

## 2014-02-27 DIAGNOSIS — Z9079 Acquired absence of other genital organ(s): Secondary | ICD-10-CM | POA: Insufficient documentation

## 2014-02-27 DIAGNOSIS — Z96653 Presence of artificial knee joint, bilateral: Secondary | ICD-10-CM | POA: Insufficient documentation

## 2014-02-27 DIAGNOSIS — Z90710 Acquired absence of both cervix and uterus: Secondary | ICD-10-CM | POA: Insufficient documentation

## 2014-02-27 DIAGNOSIS — Z01411 Encounter for gynecological examination (general) (routine) with abnormal findings: Secondary | ICD-10-CM | POA: Insufficient documentation

## 2014-02-27 DIAGNOSIS — M1990 Unspecified osteoarthritis, unspecified site: Secondary | ICD-10-CM | POA: Insufficient documentation

## 2014-02-27 DIAGNOSIS — I10 Essential (primary) hypertension: Secondary | ICD-10-CM | POA: Insufficient documentation

## 2014-02-27 DIAGNOSIS — C541 Malignant neoplasm of endometrium: Secondary | ICD-10-CM | POA: Insufficient documentation

## 2014-02-27 NOTE — Progress Notes (Signed)
Consult Note: Gyn-Onc   Kara Huynh 68 y.o. female  Chief Complaint  Patient presents with  . 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 NED  Plan: Pap smears obtained. She returned to see [name] in 6 months.  Interval History: Patient returns today for scheduled followup. Since her last visit she's done well. She denies any pelvic pain pressure or bleeding. She denies any GI or GU symptoms. Functional status is excellent her husband is having his last day of chemotherapy today.  HPI: Patient was found to have a grade 1 endometrial carcinoma and underwent a TAH ESL on 02/18/2013 final pathology showed grade 1 endometrial carcinoma that was not invasive. Her postoperative course was uncomplicated  Review of UKGURKY:70 point review of systems is negative except as noted in interval history.   Vitals: Blood pressure 122/59, pulse 75, temperature 98.2 F (36.8 C), temperature source Oral, resp. rate 18, height 5' 4.25" (1.632 m), weight 207 lb 12.8 oz (94.257 kg).  Physical Exam: General : The patient is a healthy woman in no acute distress.  HEENT: normocephalic, extraoccular movements normal; neck is supple without thyromegally  Lynphnodes: Supraclavicular and inguinal nodes not enlarged  Abdomen: Soft, non-tender, no ascites, no organomegally, no masses, no hernias midline incision is well-healed.  Pelvic:  EGBUS: Normal female  Vagina: Normal, no lesions cuff is well healed. Urethra and Bladder: Normal, non-tender  Cervix: Surgically absent  Uterus: Surgically absent  Bi-manual examination: Non-tender; no adenxal masses or nodularity  Rectal: normal sphincter tone, no masses, no blood  Lower extremities: No edema or varicosities. Normal range of motion      No Known Allergies  Past Medical History  Diagnosis Date  . Hyperlipidemia   . Hypertension   . Anxiety   . Depression   . Hypothyroidism   . Type 2 diabetes  mellitus   . Arthritis   . Endometrial polyp 02-17-13    11'14 Dx. endometrial cancer  . Cancer     uterine    Past Surgical History  Procedure Laterality Date  . Total knee arthroplasty Bilateral   . Breast surgery      biopsy  . Laparoscopy for ectopic pregnancy  1977    SALPINGOSTOMY (SIDE UNKNOWN)  . Appendectomy    . Dilitation & currettage/hystroscopy with versapoint resection N/A 01/31/2013    Procedure: DILATATION & CURETTAGE/HYSTEROSCOPY WITH CERVICAL BLOCK;  Surgeon: Marylynn Pearson, MD;  Location: [facility];  Service: Gynecology;  Laterality: N/A;  . Cataract extraction, bilateral Bilateral 02-17-13    bilateral  . Laparotomy Bilateral 02/18/2013    Procedure: EXPLORATORY LAPAROTOMY TOTAL ABDMONIAL HYSTERECTOMY BILATERAL SALPINGO OOPHORECTOMY ;  Surgeon: Alvino Chapel, MD;  Location: [facility];  Service: Gynecology;  Laterality: Bilateral;  . Total abdominal hysterectomy w/ bilateral salpingoophorectomy      Current Outpatient Prescriptions  Medication Sig Dispense Refill  . atorvastatin (LIPITOR) 20 MG tablet TAKE 1 TABLET BY MOUTH EVERY DAY 90 tablet 1  . Cholecalciferol (VITAMIN D) 2000 UNITS CAPS Take 1 capsule by mouth daily.     . Cyanocobalamin 6000 MCG SUBL Place 1 tablet under the tongue daily.     Marland Kitchen levothyroxine (SYNTHROID, LEVOTHROID) 25 MCG tablet TAKE 1 TABLET EVERY MORNING AS DIRECTED 30 tablet 3  . lisinopril (PRINIVIL,ZESTRIL) 20 MG tablet TAKE 1 TABLET (20 MG TOTAL) BY MOUTH DAILY. 90 tablet 1  . meloxicam (MOBIC) 15 MG tablet TAKE 1 TABLET BY MOUTH EVERY DAY 30 tablet  4  . metFORMIN (GLUCOPHAGE) 500 MG tablet TAKE 1 TABLET BY MOUTH TWICE A DAY WITH MEALS 180 tablet 1  . sertraline (ZOLOFT) 100 MG tablet TAKE 1.5 TABLETS (150 MG TOTAL) BY MOUTH DAILY. 135 tablet 0   No current facility-administered medications for this visit.    History   Social History  . Marital Status: Married    Spouse Name: N/A    Number of Children: N/A  . Years of  Education: N/A   Occupational History  . Not on file.   Social History Main Topics  . Smoking status: Former Smoker    Quit date: 07/04/2009  . Smokeless tobacco: Never Used  . Alcohol Use: Yes     Comment: twice monthly  . Drug Use: No  . Sexual Activity:    Partners: Male    Birth Control/ Protection: Post-menopausal   Other Topics Concern  . Not on file   Social History Narrative    Family History  Problem Relation Age of Onset  . Arthritis Mother   . Mental illness Mother   . Diabetes Mother   . Cancer Father     colon, lung, and prostate  . Arthritis Father   . Hyperlipidemia Father   . Stroke Father   . Heart disease Father   . Hypertension Father   . Colitis Father   . Esophageal cancer Neg Hx   . Rectal cancer Neg Hx   . Stomach cancer Neg Hx       CLARKE-PEARSON,Shulamit Donofrio L, MD 02/27/2014, 9:45 AM

## 2014-02-27 NOTE — Patient Instructions (Signed)
We will see you back in our office in 6 months.

## 2014-03-03 LAB — CYTOLOGY - PAP

## 2014-03-06 ENCOUNTER — Telehealth: Payer: Self-pay | Admitting: Gynecologic Oncology

## 2014-03-06 NOTE — Telephone Encounter (Signed)
Pt notified about pap results: negative.  No questions or concerns voiced. 

## 2014-05-01 ENCOUNTER — Encounter: Payer: Self-pay | Admitting: Family

## 2014-05-01 ENCOUNTER — Ambulatory Visit (INDEPENDENT_AMBULATORY_CARE_PROVIDER_SITE_OTHER): Payer: Commercial Managed Care - HMO | Admitting: Family

## 2014-05-01 VITALS — BP 110/70 | Temp 98.2°F | Ht 64.25 in | Wt 202.8 lb

## 2014-05-01 DIAGNOSIS — K5900 Constipation, unspecified: Secondary | ICD-10-CM

## 2014-05-01 DIAGNOSIS — E7800 Pure hypercholesterolemia, unspecified: Secondary | ICD-10-CM

## 2014-05-01 DIAGNOSIS — I10 Essential (primary) hypertension: Secondary | ICD-10-CM

## 2014-05-01 DIAGNOSIS — E119 Type 2 diabetes mellitus without complications: Secondary | ICD-10-CM

## 2014-05-01 DIAGNOSIS — E039 Hypothyroidism, unspecified: Secondary | ICD-10-CM

## 2014-05-01 DIAGNOSIS — F329 Major depressive disorder, single episode, unspecified: Secondary | ICD-10-CM | POA: Diagnosis not present

## 2014-05-01 DIAGNOSIS — F32A Depression, unspecified: Secondary | ICD-10-CM

## 2014-05-01 LAB — LIPID PANEL
CHOL/HDL RATIO: 4
Cholesterol: 163 mg/dL (ref 0–200)
HDL: 44.9 mg/dL (ref >39.00)
LDL CALC: 87 mg/dL (ref 0–99)
NonHDL: 118.1
Triglycerides: 158 mg/dL — ABNORMAL HIGH (ref 0.0–149.0)
VLDL: 31.6 mg/dL (ref 0.0–40.0)

## 2014-05-01 LAB — BASIC METABOLIC PANEL
BUN: 38 mg/dL — ABNORMAL HIGH (ref 6–23)
CO2: 27 meq/L (ref 19–32)
CREATININE: 1.68 mg/dL — AB (ref 0.40–1.20)
Calcium: 10.1 mg/dL (ref 8.4–10.5)
Chloride: 100 mEq/L (ref 96–112)
GFR: 31.96 mL/min — ABNORMAL LOW (ref >60.00)
GLUCOSE: 101 mg/dL — AB (ref 70–99)
POTASSIUM: 4.4 meq/L (ref 3.5–5.1)
Sodium: 137 mEq/L (ref 135–145)

## 2014-05-01 LAB — HEPATIC FUNCTION PANEL
ALT: 16 U/L (ref 0–35)
AST: 18 U/L (ref 0–37)
Albumin: 4.8 g/dL (ref 3.5–5.2)
Alkaline Phosphatase: 57 U/L (ref 39–117)
BILIRUBIN DIRECT: 0.1 mg/dL (ref 0.0–0.3)
Total Bilirubin: 0.6 mg/dL (ref 0.2–1.2)
Total Protein: 7.9 g/dL (ref 6.0–8.3)

## 2014-05-01 LAB — TSH: TSH: 1.15 u[IU]/mL (ref 0.35–4.50)

## 2014-05-01 LAB — HEMOGLOBIN A1C: Hgb A1c MFr Bld: 6.3 % (ref 4.6–6.5)

## 2014-05-01 MED ORDER — LEVOTHYROXINE SODIUM 25 MCG PO TABS: ORAL_TABLET | ORAL | Status: DC

## 2014-05-01 MED ORDER — SERTRALINE HCL 100 MG PO TABS
ORAL_TABLET | ORAL | Status: DC
Start: 2014-05-01 — End: 2014-06-01

## 2014-05-01 MED ORDER — MELOXICAM 15 MG PO TABS: 15.0000 mg | ORAL_TABLET | Freq: Every day | ORAL | Status: DC

## 2014-05-01 MED ORDER — LISINOPRIL 20 MG PO TABS: ORAL_TABLET | ORAL | Status: DC

## 2014-05-01 MED ORDER — CYANOCOBALAMIN 6000 MCG SL SUBL: 1.0000 | SUBLINGUAL_TABLET | Freq: Every day | SUBLINGUAL | Status: DC

## 2014-05-01 MED ORDER — ATORVASTATIN CALCIUM 20 MG PO TABS: ORAL_TABLET | ORAL | Status: DC

## 2014-05-01 MED ORDER — METFORMIN HCL 500 MG PO TABS: ORAL_TABLET | ORAL | Status: DC

## 2014-05-01 NOTE — Progress Notes (Signed)
Pre visit review using our clinic review tool, if applicable. No additional management support is needed unless otherwise documented below in the visit note. 

## 2014-05-01 NOTE — Patient Instructions (Signed)

## 2014-05-01 NOTE — Progress Notes (Signed)
Subjective:    Patient ID: Kara Huynh, female    DOB: 1945-10-04, 69 y.o.   MRN: [id number]  HPI 69 year old white female, former smoker with a history of hypertension, type 2 diabetes, depression, hypothyroidism, hypercholesterolemia, vitamin D deficiency is in today for recheck. Reports doing well. Does not routinely check blood glucose. Reports not currently exercising. Husband has been sick with cancer and she has been taking care of him over the last year. Also has occasional constipation. Reports decreased water intake and decreased mobility. Has not taken anything for relief. Reports sporadic muscle cramps that last a few seconds and goes away. This is ongoing over the last 2-3 months.   Review of Systems  Constitutional: Negative.   HENT: Negative.   Respiratory: Negative.   Cardiovascular: Negative.   Gastrointestinal: Negative.   Endocrine: Negative.   Genitourinary: Negative.   Musculoskeletal: Negative.   Skin: Negative.   Allergic/Immunologic: Negative.   Neurological: Negative.   Hematological: Negative.   Psychiatric/Behavioral: Negative.   All other systems reviewed and are negative.  Past Medical History  Diagnosis Date  . Hyperlipidemia   . Hypertension   . Anxiety   . Depression   . Hypothyroidism   . Type 2 diabetes mellitus   . Arthritis   . Endometrial polyp 02-17-13    11'14 Dx. endometrial cancer  . Cancer     uterine    History   Social History  . Marital Status: Married    Spouse Name: N/A  . Number of Children: N/A  . Years of Education: N/A   Occupational History  . Not on file.   Social History Main Topics  . Smoking status: Former Smoker    Quit date: 07/04/2009  . Smokeless tobacco: Never Used  . Alcohol Use: Yes     Comment: twice monthly  . Drug Use: No  . Sexual Activity:    Partners: Male    Birth Control/ Protection: Post-menopausal   Other Topics Concern  . Not on file   Social History Narrative    Past  Surgical History  Procedure Laterality Date  . Total knee arthroplasty Bilateral   . Breast surgery      biopsy  . Laparoscopy for ectopic pregnancy  1977    SALPINGOSTOMY (SIDE UNKNOWN)  . Appendectomy    . Dilitation & currettage/hystroscopy with versapoint resection N/A 01/31/2013    Procedure: DILATATION & CURETTAGE/HYSTEROSCOPY WITH CERVICAL BLOCK;  Surgeon: Marylynn Pearson, MD;  Location: [facility];  Service: Gynecology;  Laterality: N/A;  . Cataract extraction, bilateral Bilateral 02-17-13    bilateral  . Laparotomy Bilateral 02/18/2013    Procedure: EXPLORATORY LAPAROTOMY TOTAL ABDMONIAL HYSTERECTOMY BILATERAL SALPINGO OOPHORECTOMY ;  Surgeon: Alvino Chapel, MD;  Location: [facility];  Service: Gynecology;  Laterality: Bilateral;  . Total abdominal hysterectomy w/ bilateral salpingoophorectomy      Family History  Problem Relation Age of Onset  . Arthritis Mother   . Mental illness Mother   . Diabetes Mother   . Cancer Father     colon, lung, and prostate  . Arthritis Father   . Hyperlipidemia Father   . Stroke Father   . Heart disease Father   . Hypertension Father   . Colitis Father   . Esophageal cancer Neg Hx   . Rectal cancer Neg Hx   . Stomach cancer Neg Hx     No Known Allergies  Current Outpatient Prescriptions on File Prior to Visit  Medication Sig Dispense Refill  .  Cholecalciferol (VITAMIN D) 2000 UNITS CAPS Take 1 capsule by mouth daily.      No current facility-administered medications on file prior to visit.    BP 110/70 mmHg  Temp(Src) 98.2 F (36.8 C) (Oral)  Ht 5' 4.25" (1.632 m)  Wt 202 lb 12.8 oz (91.989 kg)  BMI 34.54 kg/m2chart    Objective:   Physical Exam  Constitutional: She is oriented to person, place, and time. She appears well-developed and well-nourished.  HENT:  Right Ear: External ear normal.  Left Ear: External ear normal.  Nose: Nose normal.  Mouth/Throat: Oropharynx is clear and moist.  Neck: Normal range of motion.  Neck supple. No thyromegaly present.  Cardiovascular: Normal rate, regular rhythm and normal heart sounds.   Pulmonary/Chest: Effort normal and breath sounds normal.  Abdominal: Soft. Bowel sounds are normal.  Musculoskeletal: Normal range of motion.  Neurological: She is alert and oriented to person, place, and time. She has normal reflexes.  Skin: Skin is warm and dry.  Psychiatric: She has a normal mood and affect.          Assessment & Plan:  Kara Huynh was seen today for follow-up.  Diagnoses and all orders for this visit:  Type 2 diabetes mellitus without complication Orders: -     Hemoglobin A1c -     Basic Metabolic Panel -     Hepatic Function Panel  Pure hypercholesterolemia Orders: -     Hepatic Function Panel -     Lipid Panel  Hypothyroidism, unspecified hypothyroidism type Orders: -     Hepatic Function Panel -     TSH  Depression  Essential hypertension  Constipation, unspecified constipation type  Other orders -     sertraline (ZOLOFT) 100 MG tablet; TAKE 1.5 TABLETS (150 MG TOTAL) BY MOUTH DAILY. -     metFORMIN (GLUCOPHAGE) 500 MG tablet; TAKE 1 TABLET BY MOUTH TWICE A DAY WITH MEALS -     meloxicam (MOBIC) 15 MG tablet; Take 1 tablet (15 mg total) by mouth daily. -     lisinopril (PRINIVIL,ZESTRIL) 20 MG tablet; TAKE 1 TABLET (20 MG TOTAL) BY MOUTH DAILY. -     levothyroxine (SYNTHROID, LEVOTHROID) 25 MCG tablet; TAKE 1 TABLET EVERY MORNING AS DIRECTED -     atorvastatin (LIPITOR) 20 MG tablet; TAKE 1 TABLET BY MOUTH EVERY DAY -     Cyanocobalamin 6000 MCG SUBL; Place 1 tablet (6,000 mcg total) under the tongue daily.   Encouraged healthy diet, exercise. Recheck in 4-6 months and sooner as needed.

## 2014-05-13 ENCOUNTER — Other Ambulatory Visit: Payer: Self-pay | Admitting: Family

## 2014-05-31 ENCOUNTER — Other Ambulatory Visit: Payer: Self-pay | Admitting: Family

## 2014-06-11 ENCOUNTER — Telehealth: Payer: Self-pay | Admitting: Family

## 2014-06-11 NOTE — Telephone Encounter (Signed)
Lm on vm to cb and sch w/ cory

## 2014-06-29 ENCOUNTER — Telehealth: Payer: Self-pay | Admitting: Family

## 2014-06-29 DIAGNOSIS — M25511 Pain in right shoulder: Secondary | ICD-10-CM

## 2014-06-29 NOTE — Telephone Encounter (Signed)
Pt called and ask if you would give her a call please.
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
## 2014-06-30 ENCOUNTER — Telehealth: Payer: Self-pay | Admitting: Family

## 2014-06-30 ENCOUNTER — Ambulatory Visit (INDEPENDENT_AMBULATORY_CARE_PROVIDER_SITE_OTHER)
Admission: RE | Admit: 2014-06-30 | Discharge: 2014-06-30 | Disposition: A | Discharge status: Home / Self Care | Payer: Commercial Managed Care - HMO | Source: Ambulatory Visit | Attending: Family | Admitting: Family

## 2014-06-30 DIAGNOSIS — M25511 Pain in right shoulder: Secondary | ICD-10-CM | POA: Diagnosis not present

## 2014-06-30 DIAGNOSIS — M19011 Primary osteoarthritis, right shoulder: Secondary | ICD-10-CM | POA: Diagnosis not present

## 2014-06-30 NOTE — Telephone Encounter (Signed)
Pt called and said pick a good place

## 2014-06-30 NOTE — Telephone Encounter (Signed)
Pt aware and appointment scheduled. 

## 2014-06-30 NOTE — Telephone Encounter (Signed)
Noted  

## 2014-06-30 NOTE — Telephone Encounter (Signed)
Xray right shoulder xray and office visit tomorrow

## 2014-07-01 ENCOUNTER — Ambulatory Visit (INDEPENDENT_AMBULATORY_CARE_PROVIDER_SITE_OTHER): Payer: Commercial Managed Care - HMO | Admitting: Family

## 2014-07-01 ENCOUNTER — Encounter: Payer: Self-pay | Admitting: Family

## 2014-07-01 VITALS — BP 112/62 | HR 89 | Temp 98.1°F | Ht 64.25 in | Wt 206.0 lb

## 2014-07-01 DIAGNOSIS — M19011 Primary osteoarthritis, right shoulder: Secondary | ICD-10-CM | POA: Diagnosis not present

## 2014-07-01 DIAGNOSIS — M25511 Pain in right shoulder: Secondary | ICD-10-CM | POA: Diagnosis not present

## 2014-07-01 MED ORDER — METHYLPREDNISOLONE ACETATE 80 MG/ML IJ SUSP: 80.0000 mg | Freq: Once | INTRAMUSCULAR | Status: DC

## 2014-07-01 NOTE — Progress Notes (Signed)
Pre visit review using our clinic review tool, if applicable. No additional management support is needed unless otherwise documented below in the visit note. 

## 2014-07-01 NOTE — Patient Instructions (Signed)

## 2014-07-01 NOTE — Progress Notes (Signed)
Subjective:    Patient ID: Kara Huynh, female    DOB: 10-12-1945, 69 y.o.   MRN: [id number]  HPI 69 year old white female, nonsmoker, is in today with c/o right shoulder pain x 3-4 days. She called the office with concerns yesterday and was sent for an xray that is showing osteoarthritis. She has a history of ostearthritis. Pain is better today but is about a 6/10 worse with movement. Has not taken any medication for relief. Pain is achy. Has a history of Hyperlipidemia, Type 2 DM, and Depression.    Review of Systems  Respiratory: Negative.   Cardiovascular: Negative.  Negative for chest pain, palpitations and leg swelling.  Gastrointestinal: Negative.   Musculoskeletal: Positive for arthralgias. Negative for back pain and joint swelling.       Right shoulder pain   Skin: Negative.   Neurological: Negative for numbness.  Hematological: Negative.   Psychiatric/Behavioral: Negative.   All other systems reviewed and are negative.  Past Medical History  Diagnosis Date  . Hyperlipidemia   . Hypertension   . Anxiety   . Depression   . Hypothyroidism   . Type 2 diabetes mellitus   . Arthritis   . Endometrial polyp 02-17-13    11'14 Dx. endometrial cancer  . Cancer     uterine    History   Social History  . Marital Status: Married    Spouse Name: N/A  . Number of Children: N/A  . Years of Education: N/A   Occupational History  . Not on file.   Social History Main Topics  . Smoking status: Former Smoker    Quit date: 07/04/2009  . Smokeless tobacco: Never Used  . Alcohol Use: Yes     Comment: twice monthly  . Drug Use: No  . Sexual Activity:    Partners: Male    Birth Control/ Protection: Post-menopausal   Other Topics Concern  . Not on file   Social History Narrative    Past Surgical History  Procedure Laterality Date  . Total knee arthroplasty Bilateral   . Breast surgery      biopsy  . Laparoscopy for ectopic pregnancy  1977    SALPINGOSTOMY (SIDE  UNKNOWN)  . Appendectomy    . Dilitation & currettage/hystroscopy with versapoint resection N/A 01/31/2013    Procedure: DILATATION & CURETTAGE/HYSTEROSCOPY WITH CERVICAL BLOCK;  Surgeon: Marylynn Pearson, MD;  Location: [facility];  Service: Gynecology;  Laterality: N/A;  . Cataract extraction, bilateral Bilateral 02-17-13    bilateral  . Laparotomy Bilateral 02/18/2013    Procedure: EXPLORATORY LAPAROTOMY TOTAL ABDMONIAL HYSTERECTOMY BILATERAL SALPINGO OOPHORECTOMY ;  Surgeon: Alvino Chapel, MD;  Location: [facility];  Service: Gynecology;  Laterality: Bilateral;  . Total abdominal hysterectomy w/ bilateral salpingoophorectomy      Family History  Problem Relation Age of Onset  . Arthritis Mother   . Mental illness Mother   . Diabetes Mother   . Cancer Father     colon, lung, and prostate  . Arthritis Father   . Hyperlipidemia Father   . Stroke Father   . Heart disease Father   . Hypertension Father   . Colitis Father   . Esophageal cancer Neg Hx   . Rectal cancer Neg Hx   . Stomach cancer Neg Hx     No Known Allergies  Current Outpatient Prescriptions on File Prior to Visit  Medication Sig Dispense Refill  . atorvastatin (LIPITOR) 20 MG tablet TAKE 1 TABLET BY MOUTH EVERY  DAY 90 tablet 1  . Cholecalciferol (VITAMIN D) 2000 UNITS CAPS Take 1 capsule by mouth daily.     . Cyanocobalamin 6000 MCG SUBL Place 1 tablet (6,000 mcg total) under the tongue daily. 90 tablet 1  . levothyroxine (SYNTHROID, LEVOTHROID) 25 MCG tablet TAKE 1 TABLET EVERY MORNING AS DIRECTED 90 tablet 1  . lisinopril (PRINIVIL,ZESTRIL) 20 MG tablet TAKE 1 TABLET (20 MG TOTAL) BY MOUTH DAILY. 90 tablet 1  . meloxicam (MOBIC) 15 MG tablet TAKE 1 TABLET BY MOUTH EVERY DAY 30 tablet 4  . metFORMIN (GLUCOPHAGE) 500 MG tablet TAKE 1 TABLET BY MOUTH TWICE A DAY WITH MEALS 180 tablet 1  . sertraline (ZOLOFT) 100 MG tablet TAKE 1.5 TABLETS (150 MG TOTAL) BY MOUTH DAILY. 135 tablet 1   No current  facility-administered medications on file prior to visit.    BP 112/62 mmHg  Pulse 89  Temp(Src) 98.1 F (36.7 C) (Oral)  Ht 5' 4.25" (1.632 m)  Wt 206 lb (93.441 kg)  BMI 35.08 kg/m2chart    Objective:   Physical Exam  Constitutional: She is oriented to person, place, and time. She appears well-developed and well-nourished.  Neck: Normal range of motion. Neck supple.  Cardiovascular: Normal rate, regular rhythm and normal heart sounds.   Pulmonary/Chest: Effort normal and breath sounds normal.  Musculoskeletal: She exhibits tenderness. She exhibits no edema.       Arms: Neurological: She is alert and oriented to person, place, and time. She has normal reflexes.  Skin: Skin is warm and dry.  Psychiatric: She has a normal mood and affect.      Informed consent obtained and the patient's right shoulder was prepped with betadine. Local anesthesia was obtained with topical spray. Then 40 mg of Depo-Medrol and 1 cc of lidocaine was injected into the joint space. The patient tolerated the procedure without complications. Post injection care discussed with patient.     Assessment & Plan:  Kara Huynh was seen today for shoulder pain.  Diagnoses and all orders for this visit:  Right shoulder pain Orders: -     methylPREDNISolone acetate (DEPO-MEDROL) injection 80 mg; Inject 1 mL (80 mg total) into the articular space once.  Primary osteoarthritis of right shoulder Orders: -     methylPREDNISolone acetate (DEPO-MEDROL) injection 80 mg; Inject 1 mL (80 mg total) into the articular space once.   Advised 72 hours for best relief of symptoms after joint injection. Advised NSAID with food as needed for pain. Call the office if symptoms persist. Recheck as scheduled and sooner as neede.

## 2014-07-26 ENCOUNTER — Other Ambulatory Visit: Payer: Self-pay | Admitting: Family

## 2014-07-28 ENCOUNTER — Telehealth: Payer: Self-pay | Admitting: Family

## 2014-07-28 DIAGNOSIS — E119 Type 2 diabetes mellitus without complications: Secondary | ICD-10-CM

## 2014-07-28 NOTE — Telephone Encounter (Signed)
Pt called to ask for referral to see an Eye Doctor for diabetes    Doctor; [address]   She has an appt 07/29/14 at 3:30

## 2014-07-28 NOTE — Telephone Encounter (Signed)
Referral placed.

## 2014-07-29 DIAGNOSIS — H524 Presbyopia: Secondary | ICD-10-CM | POA: Diagnosis not present

## 2014-07-29 DIAGNOSIS — H35412 Lattice degeneration of retina, left eye: Secondary | ICD-10-CM | POA: Diagnosis not present

## 2014-07-29 DIAGNOSIS — H43811 Vitreous degeneration, right eye: Secondary | ICD-10-CM | POA: Diagnosis not present

## 2014-07-29 DIAGNOSIS — E119 Type 2 diabetes mellitus without complications: Secondary | ICD-10-CM | POA: Diagnosis not present

## 2014-07-29 DIAGNOSIS — H52223 Regular astigmatism, bilateral: Secondary | ICD-10-CM | POA: Diagnosis not present

## 2014-07-29 DIAGNOSIS — H5203 Hypermetropia, bilateral: Secondary | ICD-10-CM | POA: Diagnosis not present

## 2014-08-05 ENCOUNTER — Other Ambulatory Visit: Payer: Self-pay

## 2014-08-05 DIAGNOSIS — Z1231 Encounter for screening mammogram for malignant neoplasm of breast: Secondary | ICD-10-CM

## 2014-08-14 ENCOUNTER — Ambulatory Visit: Payer: Commercial Managed Care - HMO | Attending: Gynecology | Admitting: Gynecology

## 2014-08-14 ENCOUNTER — Other Ambulatory Visit (HOSPITAL_COMMUNITY)
Admission: RE | Admit: 2014-08-14 | Discharge: 2014-08-14 | Disposition: A | Discharge status: Home / Self Care | Payer: Commercial Managed Care - HMO | Source: Ambulatory Visit | Attending: Gynecology | Admitting: Gynecology

## 2014-08-14 ENCOUNTER — Encounter: Payer: Self-pay | Admitting: Gynecology

## 2014-08-14 VITALS — BP 100/50 | HR 72 | Temp 98.1°F | Resp 18 | Ht 64.25 in | Wt 201.8 lb

## 2014-08-14 DIAGNOSIS — Z90722 Acquired absence of ovaries, bilateral: Secondary | ICD-10-CM | POA: Insufficient documentation

## 2014-08-14 DIAGNOSIS — E119 Type 2 diabetes mellitus without complications: Secondary | ICD-10-CM | POA: Insufficient documentation

## 2014-08-14 DIAGNOSIS — Z791 Long term (current) use of non-steroidal anti-inflammatories (NSAID): Secondary | ICD-10-CM | POA: Diagnosis not present

## 2014-08-14 DIAGNOSIS — R110 Nausea: Secondary | ICD-10-CM | POA: Insufficient documentation

## 2014-08-14 DIAGNOSIS — Z90710 Acquired absence of both cervix and uterus: Secondary | ICD-10-CM | POA: Insufficient documentation

## 2014-08-14 DIAGNOSIS — I10 Essential (primary) hypertension: Secondary | ICD-10-CM | POA: Diagnosis not present

## 2014-08-14 DIAGNOSIS — Z08 Encounter for follow-up examination after completed treatment for malignant neoplasm: Secondary | ICD-10-CM | POA: Diagnosis not present

## 2014-08-14 DIAGNOSIS — Z01411 Encounter for gynecological examination (general) (routine) with abnormal findings: Secondary | ICD-10-CM | POA: Diagnosis not present

## 2014-08-14 DIAGNOSIS — Z8542 Personal history of malignant neoplasm of other parts of uterus: Secondary | ICD-10-CM | POA: Insufficient documentation

## 2014-08-14 DIAGNOSIS — F329 Major depressive disorder, single episode, unspecified: Secondary | ICD-10-CM | POA: Insufficient documentation

## 2014-08-14 DIAGNOSIS — R109 Unspecified abdominal pain: Secondary | ICD-10-CM | POA: Diagnosis not present

## 2014-08-14 DIAGNOSIS — Z87891 Personal history of nicotine dependence: Secondary | ICD-10-CM | POA: Insufficient documentation

## 2014-08-14 DIAGNOSIS — C541 Malignant neoplasm of endometrium: Secondary | ICD-10-CM

## 2014-08-14 DIAGNOSIS — E785 Hyperlipidemia, unspecified: Secondary | ICD-10-CM | POA: Insufficient documentation

## 2014-08-14 DIAGNOSIS — F419 Anxiety disorder, unspecified: Secondary | ICD-10-CM | POA: Diagnosis not present

## 2014-08-14 DIAGNOSIS — Z96653 Presence of artificial knee joint, bilateral: Secondary | ICD-10-CM | POA: Diagnosis not present

## 2014-08-14 NOTE — Patient Instructions (Addendum)
Return to see [name] in 6 months. Please discuss your afternoon nausea and abdominal discomfort as well as your blood pressure with her primary care provider. We will call you with the results of your pap smear from today.

## 2014-08-14 NOTE — Progress Notes (Signed)
Consult Note: Gyn-Onc   Kara Huynh 69 y.o. female  Chief Complaint  Patient presents with  . 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 NED  Plan: Pap smears obtained. She returned to see [name] in 6 months. She will discuss her blood pressure and mid afternoon nausea with her primary care provider.  Interval History: Patient returns today for scheduled followup. Since her last visit she's done well. She denies any pelvic pain pressure or bleeding. She denies any GI or GU symptoms. Functional status is excellent her husband is having his last day of chemotherapy today.  Her only new symptom is that for the past month and the early afternoon she has some abdominal discomfort and feels nauseated. She does express some concern about her blood pressure being "low". Review of her past blood pressures over the past year are consistently with diastolics of 08-14.  HPI: Patient was found to have a grade 1 endometrial carcinoma and underwent a TAH BSO on 02/18/2013 final pathology showed grade 1 endometrial carcinoma that was not invasive. Her postoperative course was uncomplicated. No adjuvant therapy was recommended.   Review of Systems:10 point review of systems is negative except as noted in interval history.   Vitals: Blood pressure 100/50, pulse 72, temperature 98.1 F (36.7 C), temperature source Oral, resp. rate 18, height 5' 4.25" (1.632 m), weight 201 lb 12.8 oz (91.536 kg).  Physical Exam: General : The patient is a healthy woman in no acute distress.  HEENT: normocephalic, extraoccular movements normal; neck is supple without thyromegally  Lynphnodes: Supraclavicular and inguinal nodes not enlarged  Abdomen: Soft, non-tender, no ascites, no organomegally, no masses, no hernias midline incision is well-healed.  Pelvic:  EGBUS: Normal female  Vagina: Normal, no lesions cuff is well healed. Urethra and Bladder: Normal, non-tender   Cervix: Surgically absent  Uterus: Surgically absent  Bi-manual examination: Non-tender; no adenxal masses or nodularity  Rectal: normal sphincter tone, no masses, no blood  Lower extremities: No edema or varicosities. Normal range of motion      No Known Allergies  Past Medical History  Diagnosis Date  . Hyperlipidemia   . Hypertension   . Anxiety   . Depression   . Hypothyroidism   . Type 2 diabetes mellitus   . Arthritis   . Endometrial polyp 02-17-13    11'14 Dx. endometrial cancer  . Cancer     uterine    Past Surgical History  Procedure Laterality Date  . Total knee arthroplasty Bilateral   . Breast surgery      biopsy  . Laparoscopy for ectopic pregnancy  1977    SALPINGOSTOMY (SIDE UNKNOWN)  . Appendectomy    . Dilitation & currettage/hystroscopy with versapoint resection N/A 01/31/2013    Procedure: DILATATION & CURETTAGE/HYSTEROSCOPY WITH CERVICAL BLOCK;  Surgeon: Marylynn Pearson, MD;  Location: [facility];  Service: Gynecology;  Laterality: N/A;  . Cataract extraction, bilateral Bilateral 02-17-13    bilateral  . Laparotomy Bilateral 02/18/2013    Procedure: EXPLORATORY LAPAROTOMY TOTAL ABDMONIAL HYSTERECTOMY BILATERAL SALPINGO OOPHORECTOMY ;  Surgeon: Alvino Chapel, MD;  Location: [facility];  Service: Gynecology;  Laterality: Bilateral;  . Total abdominal hysterectomy w/ bilateral salpingoophorectomy      Current Outpatient Prescriptions  Medication Sig Dispense Refill  . atorvastatin (LIPITOR) 20 MG tablet TAKE 1 TABLET BY MOUTH EVERY DAY 90 tablet 1  . Cholecalciferol (VITAMIN D) 2000 UNITS CAPS Take 1 capsule by mouth daily.     Marland Kitchen  Cyanocobalamin 6000 MCG SUBL Place 1 tablet (6,000 mcg total) under the tongue daily. 90 tablet 1  . levothyroxine (SYNTHROID, LEVOTHROID) 25 MCG tablet TAKE 1 TABLET EVERY MORNING AS DIRECTED 90 tablet 1  . lisinopril (PRINIVIL,ZESTRIL) 20 MG tablet TAKE 1 TABLET (20 MG TOTAL) BY MOUTH DAILY. 90 tablet 1  . meloxicam  (MOBIC) 15 MG tablet TAKE 1 TABLET BY MOUTH EVERY DAY 30 tablet 4  . metFORMIN (GLUCOPHAGE) 500 MG tablet TAKE 1 TABLET BY MOUTH TWICE A DAY WITH MEALS 180 tablet 1  . sertraline (ZOLOFT) 100 MG tablet TAKE 1.5 TABLETS (150 MG TOTAL) BY MOUTH DAILY. 135 tablet 1   No current facility-administered medications for this visit.    History   Social History  . Marital Status: Married    Spouse Name: N/A  . Number of Children: N/A  . Years of Education: N/A   Occupational History  . Not on file.   Social History Main Topics  . Smoking status: Former Smoker    Quit date: 07/04/2009  . Smokeless tobacco: Never Used  . Alcohol Use: Yes     Comment: twice monthly  . Drug Use: No  . Sexual Activity:    Partners: Male    Birth Control/ Protection: Post-menopausal   Other Topics Concern  . Not on file   Social History Narrative    Family History  Problem Relation Age of Onset  . Arthritis Mother   . Mental illness Mother   . Diabetes Mother   . Cancer Father     colon, lung, and prostate  . Arthritis Father   . Hyperlipidemia Father   . Stroke Father   . Heart disease Father   . Hypertension Father   . Colitis Father   . Esophageal cancer Neg Hx   . Rectal cancer Neg Hx   . Stomach cancer Neg Hx       CLARKE-PEARSON,Payne Garske L, MD 08/14/2014, 9:25 AM

## 2014-08-18 ENCOUNTER — Ambulatory Visit (INDEPENDENT_AMBULATORY_CARE_PROVIDER_SITE_OTHER): Payer: Commercial Managed Care - HMO | Admitting: Adult Health

## 2014-08-18 ENCOUNTER — Encounter: Payer: Self-pay | Admitting: Adult Health

## 2014-08-18 VITALS — BP 100/68 | HR 97 | Temp 99.1°F | Ht 64.25 in | Wt 203.0 lb

## 2014-08-18 DIAGNOSIS — M25511 Pain in right shoulder: Secondary | ICD-10-CM

## 2014-08-18 DIAGNOSIS — K30 Functional dyspepsia: Secondary | ICD-10-CM

## 2014-08-18 DIAGNOSIS — J069 Acute upper respiratory infection, unspecified: Secondary | ICD-10-CM

## 2014-08-18 DIAGNOSIS — R109 Unspecified abdominal pain: Secondary | ICD-10-CM

## 2014-08-18 LAB — HEPATIC FUNCTION PANEL
ALT: 15 U/L (ref 0–35)
AST: 16 U/L (ref 0–37)
Albumin: 4.4 g/dL (ref 3.5–5.2)
Alkaline Phosphatase: 54 U/L (ref 39–117)
BILIRUBIN DIRECT: 0.1 mg/dL (ref 0.0–0.3)
BILIRUBIN TOTAL: 0.4 mg/dL (ref 0.2–1.2)
TOTAL PROTEIN: 7 g/dL (ref 6.0–8.3)

## 2014-08-18 LAB — CBC
HEMATOCRIT: 38.2 % (ref 36.0–46.0)
HEMOGLOBIN: 12.6 g/dL (ref 12.0–15.0)
MCHC: 33 g/dL (ref 30.0–36.0)
MCV: 90.1 fl (ref 78.0–100.0)
Platelets: 192 10*3/uL (ref 150.0–400.0)
RBC: 4.24 Mil/uL (ref 3.87–5.11)
RDW: 14.9 % (ref 11.5–15.5)
WBC: 5.9 10*3/uL (ref 4.0–10.5)

## 2014-08-18 MED ORDER — BENZONATATE 200 MG PO CAPS: 200.0000 mg | ORAL_CAPSULE | Freq: Three times a day (TID) | ORAL | Status: DC | PRN

## 2014-08-18 NOTE — Progress Notes (Signed)
Pre visit review using our clinic review tool, if applicable. No additional management support is needed unless otherwise documented below in the visit note. 

## 2014-08-18 NOTE — Progress Notes (Signed)
Subjective:    Patient ID: Kara Huynh, female    DOB: March 03, 1946, 69 y.o.   MRN: [id number]  HPI  Kara Huynh is here for "a cold" x less than 24 hours. She has a non productive cough. Sinus drainage. Has a low grade fever 99 F. Sore throat that started last night. Does not smoke.   Also with pain in her stomach for three weeks. Endorses nausea and belching. No burning or sour taste in her mouth. Becomes worse in the afternoon, more so after eating but has pain even if she does not eat. Has not tried anything for the pain.   Saw GYN last week for six month follow up regarding Endometrial cancer - "great check up".    Additionally, she is complaining of right shoulder pain, was given cortisone shot 6 weeks ago which helped but pain has returned.   She is leaving Thursday for [place] to see her grand children.    Review of Systems  Constitutional: Positive for fever. Negative for chills, activity change, appetite change, fatigue and unexpected weight change.  HENT: Positive for congestion, rhinorrhea, sinus pressure, sneezing, sore throat and voice change. Negative for ear discharge, ear pain, facial swelling and hearing loss.   Respiratory: Negative for cough, chest tightness, shortness of breath and wheezing.   Cardiovascular: Negative for chest pain, palpitations and leg swelling.  Gastrointestinal: Positive for nausea and abdominal pain. Negative for vomiting, diarrhea, constipation, blood in stool and rectal pain.  Neurological: Negative for dizziness, speech difficulty and light-headedness.  Hematological: Negative for adenopathy.  All other systems reviewed and are negative.      Objective:   Physical Exam  Constitutional: She is oriented to person, place, and time. She appears well-developed and well-nourished. No distress.  HENT:  Head: Normocephalic and atraumatic.  Right Ear: External ear normal.  Left Ear: External ear normal.  Nose: Nose normal.  Mouth/Throat:  Oropharynx is clear and moist. No oropharyngeal exudate.  No cerumen impaction.   Eyes: Conjunctivae are normal. Pupils are equal, round, and reactive to light. Right eye exhibits no discharge. Left eye exhibits no discharge.  Cardiovascular: Normal rate, regular rhythm, normal heart sounds and intact distal pulses.  Exam reveals no gallop and no friction rub.   No murmur heard. Pulmonary/Chest: Effort normal and breath sounds normal. No respiratory distress. She has no wheezes. She has no rales. She exhibits no tenderness.  Abdominal: Soft. Bowel sounds are normal. She exhibits no distension and no mass. There is no tenderness. There is no rebound and no guarding.  Musculoskeletal: Normal range of motion. She exhibits no edema or tenderness.  Neurological: She is alert and oriented to person, place, and time.  Skin: Skin is warm and dry. She is not diaphoretic.  Psychiatric: She has a normal mood and affect. Her behavior is normal. Judgment and thought content normal.  Nursing note and vitals reviewed.      Assessment & Plan:  1. Acute upper respiratory infection - Likely viral in nature.  - benzonatate (TESSALON) 200 MG capsule; Take 1 capsule (200 mg total) by mouth 3 (three) times daily as needed for cough.  Dispense: 20 capsule; Refill: 0 - OTC Flonase and Zyrtec - Rest and fluids.  - Follow up as needed  2. Stomach pain - No pain currently. No pain with deep palpation. Likely GERD. Is taking Mobic, Ulcer? - Hepatic function panel - CBC - OTC Nexium.  - Will follow up with labs - Follow up  if no improvement or noticed blood in stool  3. Pain in joint, shoulder region, right - Declined referral to ortho at this time. - GFR is decreased.  - Stop Mobic and trial tylenol.

## 2014-08-18 NOTE — Patient Instructions (Addendum)
For the cough  - Tessalon Pearls or over the counter Delsym  For the Upper respitratory infection - Flonase and Zyrtec.  - Get plenty of rest and drink fluids.  - Let me know if no better in 10 days.   For the stomach pain - Over the counter Prilosec, Zantec, or Nexium.   Stop taking Mobic and try tylenol   Upper Respiratory Infection, Adult An upper respiratory infection (URI) is also sometimes known as the common cold. The upper respiratory tract includes the nose, sinuses, throat, trachea, and bronchi. Bronchi are the airways leading to the lungs. Most people improve within 1 week, but symptoms can last up to 2 weeks. A residual cough may last even longer.  CAUSES Many different viruses can infect the tissues lining the upper respiratory tract. The tissues become irritated and inflamed and often become very moist. Mucus production is also common. A cold is contagious. You can easily spread the virus to others by oral contact. This includes kissing, sharing a glass, coughing, or sneezing. Touching your mouth or nose and then touching a surface, which is then touched by another person, can also spread the virus. SYMPTOMS  Symptoms typically develop 1 to 3 days after you come in contact with a cold virus. Symptoms vary from person to person. They may include:  Runny nose.  Sneezing.  Nasal congestion.  Sinus irritation.  Sore throat.  Loss of voice (laryngitis).  Cough.  Fatigue.  Muscle aches.  Loss of appetite.  Headache.  Low-grade fever. DIAGNOSIS  You might diagnose your own cold based on familiar symptoms, since most people get a cold 2 to 3 times a year. Your caregiver can confirm this based on your exam. Most importantly, your caregiver can check that your symptoms are not due to another disease such as strep throat, sinusitis, pneumonia, asthma, or epiglottitis. Blood tests, throat tests, and X-rays are not necessary to diagnose a common cold, but they may  sometimes be helpful in excluding other more serious diseases. Your caregiver will decide if any further tests are required. RISKS AND COMPLICATIONS  You may be at risk for a more severe case of the common cold if you smoke cigarettes, have chronic heart disease (such as heart failure) or lung disease (such as asthma), or if you have a weakened immune system. The very young and very old are also at risk for more serious infections. Bacterial sinusitis, middle ear infections, and bacterial pneumonia can complicate the common cold. The common cold can worsen asthma and chronic obstructive pulmonary disease (COPD). Sometimes, these complications can require emergency medical care and may be life-threatening. PREVENTION  The best way to protect against getting a cold is to practice good hygiene. Avoid oral or hand contact with people with cold symptoms. Wash your hands often if contact occurs. There is no clear evidence that vitamin C, vitamin E, echinacea, or exercise reduces the chance of developing a cold. However, it is always recommended to get plenty of rest and practice good nutrition. TREATMENT  Treatment is directed at relieving symptoms. There is no cure. Antibiotics are not effective, because the infection is caused by a virus, not by bacteria. Treatment may include:  Increased fluid intake. Sports drinks offer valuable electrolytes, sugars, and fluids.  Breathing heated mist or steam (vaporizer or shower).  Eating chicken soup or other clear broths, and maintaining good nutrition.  Getting plenty of rest.  Using gargles or lozenges for comfort.  Controlling fevers with ibuprofen or acetaminophen  as directed by your caregiver.  Increasing usage of your inhaler if you have asthma. Zinc gel and zinc lozenges, taken in the first 24 hours of the common cold, can shorten the duration and lessen the severity of symptoms. Pain medicines may help with fever, muscle aches, and throat pain. A  variety of non-prescription medicines are available to treat congestion and runny nose. Your caregiver can make recommendations and may suggest nasal or lung inhalers for other symptoms.  HOME CARE INSTRUCTIONS   Only take over-the-counter or prescription medicines for pain, discomfort, or fever as directed by your caregiver.  Use a warm mist humidifier or inhale steam from a shower to increase air moisture. This may keep secretions moist and make it easier to breathe.  Drink enough water and fluids to keep your urine clear or pale yellow.  Rest as needed.  Return to work when your temperature has returned to normal or as your caregiver advises. You may need to stay home longer to avoid infecting others. You can also use a face mask and careful hand washing to prevent spread of the virus. SEEK MEDICAL CARE IF:   After the first few days, you feel you are getting worse rather than better.  You need your caregiver's advice about medicines to control symptoms.  You develop chills, worsening shortness of breath, or brown or red sputum. These may be signs of pneumonia.  You develop yellow or brown nasal discharge or pain in the face, especially when you bend forward. These may be signs of sinusitis.  You develop a fever, swollen neck glands, pain with swallowing, or white areas in the back of your throat. These may be signs of strep throat. SEEK IMMEDIATE MEDICAL CARE IF:   You have a fever.  You develop severe or persistent headache, ear pain, sinus pain, or chest pain.  You develop wheezing, a prolonged cough, cough up blood, or have a change in your usual mucus (if you have chronic lung disease).  You develop sore muscles or a stiff neck. Document Released: 08/30/2000 Document Revised: 05/29/2011 Document Reviewed: 06/11/2013 [facility] Patient Information 2015 [place], [place]. This information is not intended to replace advice given to you by your health care provider. Make sure you  discuss any questions you have with your health care provider.

## 2014-08-19 LAB — CYTOLOGY - PAP

## 2014-08-20 ENCOUNTER — Telehealth: Payer: Self-pay | Admitting: *Deleted

## 2014-08-20 NOTE — Telephone Encounter (Signed)
-----   Message from Dorothyann Gibbs, NP sent at 08/19/2014  4:06 PM EDT ----- Please let her know her pap is normal  ----- Message -----    From: [facility] Interface    Sent: 08/19/2014   1:37 PM      To: Dorothyann Gibbs, NP

## 2014-08-20 NOTE — Telephone Encounter (Signed)
Patient notified of results as noted below by Joylene John, NP. No other questions or concerns voiced at this time.

## 2014-09-02 ENCOUNTER — Ambulatory Visit
Admission: RE | Admit: 2014-09-02 | Discharge: 2014-09-02 | Disposition: A | Discharge status: Home / Self Care | Payer: Commercial Managed Care - HMO | Source: Ambulatory Visit

## 2014-09-02 DIAGNOSIS — Z1231 Encounter for screening mammogram for malignant neoplasm of breast: Secondary | ICD-10-CM | POA: Diagnosis not present

## 2014-10-07 ENCOUNTER — Ambulatory Visit (INDEPENDENT_AMBULATORY_CARE_PROVIDER_SITE_OTHER): Payer: Commercial Managed Care - HMO | Admitting: Adult Health

## 2014-10-07 ENCOUNTER — Encounter: Payer: Self-pay | Admitting: Adult Health

## 2014-10-07 VITALS — BP 100/60 | Temp 99.3°F | Ht 64.25 in | Wt 201.8 lb

## 2014-10-07 DIAGNOSIS — I10 Essential (primary) hypertension: Secondary | ICD-10-CM | POA: Diagnosis not present

## 2014-10-07 DIAGNOSIS — E119 Type 2 diabetes mellitus without complications: Secondary | ICD-10-CM

## 2014-10-07 DIAGNOSIS — R413 Other amnesia: Secondary | ICD-10-CM

## 2014-10-07 DIAGNOSIS — Z7189 Other specified counseling: Secondary | ICD-10-CM

## 2014-10-07 DIAGNOSIS — Z23 Encounter for immunization: Secondary | ICD-10-CM

## 2014-10-07 DIAGNOSIS — Z7689 Persons encountering health services in other specified circumstances: Secondary | ICD-10-CM

## 2014-10-07 NOTE — Patient Instructions (Signed)
It was great seeing you today!   Please follow up in February for a complete physical   Please follow up September for diabetes follow up   Follow up when needed.

## 2014-10-07 NOTE — Progress Notes (Signed)
Pre visit review using our clinic review tool, if applicable. No additional management support is needed unless otherwise documented below in the visit note. 

## 2014-10-07 NOTE — Progress Notes (Signed)
HPI:  Kara Huynh is here to establish care. She is a pleasant 69 year old female with the  has a past medical history of Hyperlipidemia; Hypertension; Anxiety; Depression; Hypothyroidism; Type 2 diabetes mellitus; Arthritis; Endometrial polyp (02-17-13); and Cancer.  Last PCP and physical:February 2016 with NP Justin Mend  Has the following chronic problems that require follow up and concerns today:  HTN - Controlled on current medications. She checks it once a week.   Diabetes  - She takes Metformin for her diabetes. Has been taking it for "awhile" She states " I don't even know if I have diabetes."  Lab Results  Component Value Date   HGBA1C 6.3 05/01/2014    Memory - She endorses that she has had issues with her memory and remembering items for the last 10 months. She has been misplacing items at home but contributes this to " my brain seems to be running all the time and I feel like I am always busy."  ROS negative for unless reported above: fevers, chills,feeling poorly, unintentional weight loss, hearing or vision loss, chest pain, palpitations, leg claudication, struggling to breath,Not feeling congested in the chest, no orthopenia, no cough,no wheezing, normal appetite, no soft tissue swelling, no hemoptysis, melena, hematochezia, hematuria, falls, loc, si, or thoughts of self harm.  Immunizations: Nees Prevanar 13 Diet:Does not follow a diet. Not a lot of fried food. Does eat a lot of fish and vegetables. Does eat a lot of sweets Exercise: Does not exercise. Just got Silver Sneakers and plans to start going him.  Colonoscopy: 3 years ago Dexa: Never had  Pap Smear: No abnormal.  Mammogram: 2016 Dentist: Does not go - she is encouraged to see a dentist Eye Doctor: Goes yearly.   Past Medical History  Diagnosis Date  . Hyperlipidemia   . Hypertension   . Anxiety   . Depression   . Hypothyroidism   . Type 2 diabetes mellitus   . Arthritis   . Endometrial polyp 02-17-13     11'14 Dx. endometrial cancer  . Cancer     uterine    Past Surgical History  Procedure Laterality Date  . Total knee arthroplasty Bilateral   . Breast surgery      biopsy  . Laparoscopy for ectopic pregnancy  1977    SALPINGOSTOMY (SIDE UNKNOWN)  . Appendectomy    . Dilitation & currettage/hystroscopy with versapoint resection N/A 01/31/2013    Procedure: DILATATION & CURETTAGE/HYSTEROSCOPY WITH CERVICAL BLOCK;  Surgeon: Marylynn Pearson, MD;  Location: [facility];  Service: Gynecology;  Laterality: N/A;  . Cataract extraction, bilateral Bilateral 02-17-13    bilateral  . Laparotomy Bilateral 02/18/2013    Procedure: EXPLORATORY LAPAROTOMY TOTAL ABDMONIAL HYSTERECTOMY BILATERAL SALPINGO OOPHORECTOMY ;  Surgeon: Alvino Chapel, MD;  Location: [facility];  Service: Gynecology;  Laterality: Bilateral;  . Total abdominal hysterectomy w/ bilateral salpingoophorectomy      Family History  Problem Relation Age of Onset  . Arthritis Mother   . Mental illness Mother   . Diabetes Mother   . Cancer Father     colon, lung, and prostate  . Arthritis Father   . Hyperlipidemia Father   . Stroke Father   . Heart disease Father   . Hypertension Father   . Colitis Father   . Esophageal cancer Neg Hx   . Rectal cancer Neg Hx   . Stomach cancer Neg Hx     History   Social History  . Marital Status: Married  Spouse Name: N/A  . Number of Children: N/A  . Years of Education: N/A   Social History Main Topics  . Smoking status: Former Smoker    Quit date: 07/04/2009  . Smokeless tobacco: Never Used  . Alcohol Use: Yes     Comment: twice monthly  . Drug Use: No  . Sexual Activity:    Partners: Male    Birth Control/ Protection: Post-menopausal   Other Topics Concern  . None   Social History Narrative     Current outpatient prescriptions:  .  atorvastatin (LIPITOR) 20 MG tablet, TAKE 1 TABLET BY MOUTH EVERY DAY, Disp: 90 tablet, Rfl: 1 .  Cholecalciferol (VITAMIN D) 2000  UNITS CAPS, Take 1 capsule by mouth daily. , Disp: , Rfl:  .  Cyanocobalamin 6000 MCG SUBL, Place 1 tablet (6,000 mcg total) under the tongue daily., Disp: 90 tablet, Rfl: 1 .  levothyroxine (SYNTHROID, LEVOTHROID) 25 MCG tablet, TAKE 1 TABLET EVERY MORNING AS DIRECTED, Disp: 90 tablet, Rfl: 1 .  lisinopril (PRINIVIL,ZESTRIL) 20 MG tablet, TAKE 1 TABLET (20 MG TOTAL) BY MOUTH DAILY., Disp: 90 tablet, Rfl: 1 .  meloxicam (MOBIC) 15 MG tablet, TAKE 1 TABLET BY MOUTH EVERY DAY, Disp: 30 tablet, Rfl: 4 .  metFORMIN (GLUCOPHAGE) 500 MG tablet, TAKE 1 TABLET BY MOUTH TWICE A DAY WITH MEALS, Disp: 180 tablet, Rfl: 1 .  sertraline (ZOLOFT) 100 MG tablet, TAKE 1.5 TABLETS (150 MG TOTAL) BY MOUTH DAILY., Disp: 135 tablet, Rfl: 1  EXAM:  Filed Vitals:   10/07/14 1003  BP: 100/60  Temp: 99.3 F (37.4 C)    Body mass index is 34.37 kg/(m^2).  GENERAL: vitals reviewed and listed above, alert, oriented, appears well hydrated and in no acute distress. She is slightly obese around abdomen.   HEENT: atraumatic, conjunttiva clear, no obvious abnormalities on inspection of external nose and ears  NECK: Neck is soft and supple without masses, no adenopathy or thyromegaly, trachea midline, no JVD. Normal range of motion.   LUNGS: clear to auscultation bilaterally, no wheezes, rales or rhonchi, good air movement  CV: Regular rate and rhythm, normal S1/S2, no audible murmurs, gallops, or rubs. No carotid bruit and no peripheral edema.   MS: moves all extremities without noticeable abnormality. No edema noted  Abd: soft/nontender/nondistended/normal bowel sounds   Skin: warm and dry, no rash   Extremities: No clubbing, cyanosis, or edema. Capillary refill is WNL. Pulses intact bilaterally in upper and lower extremities.   Neuro: CN II-XII intact, sensation and reflexes normal throughout, 5/5 muscle strength in bilateral upper and lower extremities. Normal finger to nose. Normal rapid alternating  movements.    PSYCH: pleasant and cooperative, no obvious depression or anxiety  ASSESSMENT AND PLAN: 1. Encounter to establish care - Follow up in February for CPE - Follow up sooner if needed - Start an exercise regimen - Continue to work on diet and incorporate a heart healthy diet.   2. Essential hypertension - Appears controlled on current medication.  - Continue to monitor at home.  - Follow up with any highs or lows.   3. Type 2 diabetes mellitus without complication - Controlled on current medication  - Will check A1c in September - Start monitoring glucose at home  4. Memory disturbance - Will address at next visit.  - Consider PHQ 9 and labs - Not likely dementia or any cognitive impairment. More likely to be busy lifestyle.   5. Need for pneumococcal vaccination  - Pneumococcal conjugate  vaccine 13-valent IM     Discussed the following assessment and plan:  No diagnosis found. -We reviewed the PMH, PSH, FH, SH, Meds and Allergies. -We provided refills for any medications we will prescribe as needed. -We addressed current concerns per orders and patient instructions. -We have asked for records for pertinent exams, studies, vaccines and notes from previous providers. -We have advised patient to follow up per instructions below.   -Patient advised to return or notify a provider immediately if symptoms worsen or persist or new concerns arise.  There are no Patient Instructions on file for this visit.   [employer]

## 2014-10-24 ENCOUNTER — Other Ambulatory Visit: Payer: Self-pay | Admitting: Family

## 2014-10-31 ENCOUNTER — Other Ambulatory Visit: Payer: Self-pay | Admitting: Family

## 2014-11-24 IMAGING — CR DG CHEST 2V
2 series · 2 of 2 positions shown · non-contrast
Comparison: None.

CLINICAL DATA: Preop hysterectomy, previous smoker.

EXAM:
CHEST  2 VIEW

[w chest pa]
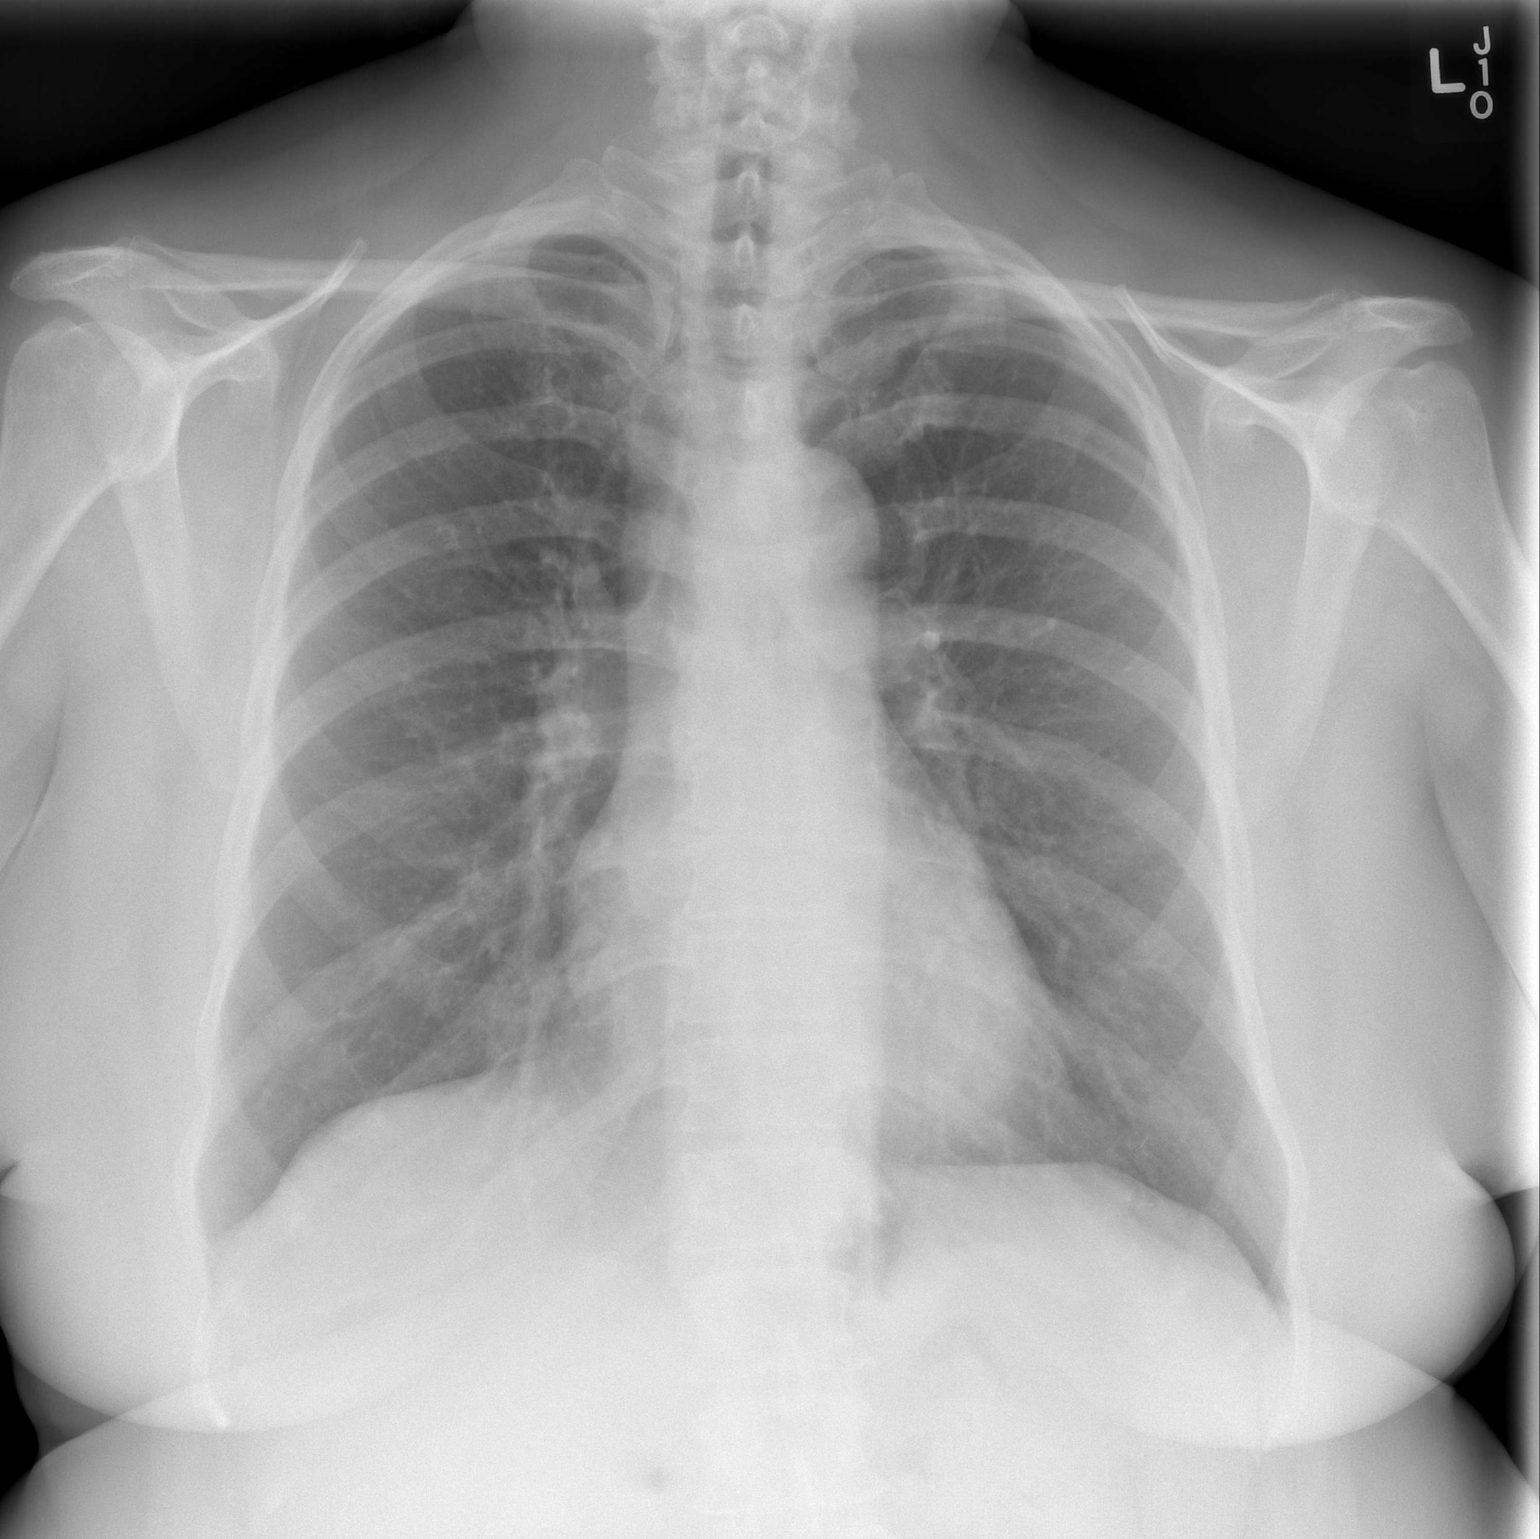

[w chest lat]
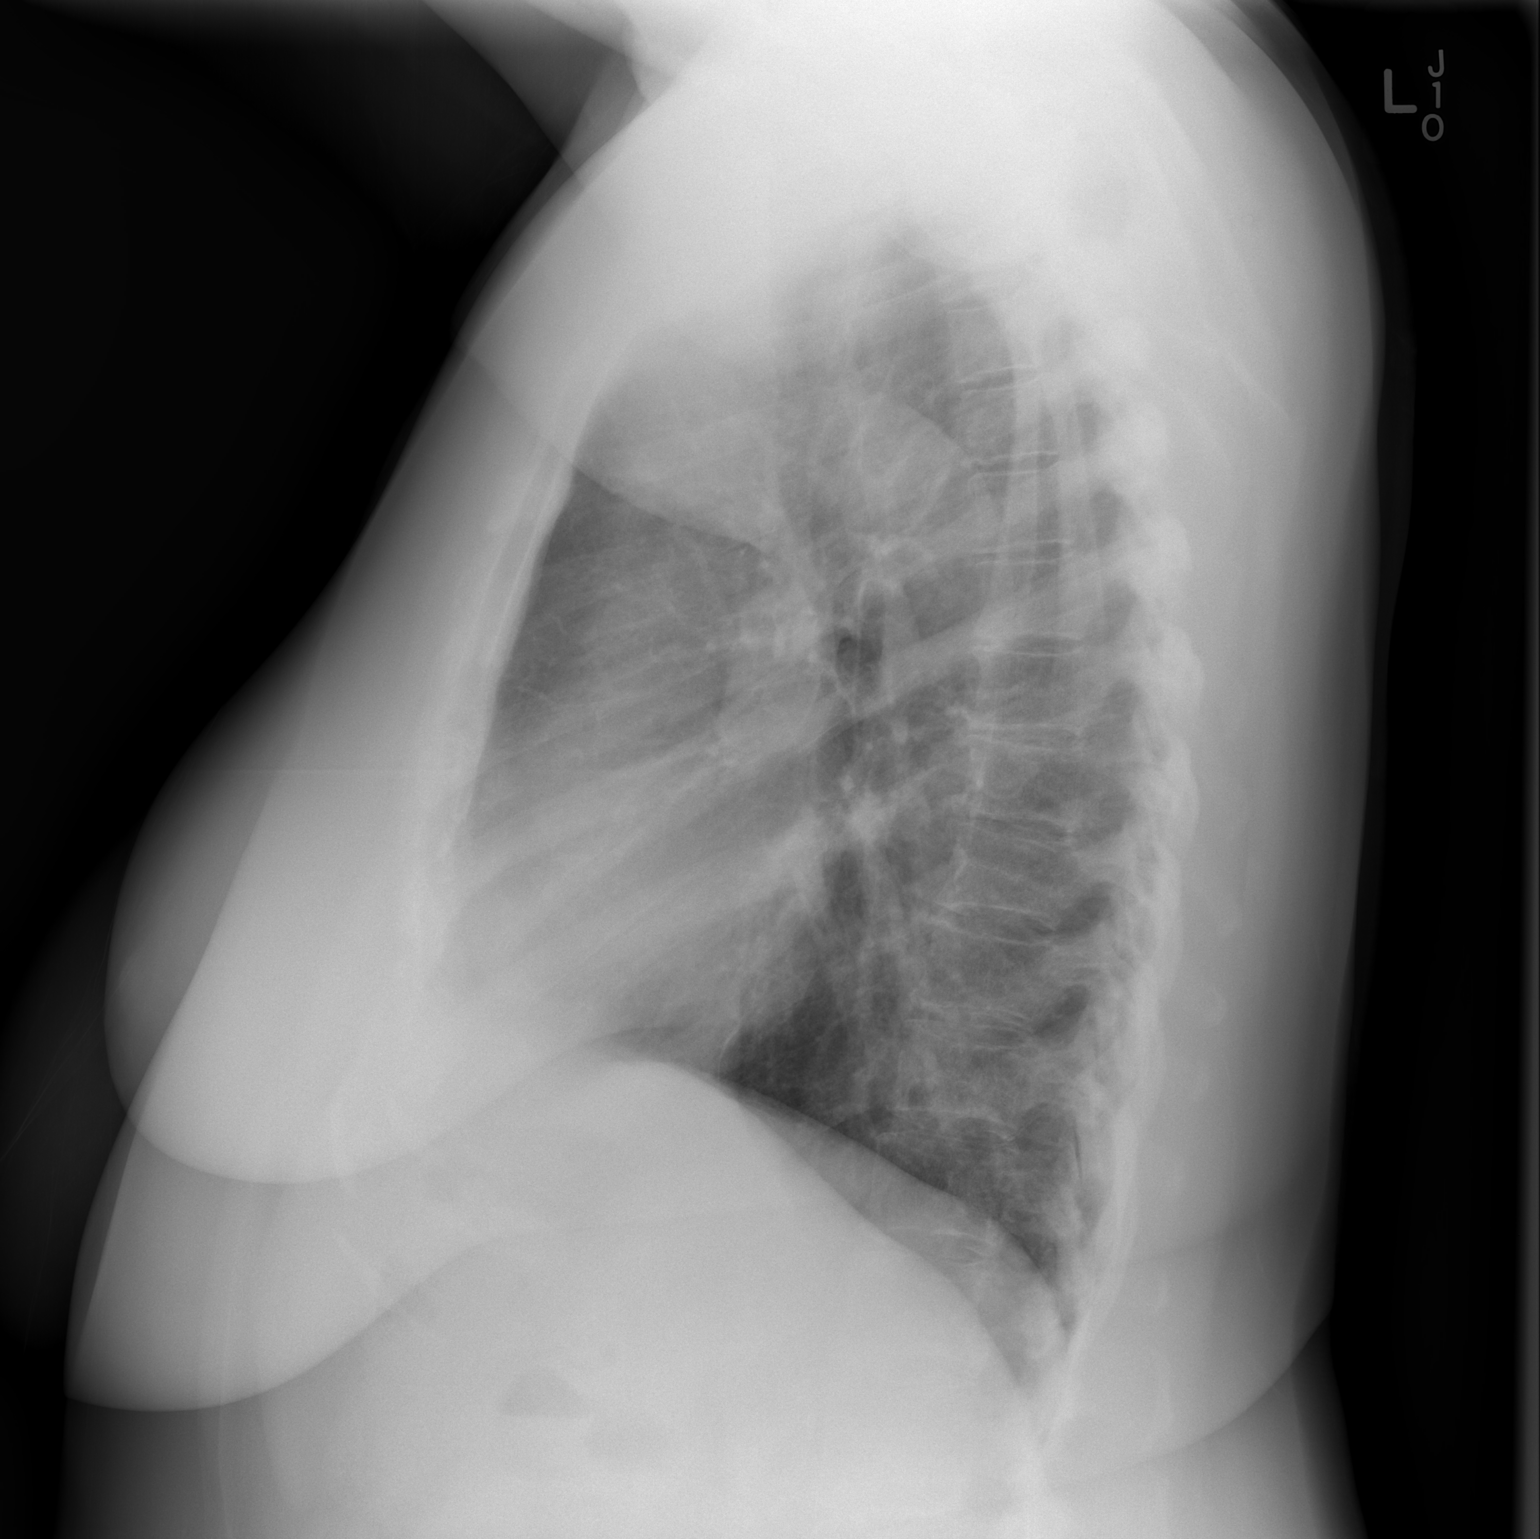

[2 of 2 positions shown; findings below may reference images not displayed]

FINDINGS: The heart size and mediastinal contours are within normal limits.
Both lungs are clear. The visualized skeletal structures are
unremarkable.
IMPRESSION: No active cardiopulmonary disease.

## 2014-11-29 ENCOUNTER — Other Ambulatory Visit: Payer: Self-pay | Admitting: Family

## 2014-12-15 ENCOUNTER — Other Ambulatory Visit: Payer: Self-pay | Admitting: Family

## 2015-01-19 ENCOUNTER — Other Ambulatory Visit: Payer: Self-pay | Admitting: Family

## 2015-01-19 DIAGNOSIS — Z23 Encounter for immunization: Secondary | ICD-10-CM | POA: Diagnosis not present

## 2015-02-05 ENCOUNTER — Ambulatory Visit: Payer: Commercial Managed Care - HMO | Attending: Gynecology | Admitting: Gynecology

## 2015-02-05 ENCOUNTER — Encounter: Payer: Self-pay | Admitting: Gynecology

## 2015-02-05 VITALS — BP 115/56 | HR 80 | Temp 98.5°F | Resp 19 | Ht 64.25 in | Wt 211.0 lb

## 2015-02-05 DIAGNOSIS — Z8542 Personal history of malignant neoplasm of other parts of uterus: Secondary | ICD-10-CM | POA: Diagnosis not present

## 2015-02-05 DIAGNOSIS — C541 Malignant neoplasm of endometrium: Secondary | ICD-10-CM | POA: Diagnosis not present

## 2015-02-05 NOTE — Patient Instructions (Signed)
Plan to follow up with your PCP and Dr. Fermin Schwab in the future if needed.

## 2015-02-05 NOTE — Progress Notes (Signed)
Consult Note: Gyn-Onc   Kara Huynh 69 y.o. female  Chief Complaint  Patient presents with  . 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 NED  Plan:  At this juncture I will release the patient to her primary gynecologist, Dr. Julien Girt, for continuing follow-up. Recommend she have annual exams. Signs and symptoms associated with recurrent endometrial cancer were discussed. She's encouraged to continue to have annual mammograms and obtain a colonoscopy in the very near future.  Interval History: Patient returns today for scheduled followup. Since her last visit she's done well. She denies any pelvic pain pressure or bleeding. She denies any GI or GU symptoms. Functional status is excellent. She has no complaints.    HPI: Patient was found to have a grade 1 endometrial carcinoma and underwent a TAH BSO on 02/18/2013 final pathology showed grade 1 endometrial carcinoma that was not invasive. Her postoperative course was uncomplicated. No adjuvant therapy was recommended.   Review of Systems:10 point review of systems is negative except as noted in interval history.   Vitals: Blood pressure 115/56, pulse 80, temperature 98.5 F (36.9 C), temperature source Oral, resp. rate 19, height 5' 4.25" (1.632 m), weight 211 lb (95.709 kg), SpO2 96 %.  Physical Exam: General : The patient is a healthy woman in no acute distress.  HEENT: normocephalic, extraoccular movements normal; neck is supple without thyromegally  Lynphnodes: Supraclavicular and inguinal nodes not enlarged  Abdomen: Soft, non-tender, no ascites, no organomegally, no masses, no hernias midline incision is well-healed.  Pelvic:  EGBUS: Normal female  Vagina: Normal, no lesions cuff is well healed. Urethra and Bladder: Normal, non-tender  Cervix: Surgically absent  Uterus: Surgically absent  Bi-manual examination: Non-tender; no adenxal masses or nodularity   Rectal: normal sphincter tone, no masses, no blood  Lower extremities: No edema or varicosities. Normal range of motion      No Known Allergies  Past Medical History  Diagnosis Date  . Hyperlipidemia   . Hypertension   . Anxiety   . Depression   . Hypothyroidism   . Type 2 diabetes mellitus ([place])   . Arthritis   . Endometrial polyp 02-17-13    11'14 Dx. endometrial cancer  . Cancer [facility])     uterine    Past Surgical History  Procedure Laterality Date  . Total knee arthroplasty Bilateral   . Breast surgery      biopsy  . Laparoscopy for ectopic pregnancy  1977    SALPINGOSTOMY (SIDE UNKNOWN)  . Appendectomy    . Dilitation & currettage/hystroscopy with versapoint resection N/A 01/31/2013    Procedure: DILATATION & CURETTAGE/HYSTEROSCOPY WITH CERVICAL BLOCK;  Surgeon: Marylynn Pearson, MD;  Location: [facility];  Service: Gynecology;  Laterality: N/A;  . Cataract extraction, bilateral Bilateral 02-17-13    bilateral  . Laparotomy Bilateral 02/18/2013    Procedure: EXPLORATORY LAPAROTOMY TOTAL ABDMONIAL HYSTERECTOMY BILATERAL SALPINGO OOPHORECTOMY ;  Surgeon: Alvino Chapel, MD;  Location: [facility];  Service: Gynecology;  Laterality: Bilateral;  . Total abdominal hysterectomy w/ bilateral salpingoophorectomy      Current Outpatient Prescriptions  Medication Sig Dispense Refill  . atorvastatin (LIPITOR) 20 MG tablet TAKE 1 TABLET BY MOUTH EVERY DAY 90 tablet 1  . Cholecalciferol (VITAMIN D) 2000 UNITS CAPS Take 1 capsule by mouth daily.     . Cyanocobalamin 6000 MCG SUBL Place 1 tablet (6,000 mcg total) under the tongue daily. 90 tablet 1  .  levothyroxine (SYNTHROID, LEVOTHROID) 25 MCG tablet TAKE 1 TABLET EVERY MORNING AS DIRECTED 90 tablet 1  . lisinopril (PRINIVIL,ZESTRIL) 20 MG tablet TAKE 1 TABLET (20 MG TOTAL) BY MOUTH DAILY. 90 tablet 1  . meloxicam (MOBIC) 15 MG tablet TAKE 1 TABLET BY MOUTH EVERY DAY 30 tablet 4  . metFORMIN (GLUCOPHAGE) 500 MG tablet TAKE 1  TABLET BY MOUTH TWICE A DAY WITH MEALS 180 tablet 1  . sertraline (ZOLOFT) 100 MG tablet TAKE 1.5 TABLETS (150 MG TOTAL) BY MOUTH DAILY. 135 tablet 1  . sertraline (ZOLOFT) 100 MG tablet TAKE 1.5 TABLETS (150 MG TOTAL) BY MOUTH DAILY. 135 tablet 1   No current facility-administered medications for this visit.    Social History   Social History  . Marital Status: Married    Spouse Name: N/A  . Number of Children: N/A  . Years of Education: N/A   Occupational History  . Not on file.   Social History Main Topics  . Smoking status: Former Smoker    Quit date: 07/04/2009  . Smokeless tobacco: Never Used  . Alcohol Use: Yes     Comment: twice monthly  . Drug Use: No  . Sexual Activity:    Partners: Male    Birth Control/ Protection: Post-menopausal   Other Topics Concern  . Not on file   Social History Narrative   Retired from being an Web designer   Married for 35 years   3 step children and 1 adopted child. They live all over the [name]    Family History  Problem Relation Age of Onset  . Arthritis Mother   . Mental illness Mother   . Diabetes Mother   . Cancer Father     colon, lung, and prostate  . Arthritis Father   . Hyperlipidemia Father   . Stroke Father   . Heart disease Father   . Hypertension Father   . Colitis Father   . Esophageal cancer Neg Hx   . Rectal cancer Neg Hx   . Stomach cancer Neg Hx       CLARKE-PEARSON,Ellaina Schuler L, MD 02/05/2015, 10:04 AM

## 2015-04-08 ENCOUNTER — Other Ambulatory Visit: Payer: Self-pay | Admitting: Adult Health

## 2015-06-07 ENCOUNTER — Other Ambulatory Visit: Payer: Self-pay | Admitting: Family

## 2015-06-12 ENCOUNTER — Other Ambulatory Visit: Payer: Self-pay | Admitting: Family

## 2015-07-07 ENCOUNTER — Other Ambulatory Visit: Payer: Self-pay | Admitting: Adult Health

## 2015-07-07 ENCOUNTER — Encounter: Payer: Self-pay | Admitting: Adult Health

## 2015-07-07 ENCOUNTER — Ambulatory Visit (INDEPENDENT_AMBULATORY_CARE_PROVIDER_SITE_OTHER): Payer: Commercial Managed Care - HMO | Admitting: Adult Health

## 2015-07-07 VITALS — BP 100/50 | Temp 98.1°F | Ht 64.25 in | Wt 211.5 lb

## 2015-07-07 DIAGNOSIS — E559 Vitamin D deficiency, unspecified: Secondary | ICD-10-CM | POA: Diagnosis not present

## 2015-07-07 DIAGNOSIS — Z794 Long term (current) use of insulin: Secondary | ICD-10-CM

## 2015-07-07 DIAGNOSIS — F329 Major depressive disorder, single episode, unspecified: Secondary | ICD-10-CM | POA: Diagnosis not present

## 2015-07-07 DIAGNOSIS — I10 Essential (primary) hypertension: Secondary | ICD-10-CM

## 2015-07-07 DIAGNOSIS — E119 Type 2 diabetes mellitus without complications: Secondary | ICD-10-CM

## 2015-07-07 DIAGNOSIS — Z0000 Encounter for general adult medical examination without abnormal findings: Secondary | ICD-10-CM | POA: Diagnosis not present

## 2015-07-07 DIAGNOSIS — E038 Other specified hypothyroidism: Secondary | ICD-10-CM

## 2015-07-07 DIAGNOSIS — N289 Disorder of kidney and ureter, unspecified: Secondary | ICD-10-CM

## 2015-07-07 DIAGNOSIS — F32A Depression, unspecified: Secondary | ICD-10-CM

## 2015-07-07 LAB — BASIC METABOLIC PANEL
BUN: 25 mg/dL — AB (ref 6–23)
CALCIUM: 9.5 mg/dL (ref 8.4–10.5)
CHLORIDE: 102 meq/L (ref 96–112)
CO2: 30 meq/L (ref 19–32)
CREATININE: 1.64 mg/dL — AB (ref 0.40–1.20)
GFR: 32.75 mL/min — ABNORMAL LOW (ref >60.00)
GLUCOSE: 109 mg/dL — AB (ref 70–99)
Potassium: 4.5 mEq/L (ref 3.5–5.1)
Sodium: 141 mEq/L (ref 135–145)

## 2015-07-07 LAB — POC URINALSYSI DIPSTICK (AUTOMATED)
Glucose, UA: NEGATIVE
KETONES UA: NEGATIVE
LEUKOCYTES UA: NEGATIVE
Nitrite, UA: NEGATIVE
PROTEIN UA: NEGATIVE
Urobilinogen, UA: 0.2
pH, UA: 5.5

## 2015-07-07 LAB — CBC WITH DIFFERENTIAL/PLATELET
BASOS ABS: 0 10*3/uL (ref 0.0–0.1)
BASOS PCT: 0.5 % (ref 0.0–3.0)
EOS ABS: 0.3 10*3/uL (ref 0.0–0.7)
Eosinophils Relative: 5.3 % — ABNORMAL HIGH (ref 0.0–5.0)
HEMATOCRIT: 37.6 % (ref 36.0–46.0)
Hemoglobin: 12.5 g/dL (ref 12.0–15.0)
LYMPHS ABS: 1.8 10*3/uL (ref 0.7–4.0)
LYMPHS PCT: 31 % (ref 12.0–46.0)
MCHC: 33.3 g/dL (ref 30.0–36.0)
MCV: 87 fl (ref 78.0–100.0)
MONO ABS: 0.3 10*3/uL (ref 0.1–1.0)
Monocytes Relative: 5.6 % (ref 3.0–12.0)
NEUTROS ABS: 3.4 10*3/uL (ref 1.4–7.7)
NEUTROS PCT: 57.6 % (ref 43.0–77.0)
PLATELETS: 220 10*3/uL (ref 150.0–400.0)
RBC: 4.32 Mil/uL (ref 3.87–5.11)
RDW: 16 % — AB (ref 11.5–15.5)
WBC: 5.9 10*3/uL (ref 4.0–10.5)

## 2015-07-07 LAB — HEPATIC FUNCTION PANEL
ALT: 14 U/L (ref 0–35)
AST: 15 U/L (ref 0–37)
Albumin: 4.5 g/dL (ref 3.5–5.2)
Alkaline Phosphatase: 67 U/L (ref 39–117)
BILIRUBIN DIRECT: 0.1 mg/dL (ref 0.0–0.3)
BILIRUBIN TOTAL: 0.4 mg/dL (ref 0.2–1.2)
Total Protein: 6.9 g/dL (ref 6.0–8.3)

## 2015-07-07 LAB — LIPID PANEL
CHOLESTEROL: 176 mg/dL (ref 0–200)
HDL: 57.5 mg/dL (ref >39.00)
LDL Cholesterol: 89 mg/dL (ref 0–99)
NonHDL: 118.66
TRIGLYCERIDES: 147 mg/dL (ref 0.0–149.0)
Total CHOL/HDL Ratio: 3
VLDL: 29.4 mg/dL (ref 0.0–40.0)

## 2015-07-07 LAB — HEMOGLOBIN A1C: HEMOGLOBIN A1C: 6.5 % (ref 4.6–6.5)

## 2015-07-07 LAB — TSH: TSH: 1.83 u[IU]/mL (ref 0.35–4.50)

## 2015-07-07 LAB — VITAMIN D 25 HYDROXY (VIT D DEFICIENCY, FRACTURES): VITD: 46.66 ng/mL (ref 30.00–100.00)

## 2015-07-07 MED ORDER — SERTRALINE HCL 100 MG PO TABS: 200.0000 mg | ORAL_TABLET | Freq: Every day | ORAL | Status: DC

## 2015-07-07 NOTE — Patient Instructions (Addendum)
It was great seeing you again!  As we talked about   1. Increase Zoloft to '200mg'$   2. Cut Lisinopril in half to 10 mg. Monitor blood pressure and let me know if it is above 130/80 3. Stop taking Metformin and we will recheck you in 3 months. Watch your diet!   Call the breast center and schedule your mammogram and bone density.   Follow up with me in three months or sooner if needed.   Please let me know if you need anything.       Menopause is a normal process in which your reproductive ability comes to an end. This process happens gradually over a span of months to years, usually between the ages of 83 and 6. Menopause is complete when you have missed 12 consecutive menstrual periods. It is important to talk with your health care provider about some of the most common conditions that affect postmenopausal women, such as heart disease, cancer, and bone loss (osteoporosis). Adopting a healthy lifestyle and getting preventive care can help to promote your health and wellness. Those actions can also lower your chances of developing some of these common conditions. WHAT SHOULD I KNOW ABOUT MENOPAUSE? During menopause, you may experience a number of symptoms, such as:  Moderate-to-severe hot flashes.  Night sweats.  Decrease in sex drive.  Mood swings.  Headaches.  Tiredness.  Irritability.  Memory problems.  Insomnia. Choosing to treat or not to treat menopausal changes is an individual decision that you make with your health care provider. WHAT SHOULD I KNOW ABOUT HORMONE REPLACEMENT THERAPY AND SUPPLEMENTS? Hormone therapy products are effective for treating symptoms that are associated with menopause, such as hot flashes and night sweats. Hormone replacement carries certain risks, especially as you become older. If you are thinking about using estrogen or estrogen with progestin treatments, discuss the benefits and risks with your health care provider. WHAT SHOULD I KNOW  ABOUT HEART DISEASE AND STROKE? Heart disease, heart attack, and stroke become more likely as you age. This may be due, in part, to the hormonal changes that your body experiences during menopause. These can affect how your body processes dietary fats, triglycerides, and cholesterol. Heart attack and stroke are both medical emergencies. There are many things that you can do to help prevent heart disease and stroke:  Have your blood pressure checked at least every 1-2 years. High blood pressure causes heart disease and increases the risk of stroke.  If you are 71-66 years old, ask your health care provider if you should take aspirin to prevent a heart attack or a stroke.  Do not use any tobacco products, including cigarettes, chewing tobacco, or electronic cigarettes. If you need help quitting, ask your health care provider.  It is important to eat a healthy diet and maintain a healthy weight.  Be sure to include plenty of vegetables, fruits, low-fat dairy products, and lean protein.  Avoid eating foods that are high in solid fats, added sugars, or salt (sodium).  Get regular exercise. This is one of the most important things that you can do for your health.  Try to exercise for at least 150 minutes each week. The type of exercise that you do should increase your heart rate and make you sweat. This is known as moderate-intensity exercise.  Try to do strengthening exercises at least twice each week. Do these in addition to the moderate-intensity exercise.  Know your numbers.Ask your health care provider to check your cholesterol and your  blood glucose. Continue to have your blood tested as directed by your health care provider. WHAT SHOULD I KNOW ABOUT CANCER SCREENING? There are several types of cancer. Take the following steps to reduce your risk and to catch any cancer development as early as possible. Breast Cancer  Practice breast self-awareness.  This means understanding how your  breasts normally appear and feel.  It also means doing regular breast self-exams. Let your health care provider know about any changes, no matter how small.  If you are 45 or older, have a clinician do a breast exam (clinical breast exam or CBE) every year. Depending on your age, family history, and medical history, it may be recommended that you also have a yearly breast X-ray (mammogram).  If you have a family history of breast cancer, talk with your health care provider about genetic screening.  If you are at high risk for breast cancer, talk with your health care provider about having an MRI and a mammogram every year.  Breast cancer (BRCA) gene test is recommended for women who have family members with BRCA-related cancers. Results of the assessment will determine the need for genetic counseling and BRCA1 and for BRCA2 testing. BRCA-related cancers include these types:  Breast. This occurs in males or females.  Ovarian.  Tubal. This may also be called fallopian tube cancer.  Cancer of the abdominal or pelvic lining (peritoneal cancer).  Prostate.  Pancreatic. Cervical, Uterine, and Ovarian Cancer Your health care provider may recommend that you be screened regularly for cancer of the pelvic organs. These include your ovaries, uterus, and vagina. This screening involves a pelvic exam, which includes checking for microscopic changes to the surface of your cervix (Pap test).  For women ages 21-65, health care providers may recommend a pelvic exam and a Pap test every three years. For women ages 45-65, they may recommend the Pap test and pelvic exam, combined with testing for human papilloma virus (HPV), every five years. Some types of HPV increase your risk of cervical cancer. Testing for HPV may also be done on women of any age who have unclear Pap test results.  Other health care providers may not recommend any screening for nonpregnant women who are considered low risk for pelvic  cancer and have no symptoms. Ask your health care provider if a screening pelvic exam is right for you.  If you have had past treatment for cervical cancer or a condition that could lead to cancer, you need Pap tests and screening for cancer for at least 20 years after your treatment. If Pap tests have been discontinued for you, your risk factors (such as having a new sexual partner) need to be reassessed to determine if you should start having screenings again. Some women have medical problems that increase the chance of getting cervical cancer. In these cases, your health care provider may recommend that you have screening and Pap tests more often.  If you have a family history of uterine cancer or ovarian cancer, talk with your health care provider about genetic screening.  If you have vaginal bleeding after reaching menopause, tell your health care provider.  There are currently no reliable tests available to screen for ovarian cancer. Lung Cancer Lung cancer screening is recommended for adults 60-85 years old who are at high risk for lung cancer because of a history of smoking. A yearly low-dose CT scan of the lungs is recommended if you:  Currently smoke.  Have a history of at least 30  pack-years of smoking and you currently smoke or have quit within the past 15 years. A pack-year is smoking an average of one pack of cigarettes per day for one year. Yearly screening should:  Continue until it has been 15 years since you quit.  Stop if you develop a health problem that would prevent you from having lung cancer treatment. Colorectal Cancer  This type of cancer can be detected and can often be prevented.  Routine colorectal cancer screening usually begins at age 46 and continues through age 4.  If you have risk factors for colon cancer, your health care provider may recommend that you be screened at an earlier age.  If you have a family history of colorectal cancer, talk with your  health care provider about genetic screening.  Your health care provider may also recommend using home test kits to check for hidden blood in your stool.  A small camera at the end of a tube can be used to examine your colon directly (sigmoidoscopy or colonoscopy). This is done to check for the earliest forms of colorectal cancer.  Direct examination of the colon should be repeated every 5-10 years until age 81. However, if early forms of precancerous polyps or small growths are found or if you have a family history or genetic risk for colorectal cancer, you may need to be screened more often. Skin Cancer  Check your skin from head to toe regularly.  Monitor any moles. Be sure to tell your health care provider:  About any new moles or changes in moles, especially if there is a change in a mole's shape or color.  If you have a mole that is larger than the size of a pencil eraser.  If any of your family members has a history of skin cancer, especially at a young age, talk with your health care provider about genetic screening.  Always use sunscreen. Apply sunscreen liberally and repeatedly throughout the day.  Whenever you are outside, protect yourself by wearing long sleeves, pants, a wide-brimmed hat, and sunglasses. WHAT SHOULD I KNOW ABOUT OSTEOPOROSIS? Osteoporosis is a condition in which bone destruction happens more quickly than new bone creation. After menopause, you may be at an increased risk for osteoporosis. To help prevent osteoporosis or the bone fractures that can happen because of osteoporosis, the following is recommended:  If you are 18-11 years old, get at least 1,000 mg of calcium and at least 600 mg of vitamin D per day.  If you are older than age 19 but younger than age 52, get at least 1,200 mg of calcium and at least 600 mg of vitamin D per day.  If you are older than age 51, get at least 1,200 mg of calcium and at least 800 mg of vitamin D per day. Smoking and  excessive alcohol intake increase the risk of osteoporosis. Eat foods that are rich in calcium and vitamin D, and do weight-bearing exercises several times each week as directed by your health care provider. WHAT SHOULD I KNOW ABOUT HOW MENOPAUSE AFFECTS [place]? Depression may occur at any age, but it is more common as you become older. Common symptoms of depression include:  Low or sad mood.  Changes in sleep patterns.  Changes in appetite or eating patterns.  Feeling an overall lack of motivation or enjoyment of activities that you previously enjoyed.  Frequent crying spells. Talk with your health care provider if you think that you are experiencing depression. WHAT SHOULD  I KNOW ABOUT IMMUNIZATIONS? It is important that you get and maintain your immunizations. These include:  Tetanus, diphtheria, and pertussis (Tdap) booster vaccine.  Influenza every year before the flu season begins.  Pneumonia vaccine.  Shingles vaccine. Your health care provider may also recommend other immunizations.   This information is not intended to replace advice given to you by your health care provider. Make sure you discuss any questions you have with your health care provider.   Document Released: 04/28/2005 Document Revised: 03/27/2014 Document Reviewed: 11/06/2013 Elsevier Interactive Patient Education [employer].

## 2015-07-07 NOTE — Progress Notes (Signed)
Subjective:    Patient ID: Kara Huynh, female    DOB: 1945/12/16, 70 y.o.   MRN: [id number]  HPI Patient presents for yearly preventative medicine examination. She has  has a past medical history of Hyperlipidemia; Hypertension; Anxiety; Depression; Hypothyroidism; Type 2 diabetes mellitus ([place]); Arthritis; Endometrial polyp (02-17-13); and Cancer ([place]).  Medicare questionnaire was completed  All immunizations and health maintenance protocols were reviewed with the patient and needed orders were placed.  Appropriate screening laboratory values were ordered for the patient including screening of hyperlipidemia, renal function and hepatic function. If indicated by BPH, a PSA was ordered.  Medication reconciliation,  past medical history, social history, problem list and allergies were reviewed in detail with the patient  Goals were established with regard to weight loss, exercise, and  diet in compliance with medications  End of life planning was discussed.  She had mammogram in 08/2014 and colonoscopy in 2015  She denies any interval history .   Her husband is currently going through treatment for leukemia. She feels as though her anxiety is higher than it usually is. She is taking 150 mg Zoloft daily.   Review of Systems  Constitutional: Negative.   HENT: Negative.   Eyes: Negative.   Respiratory: Negative.   Cardiovascular: Negative.   Gastrointestinal: Negative.   Endocrine: Negative.   Genitourinary: Negative.   Musculoskeletal: Negative.   Skin: Negative.   Allergic/Immunologic: Negative.   Neurological: Negative.   Hematological: Negative.   All other systems reviewed and are negative.  Past Medical History  Diagnosis Date  . Hyperlipidemia   . Hypertension   . Anxiety   . Depression   . Hypothyroidism   . Type 2 diabetes mellitus ([place])   . Arthritis   . Endometrial polyp 02-17-13    11'14 Dx. endometrial cancer  . Cancer [facility])     uterine    Social  History   Social History  . Marital Status: Married    Spouse Name: N/A  . Number of Children: N/A  . Years of Education: N/A   Occupational History  . Not on file.   Social History Main Topics  . Smoking status: Former Smoker    Quit date: 07/04/2009  . Smokeless tobacco: Never Used  . Alcohol Use: Yes     Comment: twice monthly  . Drug Use: No  . Sexual Activity:    Partners: Male    Birth Control/ Protection: Post-menopausal   Other Topics Concern  . Not on file   Social History Narrative   Retired from being an Web designer   Married for 35 years   3 step children and 1 adopted child. They live all over the [name]    Past Surgical History  Procedure Laterality Date  . Total knee arthroplasty Bilateral   . Breast surgery      biopsy  . Laparoscopy for ectopic pregnancy  1977    SALPINGOSTOMY (SIDE UNKNOWN)  . Appendectomy    . Dilitation & currettage/hystroscopy with versapoint resection N/A 01/31/2013    Procedure: DILATATION & CURETTAGE/HYSTEROSCOPY WITH CERVICAL BLOCK;  Surgeon: Marylynn Pearson, MD;  Location: [facility];  Service: Gynecology;  Laterality: N/A;  . Cataract extraction, bilateral Bilateral 02-17-13    bilateral  . Laparotomy Bilateral 02/18/2013    Procedure: EXPLORATORY LAPAROTOMY TOTAL ABDMONIAL HYSTERECTOMY BILATERAL SALPINGO OOPHORECTOMY ;  Surgeon: Alvino Chapel, MD;  Location: [facility];  Service: Gynecology;  Laterality: Bilateral;  . Total abdominal hysterectomy w/ bilateral salpingoophorectomy  Family History  Problem Relation Age of Onset  . Arthritis Mother   . Mental illness Mother   . Diabetes Mother   . Cancer Father     colon, lung, and prostate  . Arthritis Father   . Hyperlipidemia Father   . Stroke Father   . Heart disease Father   . Hypertension Father   . Colitis Father   . Esophageal cancer Neg Hx   . Rectal cancer Neg Hx   . Stomach cancer Neg Hx     No Known Allergies  Current Outpatient  Prescriptions on File Prior to Visit  Medication Sig Dispense Refill  . atorvastatin (LIPITOR) 20 MG tablet TAKE 1 TABLET BY MOUTH EVERY DAY 90 tablet 1  . Cholecalciferol (VITAMIN D) 2000 UNITS CAPS Take 1 capsule by mouth daily.     . Cyanocobalamin 6000 MCG SUBL Place 1 tablet (6,000 mcg total) under the tongue daily. 90 tablet 1  . levothyroxine (SYNTHROID, LEVOTHROID) 25 MCG tablet TAKE 1 TABLET EVERY MORNING AS DIRECTED 90 tablet 1  . lisinopril (PRINIVIL,ZESTRIL) 20 MG tablet TAKE 1 TABLET (20 MG TOTAL) BY MOUTH DAILY. 90 tablet 0  . meloxicam (MOBIC) 15 MG tablet TAKE 1 TABLET BY MOUTH EVERY DAY 30 tablet 4  . metFORMIN (GLUCOPHAGE) 500 MG tablet TAKE 1 TABLET BY MOUTH TWICE A DAY WITH MEALS 180 tablet 1  . sertraline (ZOLOFT) 100 MG tablet TAKE 1.5 TABLETS (150 MG TOTAL) BY MOUTH DAILY. 135 tablet 1  . sertraline (ZOLOFT) 100 MG tablet TAKE 1.5 TABLETS (150 MG TOTAL) BY MOUTH DAILY. 135 tablet 1   No current facility-administered medications on file prior to visit.    BP 100/50 mmHg  Temp(Src) 98.1 F (36.7 C) (Oral)  Ht 5' 4.25" (1.632 m)  Wt 211 lb 8 oz (95.936 kg)  BMI 36.02 kg/m2       Objective:   Physical Exam  Constitutional: She is oriented to person, place, and time. She appears well-developed and well-nourished. No distress.  HENT:  Head: Normocephalic and atraumatic.  Right Ear: External ear normal.  Left Ear: External ear normal.  Nose: Nose normal.  Mouth/Throat: Oropharynx is clear and moist. No oropharyngeal exudate.  Eyes: Conjunctivae and EOM are normal. Pupils are equal, round, and reactive to light. Right eye exhibits no discharge. Left eye exhibits no discharge. No scleral icterus.  Neck: Normal range of motion. Neck supple. No JVD present. No tracheal deviation present. No thyromegaly present.  Cardiovascular: Normal rate, regular rhythm, normal heart sounds and intact distal pulses.  Exam reveals no gallop and no friction rub.   No murmur  heard. Pulmonary/Chest: Effort normal and breath sounds normal. No stridor. No respiratory distress. She has no wheezes. She has no rales. She exhibits no tenderness.  Abdominal: Soft. Bowel sounds are normal. She exhibits no distension and no mass. There is no tenderness. There is no rebound and no guarding.  Musculoskeletal: Normal range of motion. She exhibits no edema or tenderness.  Lymphadenopathy:    She has no cervical adenopathy.  Neurological: She is alert and oriented to person, place, and time.  Skin: Skin is warm and dry. No rash noted. She is not diaphoretic. No erythema. No pallor.  Psychiatric: She has a normal mood and affect. Her behavior is normal. Thought content normal.  Nursing note and vitals reviewed.      Assessment & Plan:  1. Healthcare maintenance - EKG 12-Lead- NSR, rate 69 - POCT Urinalysis Dipstick (Automated) - Basic metabolic  panel - CBC with Differential/Platelet - Hemoglobin A1c - Hepatic function panel - Lipid panel - TSH - Vitamin D, 25-hydroxy - DG Bone Density; Future  2. Essential hypertension - Her BP is low, I am going to have her take 10mg  lisinopril instead of 20 mg. Follow up with me if BP goes above [id number] systolic.  - EKG [id number] - POCT Urinalysis Dipstick (Automated) - Basic metabolic panel - CBC with Differential/Platelet - Hemoglobin A1c - Hepatic function panel - Lipid panel - TSH - Vitamin D, 25-hydroxy  3. Controlled type 2 diabetes mellitus without complication, with long-term current use of insulin ([place]) - She is well controlled and I have never seen an elevated A1c in her chart. I am going to take her off Metformin for 3 months and retest. She may have been started on Metformin duye to being pre diabetic  - She was advised to watch her diet.  - EKG [id number] - Basic metabolic panel - CBC with Differential/Platelet - Hemoglobin A1c  4. Depression  - sertraline (ZOLOFT) 100 MG tablet; Take 2 tablets (200 mg total) by  mouth daily.  Dispense: 180 tablet; Refill: 3  5. Vitamin D deficiency  - Vitamin D, 25-hydroxy - DG Bone Density; Future  6. Other specified hypothyroidism  - EKG [id number] - Basic metabolic panel - CBC with Differential/Platelet - Hepatic function panel - TSH  Dorothyann Peng, NP

## 2015-07-08 LAB — HEPATITIS C ANTIBODY: HCV Ab: NEGATIVE

## 2015-07-14 ENCOUNTER — Other Ambulatory Visit: Payer: Self-pay | Admitting: Adult Health

## 2015-07-14 DIAGNOSIS — Z1231 Encounter for screening mammogram for malignant neoplasm of breast: Secondary | ICD-10-CM

## 2015-07-26 DIAGNOSIS — E119 Type 2 diabetes mellitus without complications: Secondary | ICD-10-CM | POA: Diagnosis not present

## 2015-07-26 DIAGNOSIS — N183 Chronic kidney disease, stage 3 (moderate): Secondary | ICD-10-CM | POA: Diagnosis not present

## 2015-07-26 DIAGNOSIS — I10 Essential (primary) hypertension: Secondary | ICD-10-CM | POA: Diagnosis not present

## 2015-07-28 ENCOUNTER — Telehealth: Payer: Self-pay | Admitting: Adult Health

## 2015-07-28 NOTE — Telephone Encounter (Signed)
Pt would like to know which one she is not to take Meloxicam or Metformin.

## 2015-07-28 NOTE — Telephone Encounter (Signed)
Per Kara Huynh - patient is to take Metformin, and try to not take Meloxicam as much as possible. Patient notified & verbalized understanding.

## 2015-08-09 ENCOUNTER — Other Ambulatory Visit: Payer: Self-pay

## 2015-08-09 MED ORDER — ATORVASTATIN CALCIUM 20 MG PO TABS: 20.0000 mg | ORAL_TABLET | Freq: Every day | ORAL | Status: DC

## 2015-09-08 ENCOUNTER — Ambulatory Visit
Admission: RE | Admit: 2015-09-08 | Discharge: 2015-09-08 | Disposition: A | Discharge status: Home / Self Care | Payer: Commercial Managed Care - HMO | Source: Ambulatory Visit | Attending: Adult Health | Admitting: Adult Health

## 2015-09-08 DIAGNOSIS — Z780 Asymptomatic menopausal state: Secondary | ICD-10-CM | POA: Diagnosis not present

## 2015-09-08 DIAGNOSIS — Z13820 Encounter for screening for osteoporosis: Secondary | ICD-10-CM | POA: Diagnosis not present

## 2015-09-08 DIAGNOSIS — Z0000 Encounter for general adult medical examination without abnormal findings: Secondary | ICD-10-CM

## 2015-09-08 DIAGNOSIS — Z1231 Encounter for screening mammogram for malignant neoplasm of breast: Secondary | ICD-10-CM | POA: Diagnosis not present

## 2015-09-08 DIAGNOSIS — E559 Vitamin D deficiency, unspecified: Secondary | ICD-10-CM

## 2015-09-21 ENCOUNTER — Other Ambulatory Visit: Payer: Self-pay | Admitting: Family Medicine

## 2015-09-22 ENCOUNTER — Other Ambulatory Visit: Payer: Self-pay | Admitting: Adult Health

## 2015-09-22 MED ORDER — LISINOPRIL 10 MG PO TABS: 10.0000 mg | ORAL_TABLET | Freq: Every day | ORAL | Status: DC

## 2015-09-22 NOTE — Telephone Encounter (Signed)
See below

## 2015-10-06 ENCOUNTER — Ambulatory Visit (INDEPENDENT_AMBULATORY_CARE_PROVIDER_SITE_OTHER): Payer: Commercial Managed Care - HMO | Admitting: Adult Health

## 2015-10-06 ENCOUNTER — Encounter: Payer: Self-pay | Admitting: Adult Health

## 2015-10-06 VITALS — BP 106/72 | Temp 97.9°F | Ht 64.25 in | Wt 190.5 lb

## 2015-10-06 DIAGNOSIS — E119 Type 2 diabetes mellitus without complications: Secondary | ICD-10-CM

## 2015-10-06 DIAGNOSIS — I10 Essential (primary) hypertension: Secondary | ICD-10-CM

## 2015-10-06 DIAGNOSIS — Z794 Long term (current) use of insulin: Secondary | ICD-10-CM | POA: Diagnosis not present

## 2015-10-06 LAB — POCT GLYCOSYLATED HEMOGLOBIN (HGB A1C): HEMOGLOBIN A1C: 5.8

## 2015-10-06 NOTE — Patient Instructions (Signed)
It was great seeing you again!  You are doing an amazing job! Keep up the good work   Your A1c was 5.8, this is a drop from 6.5, three months ago.   - Stop taking the Metformin   Your blood pressure is great as well. Stop taking the lisinopril. Continue to monitor blood pressure at home. If it is up to 130's-140's then restart the Lisinopril, you can take 5 mg to see how you respond to that  Follow up in three months   GREAT JOB!!

## 2015-10-06 NOTE — Progress Notes (Signed)
Subjective:    Patient ID: Kara Huynh, female    DOB: 02/11/1946, 70 y.o.   MRN: [id number]  HPI  70 year old female who presents to the office for three month follow up regarding hypertension and diabetes.   During her last visit she was taken off Metformin due to having well controlled blood sugars. Her lisinopril was also decreased from 20 mg to 10 mg.   Today in the office she reports that she did not quit taking her Metformin because she forgot.   She has  is exercising and has cut out sugar.  She is taking lisinopril 10 mg.   BP Readings from Last 3 Encounters:  10/06/15 106/72  07/07/15 100/50  02/05/15 115/56   Wt Readings from Last 3 Encounters:  10/06/15 190 lb 8 oz (86.41 kg)  07/07/15 211 lb 8 oz (95.936 kg)  02/05/15 211 lb (95.709 kg)     Review of Systems  Constitutional: Negative.   HENT: Negative.   Respiratory: Negative.   Cardiovascular: Negative.   Genitourinary: Negative.   Musculoskeletal: Negative.   Neurological: Negative.   All other systems reviewed and are negative.  Past Medical History  Diagnosis Date  . Hyperlipidemia   . Hypertension   . Anxiety   . Depression   . Hypothyroidism   . Type 2 diabetes mellitus ([place])   . Arthritis   . Endometrial polyp 02-17-13    11'14 Dx. endometrial cancer  . Cancer [facility])     uterine    Social History   Social History  . Marital Status: Married    Spouse Name: N/A  . Number of Children: N/A  . Years of Education: N/A   Occupational History  . Not on file.   Social History Main Topics  . Smoking status: Former Smoker    Quit date: 07/04/2009  . Smokeless tobacco: Never Used  . Alcohol Use: Yes     Comment: twice monthly  . Drug Use: No  . Sexual Activity:    Partners: Male    Birth Control/ Protection: Post-menopausal   Other Topics Concern  . Not on file   Social History Narrative   Retired from being an Web designer   Married for 35 years   3 step children  and 1 adopted child. They live all over the [name]    Past Surgical History  Procedure Laterality Date  . Total knee arthroplasty Bilateral   . Breast surgery      biopsy  . Laparoscopy for ectopic pregnancy  1977    SALPINGOSTOMY (SIDE UNKNOWN)  . Appendectomy    . Dilitation & currettage/hystroscopy with versapoint resection N/A 01/31/2013    Procedure: DILATATION & CURETTAGE/HYSTEROSCOPY WITH CERVICAL BLOCK;  Surgeon: Marylynn Pearson, MD;  Location: [facility];  Service: Gynecology;  Laterality: N/A;  . Cataract extraction, bilateral Bilateral 02-17-13    bilateral  . Laparotomy Bilateral 02/18/2013    Procedure: EXPLORATORY LAPAROTOMY TOTAL ABDMONIAL HYSTERECTOMY BILATERAL SALPINGO OOPHORECTOMY ;  Surgeon: Alvino Chapel, MD;  Location: [facility];  Service: Gynecology;  Laterality: Bilateral;  . Total abdominal hysterectomy w/ bilateral salpingoophorectomy      Family History  Problem Relation Age of Onset  . Arthritis Mother   . Mental illness Mother   . Diabetes Mother   . Cancer Father     colon, lung, and prostate  . Arthritis Father   . Hyperlipidemia Father   . Stroke Father   . Heart disease Father   .  Hypertension Father   . Colitis Father   . Esophageal cancer Neg Hx   . Rectal cancer Neg Hx   . Stomach cancer Neg Hx     No Known Allergies  Current Outpatient Prescriptions on File Prior to Visit  Medication Sig Dispense Refill  . atorvastatin (LIPITOR) 20 MG tablet Take 1 tablet (20 mg total) by mouth daily. 90 tablet 2  . Cholecalciferol (VITAMIN D) 2000 UNITS CAPS Take 1 capsule by mouth daily.     . Cyanocobalamin 6000 MCG SUBL Place 1 tablet (6,000 mcg total) under the tongue daily. 90 tablet 1  . levothyroxine (SYNTHROID, LEVOTHROID) 25 MCG tablet TAKE 1 TABLET EVERY MORNING AS DIRECTED 90 tablet 1  . lisinopril (PRINIVIL,ZESTRIL) 10 MG tablet Take 1 tablet (10 mg total) by mouth daily. 90 tablet 3  . meloxicam (MOBIC) 15 MG tablet TAKE 1 TABLET BY MOUTH  EVERY DAY 30 tablet 4  . metFORMIN (GLUCOPHAGE) 500 MG tablet TAKE 1 TABLET BY MOUTH TWICE A DAY WITH MEALS 180 tablet 1  . sertraline (ZOLOFT) 100 MG tablet Take 2 tablets (200 mg total) by mouth daily. 180 tablet 3   No current facility-administered medications on file prior to visit.    Temp(Src) 97.9 F (36.6 C) (Oral)  Ht 5' 4.25" (1.632 m)  Wt 190 lb 8 oz (86.41 kg)  BMI 32.44 kg/m2       Objective:   Physical Exam  Constitutional: She is oriented to person, place, and time. She appears well-developed and well-nourished. No distress.  Cardiovascular: Normal rate, regular rhythm, normal heart sounds and intact distal pulses.  Exam reveals no gallop and no friction rub.   No murmur heard. Pulmonary/Chest: Effort normal and breath sounds normal. No respiratory distress. She has no wheezes. She has no rales. She exhibits no tenderness.  Neurological: She is alert and oriented to person, place, and time.  Skin: Skin is warm and dry. No rash noted. She is not diaphoretic. No erythema. No pallor.  Psychiatric: She has a normal mood and affect. Her behavior is normal. Judgment and thought content normal.  Nursing note and vitals reviewed.     Assessment & Plan:  1. Controlled type 2 diabetes mellitus without complication, with long-term current use of insulin (HCC) - POC HgB A1c 5.8 - Stop metformin  - Monitor blood sugars at home - Follow up in three months  2. Essential hypertension - Has lost significant weight - Stop lisinopril 10 mg.  - Monitor BP at home. She can restart lisinopril if her BP is 130-140's consistently.  - Continue to diet and exercise.   Dorothyann Peng, NP

## 2015-12-02 ENCOUNTER — Telehealth: Payer: Self-pay | Admitting: Adult Health

## 2015-12-02 NOTE — Telephone Encounter (Signed)
See below

## 2015-12-02 NOTE — Telephone Encounter (Signed)
Pt would like to speak with Tommi Rumps concerning her husband passing, her taking the shingles and flu shot and other personal things.  I made patient aware that she may want to check with her insurance company to see if they will pay due to the cost per Butch Penny.

## 2015-12-02 NOTE — Telephone Encounter (Signed)
I will be back in the office on Tuesday and would be happy to speak to her about this then

## 2015-12-03 ENCOUNTER — Ambulatory Visit (INDEPENDENT_AMBULATORY_CARE_PROVIDER_SITE_OTHER): Payer: Commercial Managed Care - HMO | Admitting: *Deleted

## 2015-12-03 DIAGNOSIS — Z23 Encounter for immunization: Secondary | ICD-10-CM | POA: Diagnosis not present

## 2015-12-04 ENCOUNTER — Other Ambulatory Visit: Payer: Self-pay | Admitting: Family Medicine

## 2016-01-07 ENCOUNTER — Other Ambulatory Visit: Payer: Self-pay | Admitting: Adult Health

## 2016-01-07 ENCOUNTER — Telehealth: Payer: Self-pay | Admitting: Adult Health

## 2016-01-07 MED ORDER — ALPRAZOLAM 0.25 MG PO TABS: 0.2500 mg | ORAL_TABLET | Freq: Two times a day (BID) | ORAL | 0 refills | Status: DC | PRN

## 2016-01-07 NOTE — Telephone Encounter (Signed)
Patient notified. Patient verbalized understanding.  Patient states that she just does not feel like seeing anyone right now, because she cries a lot & her nerves are out of control - and states she is just not quite ready for an appointment yet. Patient states that if is there is anything that can be sent in without an appointment that would help her nerves - that it would be so great. But if not, she understands.

## 2016-01-07 NOTE — Telephone Encounter (Signed)
Please advise 

## 2016-01-07 NOTE — Telephone Encounter (Signed)
Pt state that her husband has recently passed away two weeks ago and would like to have a Rx for ritalin   Pharm:  [facility].  Pt just do not feel like coming out of the house for anything.

## 2016-01-07 NOTE — Telephone Encounter (Signed)
Per Tommi Rumps, ok to call in Xanax.  Patient notified and verbalized understanding

## 2016-01-07 NOTE — Telephone Encounter (Signed)
I am so sorry for her loss. I unfortunately cannot prescribe that medication. That needs to be done by a specialist. I would be happy to see her if she feels like she is depressed

## 2016-02-08 ENCOUNTER — Other Ambulatory Visit: Payer: Self-pay | Admitting: Adult Health

## 2016-02-09 NOTE — Telephone Encounter (Signed)
Ok to refill this for one additional time. Any other refills she will need to see psychiatry

## 2016-02-09 NOTE — Telephone Encounter (Signed)
Ok to refill this medication? 

## 2016-02-09 NOTE — Telephone Encounter (Signed)
Rx refilled. Patient notified.  

## 2016-03-16 ENCOUNTER — Other Ambulatory Visit: Payer: Self-pay | Admitting: Adult Health

## 2016-03-16 ENCOUNTER — Telehealth: Payer: Self-pay | Admitting: Adult Health

## 2016-03-16 NOTE — Telephone Encounter (Signed)
Pt need new Rx for alprazolam and would like to know if you would consider upper her dosage due to her being very anxious after losing her husband.

## 2016-03-16 NOTE — Telephone Encounter (Signed)
She can take it three times per day. I would like her to come in to make sure we are managing this correctly.

## 2016-03-16 NOTE — Telephone Encounter (Signed)
Rx has been called in. Patient notified and verbalized understanding.  Patient states she will schedule an appt as soon as possible once the holidays are over.

## 2016-03-16 NOTE — Telephone Encounter (Signed)
Please advise 

## 2016-05-26 ENCOUNTER — Telehealth: Payer: Self-pay | Admitting: Adult Health

## 2016-05-26 NOTE — Telephone Encounter (Signed)
Pt request refill   ALPRAZolam (XANAX) 0.25 MG tablet  [id number] [place] [place] - [place]

## 2016-05-26 NOTE — Telephone Encounter (Signed)
Per Tommi Rumps, we need to get patient scheduled before this can medication can be refilled. Would you mind calling and scheduling appt? 30 min please. Thanks!

## 2016-05-30 ENCOUNTER — Telehealth: Payer: Self-pay | Admitting: Adult Health

## 2016-05-30 NOTE — Telephone Encounter (Signed)
Duplicate message. 

## 2016-05-30 NOTE — Telephone Encounter (Signed)
Pt has been scheduled   Pt was very upset and crying that she has to come out of the house.

## 2016-05-30 NOTE — Telephone Encounter (Signed)
Will route to Cory as FYI. 

## 2016-05-30 NOTE — Telephone Encounter (Signed)
Noted  

## 2016-05-30 NOTE — Telephone Encounter (Signed)
LMTCB on VM. Also sent pt mychart message.

## 2016-05-30 NOTE — Telephone Encounter (Signed)
Pt need new Rx for alprazolam   Pharm:  [facility]

## 2016-05-31 ENCOUNTER — Other Ambulatory Visit: Payer: Self-pay | Admitting: Adult Health

## 2016-05-31 ENCOUNTER — Encounter: Payer: Self-pay | Admitting: Adult Health

## 2016-05-31 ENCOUNTER — Ambulatory Visit (INDEPENDENT_AMBULATORY_CARE_PROVIDER_SITE_OTHER): Payer: Medicare HMO | Admitting: Adult Health

## 2016-05-31 DIAGNOSIS — F332 Major depressive disorder, recurrent severe without psychotic features: Secondary | ICD-10-CM

## 2016-05-31 MED ORDER — ALPRAZOLAM 0.25 MG PO TABS: 0.2500 mg | ORAL_TABLET | Freq: Two times a day (BID) | ORAL | 0 refills | Status: DC | PRN

## 2016-05-31 MED ORDER — SERTRALINE HCL 100 MG PO TABS: 200.0000 mg | ORAL_TABLET | Freq: Every day | ORAL | 1 refills | Status: DC

## 2016-05-31 NOTE — Progress Notes (Signed)
Subjective:    Patient ID: Kara Huynh, female    DOB: August 30, 1945, 71 y.o.   MRN: [id number]  HPI  71 year old female who presents to the office today for follow up regarding anxiety and depression. This has been an ongoing issue for the patient since her husband passed last fall. She has found it hard to get out of the house and she has periods where she cries " the entire day".   She has a new grand daughter but she has not been to visit her yet in [place] because she is scared to go without her husband by her side.   She denies any suicidal ideation.   Review of Systems See HPI   Past Medical History:  Diagnosis Date  . Anxiety   . Arthritis   . Cancer ([place])    uterine  . Depression   . Endometrial polyp 02-17-13   11'14 Dx. endometrial cancer  . Hyperlipidemia   . Hypertension   . Hypothyroidism   . Type 2 diabetes mellitus ([place])     Social History   Social History  . Marital status: Widowed    Spouse name: N/A  . Number of children: N/A  . Years of education: N/A   Occupational History  . Not on file.   Social History Main Topics  . Smoking status: Former Smoker    Quit date: 07/04/2009  . Smokeless tobacco: Never Used  . Alcohol use Yes     Comment: twice monthly  . Drug use: No  . Sexual activity: Yes    Partners: Male    Birth control/ protection: Post-menopausal   Other Topics Concern  . Not on file   Social History Narrative   Retired from being an Web designer   Married for 35 years   3 step children and 1 adopted child. They live all over the [name]    Past Surgical History:  Procedure Laterality Date  . APPENDECTOMY    . BREAST SURGERY     biopsy  . CATARACT EXTRACTION, BILATERAL Bilateral 02-17-13   bilateral  . DILITATION & CURRETTAGE/HYSTROSCOPY WITH VERSAPOINT RESECTION N/A 01/31/2013   Procedure: DILATATION & CURETTAGE/HYSTEROSCOPY WITH CERVICAL BLOCK;  Surgeon: Marylynn Pearson, MD;  Location: [facility];  Service:  Gynecology;  Laterality: N/A;  . LAPAROSCOPY FOR ECTOPIC PREGNANCY  1977   SALPINGOSTOMY (SIDE UNKNOWN)  . LAPAROTOMY Bilateral 02/18/2013   Procedure: EXPLORATORY LAPAROTOMY TOTAL ABDMONIAL HYSTERECTOMY BILATERAL SALPINGO OOPHORECTOMY ;  Surgeon: Alvino Chapel, MD;  Location: [facility];  Service: Gynecology;  Laterality: Bilateral;  . TOTAL ABDOMINAL HYSTERECTOMY W/ BILATERAL SALPINGOOPHORECTOMY    . TOTAL KNEE ARTHROPLASTY Bilateral     Family History  Problem Relation Age of Onset  . Arthritis Mother   . Mental illness Mother   . Diabetes Mother   . Cancer Father     colon, lung, and prostate  . Arthritis Father   . Hyperlipidemia Father   . Stroke Father   . Heart disease Father   . Hypertension Father   . Colitis Father   . Esophageal cancer Neg Hx   . Rectal cancer Neg Hx   . Stomach cancer Neg Hx     No Known Allergies  Current Outpatient Prescriptions on File Prior to Visit  Medication Sig Dispense Refill  . Cholecalciferol (VITAMIN D) 2000 UNITS CAPS Take 1 capsule by mouth daily.     . Cyanocobalamin 6000 MCG SUBL Place 1 tablet (6,000 mcg total) under the  tongue daily. 90 tablet 1  . levothyroxine (SYNTHROID, LEVOTHROID) 25 MCG tablet TAKE 1 TABLET EVERY MORNING AS DIRECTED 90 tablet 1  . lisinopril (PRINIVIL,ZESTRIL) 10 MG tablet Take 1 tablet (10 mg total) by mouth daily. 90 tablet 3  . meloxicam (MOBIC) 15 MG tablet TAKE 1 TABLET BY MOUTH EVERY DAY 30 tablet 4  . metFORMIN (GLUCOPHAGE) 500 MG tablet TAKE 1 TABLET BY MOUTH TWICE A DAY WITH MEALS 180 tablet 1  . atorvastatin (LIPITOR) 20 MG tablet Take 1 tablet (20 mg total) by mouth daily. (Patient not taking: Reported on 05/31/2016) 90 tablet 2   No current facility-administered medications on file prior to visit.     BP 124/70   Temp 98.3 F (36.8 C) (Oral)   Ht 5' 4.25" (1.632 m)   Wt 206 lb 3.2 oz (93.5 kg)   BMI 35.12 kg/m       Objective:   Physical Exam  Constitutional: She is oriented to  person, place, and time. She appears well-developed and well-nourished. No distress.  Cardiovascular: Normal rate, regular rhythm, normal heart sounds and intact distal pulses.  Exam reveals no gallop and no friction rub.   No murmur heard. Pulmonary/Chest: Effort normal and breath sounds normal. No respiratory distress. She has no wheezes. She has no rales. She exhibits no tenderness.  Neurological: She is alert and oriented to person, place, and time.  Skin: Skin is warm and dry. No rash noted. She is not diaphoretic. No erythema. No pallor.  Psychiatric: She has a normal mood and affect. Her behavior is normal. Judgment and thought content normal.  Tearful throughout exam   Nursing note and vitals reviewed.     Assessment & Plan:  1. Severe episode of recurrent major depressive disorder, without psychotic features ([place]) - I would like her to see psychiatry and a therapist. Lists of local therapists and psychiatrists were given to her.  - sertraline (ZOLOFT) 100 MG tablet; Take 2 tablets (200 mg total) by mouth daily.  Dispense: 180 tablet; Refill: 1 - ALPRAZolam (XANAX) 0.25 MG tablet; Take 1 tablet (0.25 mg total) by mouth 2 (two) times daily as needed.  Dispense: 90 tablet; Refill: 0 - Advised to get on the plane to see her granddaughter and to get out of the house more often   Dorothyann Peng, NP

## 2016-05-31 NOTE — Telephone Encounter (Signed)
Patient has appt today with Tommi Rumps.  Per Tommi Rumps, will hold refill until patient is seen today.

## 2016-05-31 NOTE — Patient Instructions (Signed)
Thank you for coming in today.   I would like you to follow up with psychiatry   Please get down to [facility] to see your granddaughter!

## 2016-06-20 ENCOUNTER — Other Ambulatory Visit: Payer: Self-pay | Admitting: Adult Health

## 2016-06-20 DIAGNOSIS — F329 Major depressive disorder, single episode, unspecified: Secondary | ICD-10-CM

## 2016-06-20 DIAGNOSIS — F32A Depression, unspecified: Secondary | ICD-10-CM

## 2016-08-04 ENCOUNTER — Other Ambulatory Visit: Payer: Self-pay | Admitting: Adult Health

## 2016-08-04 DIAGNOSIS — Z1231 Encounter for screening mammogram for malignant neoplasm of breast: Secondary | ICD-10-CM

## 2016-08-29 NOTE — Progress Notes (Deleted)
Subjective:   Kara Huynh is a 71 y.o. female who presents for Medicare Annual (Subsequent) preventive examination.  Review of Systems:  No ROS.  Medicare Wellness Visit. Additional risk factors are reflected in the social history.     Sleep patterns: {SX; SLEEP PATTERNS:18802::"feels rested on waking","does not get up to void","gets up *** times nightly to void","sleeps *** hours nightly"}.   Home Safety/Smoke Alarms: Feels safe in home. Smoke alarms in place.  Living environment; residence and Firearm Safety: {Rehab home environment / accessibility:30080::"no firearms","firearms stored safely"}. Seat Belt Safety/Bike Helmet: Wears seat belt.   Counseling:   Eye Exam-  Dental-  Female:   Pap- Aged out.     Mammo- last 09/08/15. BI-RADS CATEGORY  1: Negative.  Dexa scan- last 09/08/15. Normal.         CCS- last 07/04/13. Severe diverticulosis, 1 polyp removed. 5 year recall.      Objective:     Vitals: There were no vitals taken for this visit.  There is no height or weight on file to calculate BMI.   Tobacco History  Smoking Status  . Former Smoker  . Quit date: 07/04/2009  Smokeless Tobacco  . Never Used     Counseling given: Not Answered   Past Medical History:  Diagnosis Date  . Anxiety   . Arthritis   . Cancer ([place])    uterine  . Depression   . Endometrial polyp 02-17-13   11'14 Dx. endometrial cancer  . Hyperlipidemia   . Hypertension   . Hypothyroidism   . Type 2 diabetes mellitus ([place])    Past Surgical History:  Procedure Laterality Date  . APPENDECTOMY    . BREAST SURGERY     biopsy  . CATARACT EXTRACTION, BILATERAL Bilateral 02-17-13   bilateral  . DILITATION & CURRETTAGE/HYSTROSCOPY WITH VERSAPOINT RESECTION N/A 01/31/2013   Procedure: DILATATION & CURETTAGE/HYSTEROSCOPY WITH CERVICAL BLOCK;  Surgeon: Marylynn Pearson, MD;  Location: [facility];  Service: Gynecology;  Laterality: N/A;  . LAPAROSCOPY FOR ECTOPIC PREGNANCY  1977   SALPINGOSTOMY  (SIDE UNKNOWN)  . LAPAROTOMY Bilateral 02/18/2013   Procedure: EXPLORATORY LAPAROTOMY TOTAL ABDMONIAL HYSTERECTOMY BILATERAL SALPINGO OOPHORECTOMY ;  Surgeon: Alvino Chapel, MD;  Location: [facility];  Service: Gynecology;  Laterality: Bilateral;  . TOTAL ABDOMINAL HYSTERECTOMY W/ BILATERAL SALPINGOOPHORECTOMY    . TOTAL KNEE ARTHROPLASTY Bilateral    Family History  Problem Relation Age of Onset  . Arthritis Mother   . Mental illness Mother   . Diabetes Mother   . Cancer Father        colon, lung, and prostate  . Arthritis Father   . Hyperlipidemia Father   . Stroke Father   . Heart disease Father   . Hypertension Father   . Colitis Father   . Esophageal cancer Neg Hx   . Rectal cancer Neg Hx   . Stomach cancer Neg Hx    History  Sexual Activity  . Sexual activity: Yes  . Partners: Male  . Birth control/ protection: Post-menopausal    Outpatient Encounter Prescriptions as of 08/30/2016  Medication Sig  . ALPRAZolam (XANAX) 0.25 MG tablet Take 1 tablet (0.25 mg total) by mouth 2 (two) times daily as needed.  Marland Kitchen atorvastatin (LIPITOR) 20 MG tablet TAKE 1 TABLET (20 MG TOTAL) BY MOUTH DAILY.  Marland Kitchen Cholecalciferol (VITAMIN D) 2000 UNITS CAPS Take 1 capsule by mouth daily.   . Cyanocobalamin 6000 MCG SUBL Place 1 tablet (6,000 mcg total) under the tongue daily.  Marland Kitchen  levothyroxine (SYNTHROID, LEVOTHROID) 25 MCG tablet TAKE 1 TABLET EVERY MORNING AS DIRECTED  . lisinopril (PRINIVIL,ZESTRIL) 10 MG tablet Take 1 tablet (10 mg total) by mouth daily.  . meloxicam (MOBIC) 15 MG tablet TAKE 1 TABLET BY MOUTH EVERY DAY  . metFORMIN (GLUCOPHAGE) 500 MG tablet TAKE 1 TABLET BY MOUTH TWICE A DAY WITH MEALS  . sertraline (ZOLOFT) 100 MG tablet Take 2 tablets (200 mg total) by mouth daily.   No facility-administered encounter medications on file as of 08/30/2016.     Activities of Daily Living No flowsheet data found.  Patient Care Team: Dorothyann Peng, NP as PCP - General (Family  Medicine)    Assessment:    Physical assessment deferred to PCP.  Exercise Activities and Dietary recommendations    Diet (meal preparation, eat out, water intake, caffeinated beverages, dairy products, fruits and vegetables): {Desc; diets:16563} Breakfast: Lunch:  Dinner:      Goals    None     Fall Risk Fall Risk  07/07/2015 05/01/2014  Falls in the past year? No No   Depression Screen PHQ 2/9 Scores 07/07/2015 05/01/2014  PHQ - 2 Score 0 0     Cognitive Function        Immunization History  Administered Date(s) Administered  . Pneumococcal Conjugate-13 10/07/2014  . Pneumococcal Polysaccharide-23 08/01/2012  . Tetanus 08/01/2012  . Zoster 12/03/2015   Screening Tests Health Maintenance  Topic Date Due  . OPHTHALMOLOGY EXAM  07/29/2015  . HEMOGLOBIN A1C  04/07/2016  . FOOT EXAM  07/06/2016  . INFLUENZA VACCINE  10/18/2016  . MAMMOGRAM  09/07/2017  . COLONOSCOPY  07/05/2018  . TETANUS/TDAP  08/02/2022  . DEXA SCAN  Completed  . Hepatitis C Screening  Completed  . PNA vac Low Risk Adult  Completed      Plan:   Follow-up w/ PCP as scheduled.   I have personally reviewed and noted the following in the patient's chart:   . Medical and social history . Use of alcohol, tobacco or illicit drugs  . Current medications and supplements . Functional ability and status . Nutritional status . Physical activity . Advanced directives . List of other physicians . Vitals . Screenings to include cognitive, depression, and falls . Referrals and appointments  In addition, I have reviewed and discussed with patient certain preventive protocols, quality metrics, and best practice recommendations. A written personalized care plan for preventive services as well as general preventive health recommendations were provided to patient.     Dorrene German, RN  08/29/2016

## 2016-08-30 ENCOUNTER — Ambulatory Visit: Payer: Medicare HMO

## 2016-09-08 ENCOUNTER — Ambulatory Visit: Payer: Medicare HMO

## 2016-09-16 ENCOUNTER — Other Ambulatory Visit: Payer: Self-pay | Admitting: Adult Health

## 2016-09-26 ENCOUNTER — Ambulatory Visit: Payer: Medicare HMO

## 2016-10-24 ENCOUNTER — Ambulatory Visit: Payer: Medicare HMO

## 2016-11-27 ENCOUNTER — Other Ambulatory Visit: Payer: Self-pay | Admitting: Adult Health

## 2016-12-06 ENCOUNTER — Other Ambulatory Visit: Payer: Self-pay | Admitting: Adult Health

## 2016-12-06 DIAGNOSIS — F332 Major depressive disorder, recurrent severe without psychotic features: Secondary | ICD-10-CM

## 2016-12-06 NOTE — Telephone Encounter (Signed)
Sent to the pharmacy by e-scribe. 

## 2016-12-06 NOTE — Telephone Encounter (Signed)
Ok to refill for 6 months 

## 2016-12-07 ENCOUNTER — Ambulatory Visit: Payer: Medicare HMO

## 2016-12-08 ENCOUNTER — Encounter: Payer: Self-pay | Admitting: Adult Health

## 2017-03-06 ENCOUNTER — Other Ambulatory Visit: Payer: Self-pay | Admitting: Adult Health

## 2017-03-06 NOTE — Telephone Encounter (Signed)
DENIED.  PT NEEDS AN APPT FOR FURTHER REFILLS OF THIS MEDICATION.

## 2017-03-07 ENCOUNTER — Ambulatory Visit: Payer: Medicare HMO | Admitting: Adult Health

## 2017-03-15 ENCOUNTER — Ambulatory Visit: Payer: Medicare HMO | Admitting: Adult Health

## 2017-03-15 ENCOUNTER — Encounter: Payer: Self-pay | Admitting: Adult Health

## 2017-03-15 VITALS — BP 110/62 | Temp 98.0°F | Ht 64.25 in | Wt 204.8 lb

## 2017-03-15 DIAGNOSIS — E039 Hypothyroidism, unspecified: Secondary | ICD-10-CM | POA: Diagnosis not present

## 2017-03-15 DIAGNOSIS — F332 Major depressive disorder, recurrent severe without psychotic features: Secondary | ICD-10-CM

## 2017-03-15 DIAGNOSIS — E119 Type 2 diabetes mellitus without complications: Secondary | ICD-10-CM

## 2017-03-15 DIAGNOSIS — I10 Essential (primary) hypertension: Secondary | ICD-10-CM | POA: Diagnosis not present

## 2017-03-15 DIAGNOSIS — Z23 Encounter for immunization: Secondary | ICD-10-CM

## 2017-03-15 LAB — POCT GLYCOSYLATED HEMOGLOBIN (HGB A1C): HEMOGLOBIN A1C: 5.9

## 2017-03-15 MED ORDER — SERTRALINE HCL 100 MG PO TABS: 200.0000 mg | ORAL_TABLET | Freq: Every day | ORAL | 0 refills | Status: DC

## 2017-03-15 MED ORDER — LISINOPRIL 10 MG PO TABS: 10.0000 mg | ORAL_TABLET | Freq: Every day | ORAL | 0 refills | Status: DC

## 2017-03-15 MED ORDER — LEVOTHYROXINE SODIUM 25 MCG PO TABS: ORAL_TABLET | ORAL | 0 refills | Status: DC

## 2017-03-15 NOTE — Progress Notes (Signed)
Subjective:    Patient ID: Kara Huynh, female    DOB: 26-Aug-1945, 71 y.o.   MRN: [id number]  HPI  71 year old female who  has a past medical history of Anxiety, Arthritis, Cancer ([place]), Depression, Endometrial polyp (02-17-13), Hyperlipidemia, Hypertension, Hypothyroidism, and Type 2 diabetes mellitus ([place]). She presents to the office today for refill of lisinopril.   She reports today that she has stopped taking all medications except for lisinopril and Zoloft. This includes synthroid and Lipitor as chronic disease medications.   Overall, she feels " pretty good".   She is going to make an appointment for her CPE  Review of Systems See HPI   Past Medical History:  Diagnosis Date  . Anxiety   . Arthritis   . Cancer ([place])    uterine  . Depression   . Endometrial polyp 02-17-13   11'14 Dx. endometrial cancer  . Hyperlipidemia   . Hypertension   . Hypothyroidism   . Type 2 diabetes mellitus ([place])     Social History   Socioeconomic History  . Marital status: Widowed    Spouse name: Not on file  . Number of children: Not on file  . Years of education: Not on file  . Highest education level: Not on file  Social Needs  . Financial resource strain: Not on file  . Food insecurity - worry: Not on file  . Food insecurity - inability: Not on file  . Transportation needs - medical: Not on file  . Transportation needs - non-medical: Not on file  Occupational History  . Not on file  Tobacco Use  . Smoking status: Former Smoker    Last attempt to quit: 07/04/2009    Years since quitting: 7.7  . Smokeless tobacco: Never Used  Substance and Sexual Activity  . Alcohol use: Yes    Comment: twice monthly  . Drug use: No  . Sexual activity: Yes    Partners: Male    Birth control/protection: Post-menopausal  Other Topics Concern  . Not on file  Social History Narrative   Retired from being an Web designer   Married for 35 years   3 step children and 1 adopted  child. They live all over the [name]    Past Surgical History:  Procedure Laterality Date  . APPENDECTOMY    . BREAST SURGERY     biopsy  . CATARACT EXTRACTION, BILATERAL Bilateral 02-17-13   bilateral  . DILITATION & CURRETTAGE/HYSTROSCOPY WITH VERSAPOINT RESECTION N/A 01/31/2013   Procedure: DILATATION & CURETTAGE/HYSTEROSCOPY WITH CERVICAL BLOCK;  Surgeon: Marylynn Pearson, MD;  Location: [facility];  Service: Gynecology;  Laterality: N/A;  . LAPAROSCOPY FOR ECTOPIC PREGNANCY  1977   SALPINGOSTOMY (SIDE UNKNOWN)  . LAPAROTOMY Bilateral 02/18/2013   Procedure: EXPLORATORY LAPAROTOMY TOTAL ABDMONIAL HYSTERECTOMY BILATERAL SALPINGO OOPHORECTOMY ;  Surgeon: Alvino Chapel, MD;  Location: [facility];  Service: Gynecology;  Laterality: Bilateral;  . TOTAL ABDOMINAL HYSTERECTOMY W/ BILATERAL SALPINGOOPHORECTOMY    . TOTAL KNEE ARTHROPLASTY Bilateral     Family History  Problem Relation Age of Onset  . Arthritis Mother   . Mental illness Mother   . Diabetes Mother   . Cancer Father        colon, lung, and prostate  . Arthritis Father   . Hyperlipidemia Father   . Stroke Father   . Heart disease Father   . Hypertension Father   . Colitis Father   . Esophageal cancer Neg Hx   . Rectal  cancer Neg Hx   . Stomach cancer Neg Hx     No Known Allergies  Current Outpatient Medications on File Prior to Visit  Medication Sig Dispense Refill  . lisinopril (PRINIVIL,ZESTRIL) 10 MG tablet TAKE 1 TABLET (10 MG TOTAL) BY MOUTH DAILY. 90 tablet 1  . sertraline (ZOLOFT) 100 MG tablet TAKE 2 TABLETS (200 MG TOTAL) BY MOUTH DAILY. 180 tablet 1  . ALPRAZolam (XANAX) 0.25 MG tablet Take 1 tablet (0.25 mg total) by mouth 2 (two) times daily as needed. 90 tablet 0  . atorvastatin (LIPITOR) 20 MG tablet TAKE 1 TABLET (20 MG TOTAL) BY MOUTH DAILY. 90 tablet 1  . Cholecalciferol (VITAMIN D) 2000 UNITS CAPS Take 1 capsule by mouth daily.     . Cyanocobalamin 6000 MCG SUBL Place 1 tablet (6,000 mcg total)  under the tongue daily. 90 tablet 1  . levothyroxine (SYNTHROID, LEVOTHROID) 25 MCG tablet TAKE 1 TABLET EVERY MORNING AS DIRECTED 90 tablet 1  . meloxicam (MOBIC) 15 MG tablet TAKE 1 TABLET BY MOUTH EVERY DAY 30 tablet 4  . metFORMIN (GLUCOPHAGE) 500 MG tablet TAKE 1 TABLET BY MOUTH TWICE A DAY WITH MEALS 180 tablet 1   No current facility-administered medications on file prior to visit.     BP 110/62 (BP Location: Right Arm, Patient Position: Sitting, Cuff Size: Large)   Temp 98 F (36.7 C) (Oral)   Ht 5' 4.25" (1.632 m)   Wt 204 lb 12.8 oz (92.9 kg)   BMI 34.88 kg/m       Objective:   Physical Exam  Constitutional: She is oriented to person, place, and time. She appears well-developed and well-nourished. No distress.  Cardiovascular: Normal rate, regular rhythm, normal heart sounds and intact distal pulses. Exam reveals no gallop and no friction rub.  No murmur heard. Pulmonary/Chest: Effort normal and breath sounds normal. No respiratory distress. She has no wheezes. She has no rales. She exhibits no tenderness.  Neurological: She is alert and oriented to person, place, and time.  Skin: Skin is warm and dry. No rash noted. She is not diaphoretic. No erythema. No pallor.  Psychiatric: She has a normal mood and affect. Her behavior is normal. Judgment and thought content normal.  Nursing note and vitals reviewed.     Assessment & Plan:  1. Diabetes mellitus without complication ([place]) - POCT A1C- 5.9 - I am ok with her staying off Metformin   2. Severe episode of recurrent major depressive disorder, without psychotic features ([place])  - sertraline (ZOLOFT) 100 MG tablet; Take 2 tablets (200 mg total) by mouth daily.  Dispense: 180 tablet; Refill: 0  3. Essential hypertension  - lisinopril (PRINIVIL,ZESTRIL) 10 MG tablet; Take 1 tablet (10 mg total) by mouth daily.  Dispense: 90 tablet; Refill: 0  4. Need for influenza vaccination  - Flu vaccine HIGH DOSE PF (Fluzone High  dose)  5. Hypothyroidism, unspecified type  - levothyroxine (SYNTHROID, LEVOTHROID) 25 MCG tablet; TAKE 1 TABLET EVERY MORNING AS DIRECTED  Dispense: 90 tablet; Refill: 0  Dorothyann Peng, NP

## 2017-04-04 ENCOUNTER — Encounter: Payer: Medicare HMO | Admitting: Adult Health

## 2017-04-11 ENCOUNTER — Ambulatory Visit (INDEPENDENT_AMBULATORY_CARE_PROVIDER_SITE_OTHER): Payer: Medicare HMO | Admitting: Adult Health

## 2017-04-11 ENCOUNTER — Encounter: Payer: Self-pay | Admitting: Adult Health

## 2017-04-11 ENCOUNTER — Ambulatory Visit: Payer: Medicare HMO

## 2017-04-11 VITALS — BP 106/60 | Temp 98.3°F | Ht 64.0 in | Wt 208.0 lb

## 2017-04-11 DIAGNOSIS — E119 Type 2 diabetes mellitus without complications: Secondary | ICD-10-CM | POA: Diagnosis not present

## 2017-04-11 DIAGNOSIS — Z0000 Encounter for general adult medical examination without abnormal findings: Secondary | ICD-10-CM | POA: Diagnosis not present

## 2017-04-11 DIAGNOSIS — E7800 Pure hypercholesterolemia, unspecified: Secondary | ICD-10-CM | POA: Diagnosis not present

## 2017-04-11 DIAGNOSIS — F339 Major depressive disorder, recurrent, unspecified: Secondary | ICD-10-CM | POA: Diagnosis not present

## 2017-04-11 DIAGNOSIS — E559 Vitamin D deficiency, unspecified: Secondary | ICD-10-CM | POA: Diagnosis not present

## 2017-04-11 DIAGNOSIS — Z794 Long term (current) use of insulin: Secondary | ICD-10-CM

## 2017-04-11 LAB — LIPID PANEL
CHOL/HDL RATIO: 5
Cholesterol: 280 mg/dL — ABNORMAL HIGH (ref 0–200)
HDL: 52.3 mg/dL (ref >39.00)
NonHDL: 227.74
Triglycerides: 226 mg/dL — ABNORMAL HIGH (ref 0.0–149.0)
VLDL: 45.2 mg/dL — AB (ref 0.0–40.0)

## 2017-04-11 LAB — CBC WITH DIFFERENTIAL/PLATELET
BASOS ABS: 0 10*3/uL (ref 0.0–0.1)
Basophils Relative: 0.6 % (ref 0.0–3.0)
EOS ABS: 0.2 10*3/uL (ref 0.0–0.7)
Eosinophils Relative: 3.6 % (ref 0.0–5.0)
HCT: 39.2 % (ref 36.0–46.0)
Hemoglobin: 12.9 g/dL (ref 12.0–15.0)
LYMPHS ABS: 1.9 10*3/uL (ref 0.7–4.0)
Lymphocytes Relative: 34.2 % (ref 12.0–46.0)
MCHC: 32.8 g/dL (ref 30.0–36.0)
MCV: 88.9 fl (ref 78.0–100.0)
MONO ABS: 0.2 10*3/uL (ref 0.1–1.0)
Monocytes Relative: 4.1 % (ref 3.0–12.0)
NEUTROS PCT: 57.5 % (ref 43.0–77.0)
Neutro Abs: 3.1 10*3/uL (ref 1.4–7.7)
Platelets: 227 10*3/uL (ref 150.0–400.0)
RBC: 4.41 Mil/uL (ref 3.87–5.11)
RDW: 15.2 % (ref 11.5–15.5)
WBC: 5.4 10*3/uL (ref 4.0–10.5)

## 2017-04-11 LAB — BASIC METABOLIC PANEL
BUN: 18 mg/dL (ref 6–23)
CALCIUM: 9.1 mg/dL (ref 8.4–10.5)
CO2: 26 mEq/L (ref 19–32)
CREATININE: 1.25 mg/dL — AB (ref 0.40–1.20)
Chloride: 105 mEq/L (ref 96–112)
GFR: 44.59 mL/min — AB (ref >60.00)
GLUCOSE: 103 mg/dL — AB (ref 70–99)
Potassium: 4 mEq/L (ref 3.5–5.1)
SODIUM: 142 meq/L (ref 135–145)

## 2017-04-11 LAB — HEPATIC FUNCTION PANEL
ALK PHOS: 63 U/L (ref 39–117)
ALT: 13 U/L (ref 0–35)
AST: 15 U/L (ref 0–37)
Albumin: 4.3 g/dL (ref 3.5–5.2)
BILIRUBIN TOTAL: 0.5 mg/dL (ref 0.2–1.2)
Bilirubin, Direct: 0.1 mg/dL (ref 0.0–0.3)
Total Protein: 6.8 g/dL (ref 6.0–8.3)

## 2017-04-11 LAB — LDL CHOLESTEROL, DIRECT: Direct LDL: 185 mg/dL

## 2017-04-11 LAB — VITAMIN D 25 HYDROXY (VIT D DEFICIENCY, FRACTURES): VITD: 30.41 ng/mL (ref 30.00–100.00)

## 2017-04-11 LAB — TSH: TSH: 1.38 u[IU]/mL (ref 0.35–4.50)

## 2017-04-11 MED ORDER — FLUOXETINE HCL 20 MG PO CAPS: 20.0000 mg | ORAL_CAPSULE | Freq: Every day | ORAL | 1 refills | Status: DC

## 2017-04-11 NOTE — Progress Notes (Deleted)
Subjective:   Kara Huynh is a 72 y.o. female who presents for Medicare Annual (Subsequent) preventive examination.  Reports health as Controlled DM  Hx of endometrial cancer Seen Cory N on 03/15/17   Diet - A1c was 5.9  Chol 3; hdl 57; ldl 89; trig 147   Exercise  Former smoker; quit 2011    Health Maintenance Due  Topic Date Due  . OPHTHALMOLOGY EXAM  07/29/2015  . FOOT EXAM  07/06/2016     Colonoscopy 06/2013 Mammogram 08/2015 Bone density 08/2015- normal  Pap 07/2014; (? TAH)       Objective:     Vitals: There were no vitals taken for this visit.  There is no height or weight on file to calculate BMI.  Advanced Directives 02/18/2013 02/17/2013 01/30/2013  Does Patient Have a Medical Advance Directive? Patient does not have advance directive Patient does not have advance directive Patient does not have advance directive  Pre-existing out of facility DNR order (yellow form or pink MOST form) No No -    Tobacco Social History   Tobacco Use  Smoking Status Former Smoker  . Last attempt to quit: 07/04/2009  . Years since quitting: 7.7  Smokeless Tobacco Never Used     Counseling given: Not Answered   Clinical Intake:      Past Medical History:  Diagnosis Date  . Anxiety   . Arthritis   . Cancer ([place])    uterine  . Depression   . Endometrial polyp 02-17-13   11'14 Dx. endometrial cancer  . Hyperlipidemia   . Hypertension   . Hypothyroidism   . Type 2 diabetes mellitus ([place])    Past Surgical History:  Procedure Laterality Date  . APPENDECTOMY    . BREAST SURGERY     biopsy  . CATARACT EXTRACTION, BILATERAL Bilateral 02-17-13   bilateral  . DILITATION & CURRETTAGE/HYSTROSCOPY WITH VERSAPOINT RESECTION N/A 01/31/2013   Procedure: DILATATION & CURETTAGE/HYSTEROSCOPY WITH CERVICAL BLOCK;  Surgeon: Marylynn Pearson, MD;  Location: [facility];  Service: Gynecology;  Laterality: N/A;  . LAPAROSCOPY FOR ECTOPIC PREGNANCY  1977   SALPINGOSTOMY (SIDE  UNKNOWN)  . LAPAROTOMY Bilateral 02/18/2013   Procedure: EXPLORATORY LAPAROTOMY TOTAL ABDMONIAL HYSTERECTOMY BILATERAL SALPINGO OOPHORECTOMY ;  Surgeon: Alvino Chapel, MD;  Location: [facility];  Service: Gynecology;  Laterality: Bilateral;  . TOTAL ABDOMINAL HYSTERECTOMY W/ BILATERAL SALPINGOOPHORECTOMY    . TOTAL KNEE ARTHROPLASTY Bilateral    Family History  Problem Relation Age of Onset  . Arthritis Mother   . Mental illness Mother   . Diabetes Mother   . Cancer Father        colon, lung, and prostate  . Arthritis Father   . Hyperlipidemia Father   . Stroke Father   . Heart disease Father   . Hypertension Father   . Colitis Father   . Esophageal cancer Neg Hx   . Rectal cancer Neg Hx   . Stomach cancer Neg Hx    Social History   Socioeconomic History  . Marital status: Widowed    Spouse name: Not on file  . Number of children: Not on file  . Years of education: Not on file  . Highest education level: Not on file  Social Needs  . Financial resource strain: Not on file  . Food insecurity - worry: Not on file  . Food insecurity - inability: Not on file  . Transportation needs - medical: Not on file  . Transportation needs - non-medical: Not  on file  Occupational History  . Not on file  Tobacco Use  . Smoking status: Former Smoker    Last attempt to quit: 07/04/2009    Years since quitting: 7.7  . Smokeless tobacco: Never Used  Substance and Sexual Activity  . Alcohol use: Yes    Comment: twice monthly  . Drug use: No  . Sexual activity: Yes    Partners: Male    Birth control/protection: Post-menopausal  Other Topics Concern  . Not on file  Social History Narrative   Retired from being an Web designer   Married for 35 years   3 step children and 1 adopted child. They live all over the [name]    Outpatient Encounter Medications as of 04/11/2017  Medication Sig  . atorvastatin (LIPITOR) 20 MG tablet TAKE 1 TABLET (20 MG TOTAL) BY MOUTH DAILY.  Marland Kitchen  Cholecalciferol (VITAMIN D) 2000 UNITS CAPS Take 1 capsule by mouth daily.   . Cyanocobalamin 6000 MCG SUBL Place 1 tablet (6,000 mcg total) under the tongue daily.  Marland Kitchen levothyroxine (SYNTHROID, LEVOTHROID) 25 MCG tablet TAKE 1 TABLET EVERY MORNING AS DIRECTED  . lisinopril (PRINIVIL,ZESTRIL) 10 MG tablet Take 1 tablet (10 mg total) by mouth daily.  . meloxicam (MOBIC) 15 MG tablet TAKE 1 TABLET BY MOUTH EVERY DAY  . sertraline (ZOLOFT) 100 MG tablet Take 2 tablets (200 mg total) by mouth daily.   No facility-administered encounter medications on file as of 04/11/2017.     Activities of Daily Living No flowsheet data found.  Patient Care Team: Dorothyann Peng, NP as PCP - General (Family Medicine)    Assessment:   This is a routine wellness examination for Kara Huynh.  Exercise Activities and Dietary recommendations    Goals    None      Fall Risk Fall Risk  07/07/2015 05/01/2014  Falls in the past year? No No     Depression Screen PHQ 2/9 Scores 07/07/2015 05/01/2014  PHQ - 2 Score 0 0     Cognitive Function        Immunization History  Administered Date(s) Administered  . Influenza, High Dose Seasonal PF 03/15/2017  . Pneumococcal Conjugate-13 10/07/2014  . Pneumococcal Polysaccharide-23 08/01/2012  . Tetanus 08/01/2012  . Zoster 12/03/2015    Qualifies for Shingles Vaccine? Screening Tests Health Maintenance  Topic Date Due  . OPHTHALMOLOGY EXAM  07/29/2015  . FOOT EXAM  07/06/2016  . MAMMOGRAM  09/07/2017  . HEMOGLOBIN A1C  09/13/2017  . COLONOSCOPY  07/05/2018  . TETANUS/TDAP  08/02/2022  . INFLUENZA VACCINE  Completed  . DEXA SCAN  Completed  . Hepatitis C Screening  Completed  . PNA vac Low Risk Adult  Completed        Plan:      PCP Notes ***  Health Maintenance ***  Abnormal Screens  ***  Referrals  ***  Patient concerns; ***  Nurse Concerns; ***  Next PCP apt ***      I have personally reviewed and noted the following in  the patient's chart:   . Medical and social history . Use of alcohol, tobacco or illicit drugs  . Current medications and supplements . Functional ability and status . Nutritional status . Physical activity . Advanced directives . List of other physicians . Hospitalizations, surgeries, and ER visits in previous 12 months . Vitals . Screenings to include cognitive, depression, and falls . Referrals and appointments  In addition, I have reviewed and discussed with patient certain preventive protocols,  quality metrics, and best practice recommendations. A written personalized care plan for preventive services as well as general preventive health recommendations were provided to patient.     Wynetta Fines, RN  04/11/2017

## 2017-04-11 NOTE — Progress Notes (Signed)
Subjective:    Patient ID: Kara Huynh, female    DOB: April 20, 1945, 72 y.o.   MRN: [id number]  HPI Patient presents for yearly preventative medicine examination. She is a pleasant 72 year old female who  has a past medical history of Anxiety, Arthritis, Cancer ([place]), Depression, Endometrial polyp (02-17-13), Hyperlipidemia, Hypertension, Hypothyroidism, and Type 2 diabetes mellitus ([place]).   Diabetes mellitus, controlled-her A1c in December was 5.9.  At this time we decided to DC metformin.  She has not been checking her blood sugars at home and diet has not changed.  Essential hypertension-she takes lisinopril 10 mg tablets her blood pressure has been well controlled with this medication.  She denies any dizziness or lightheaded  Hypothyroidism-hold with Synthroid 25 mcg  Depression-has been a long-standing issue since her husband's death a couple of years ago.  Currently on Zoloft 100 mg tablets daily.  She does not feel as though Zoloft is working well for her any longer.  She would like to try some other type of medication.  Denies any suicidal ideation  Hyperlipidemia -was prescribed a statin but is no longer taking it for unknown reasons.  She would like to see what her cholesterol level is before we restart.   All immunizations and health maintenance protocols were reviewed with the patient and needed orders were placed. She is up to date on her vaccinations   Appropriate screening laboratory values were ordered for the patient including screening of hyperlipidemia, renal function and hepatic function.  Medication reconciliation,  past medical history, social history, problem list and allergies were reviewed in detail with the patient  Goals were established with regard to weight loss, exercise, and  diet in compliance with medications.  She does not follow a specific diet but tries to eat healthy.  She does not do routine exercise and does not leave her home on a regular basis  except to do chores  End of life planning was discussed.  She has an advanced directive and living will.   She is up-to-date on her mammogram and colonoscopy.  She does partake in teen dental and vision exams.   Review of Systems  Constitutional: Negative.   HENT: Negative.   Eyes: Negative.   Respiratory: Negative.   Cardiovascular: Negative.   Gastrointestinal: Negative.   Endocrine: Negative.   Genitourinary: Negative.   Musculoskeletal: Negative.   Skin: Negative.   Allergic/Immunologic: Negative.   Neurological: Negative.   Hematological: Negative.   Psychiatric/Behavioral: Negative for sleep disturbance and suicidal ideas. The patient is not nervous/anxious.        Depression   All other systems reviewed and are negative.    Past Medical History:  Diagnosis Date  . Anxiety   . Arthritis   . Cancer ([place])    uterine  . Depression   . Endometrial polyp 02-17-13   11'14 Dx. endometrial cancer  . Hyperlipidemia   . Hypertension   . Hypothyroidism   . Type 2 diabetes mellitus ([place])     Social History   Socioeconomic History  . Marital status: Widowed    Spouse name: Not on file  . Number of children: Not on file  . Years of education: Not on file  . Highest education level: Not on file  Social Needs  . Financial resource strain: Not on file  . Food insecurity - worry: Not on file  . Food insecurity - inability: Not on file  . Transportation needs - medical: Not on file  .  Transportation needs - non-medical: Not on file  Occupational History  . Not on file  Tobacco Use  . Smoking status: Former Smoker    Last attempt to quit: 07/04/2009    Years since quitting: 7.7  . Smokeless tobacco: Never Used  Substance and Sexual Activity  . Alcohol use: Yes    Comment: twice monthly  . Drug use: No  . Sexual activity: Yes    Partners: Male    Birth control/protection: Post-menopausal  Other Topics Concern  . Not on file  Social History Narrative   Retired  from being an Web designer   Married for 35 years   3 step children and 1 adopted child. They live all over the [name]    Past Surgical History:  Procedure Laterality Date  . APPENDECTOMY    . BREAST SURGERY     biopsy  . CATARACT EXTRACTION, BILATERAL Bilateral 02-17-13   bilateral  . DILITATION & CURRETTAGE/HYSTROSCOPY WITH VERSAPOINT RESECTION N/A 01/31/2013   Procedure: DILATATION & CURETTAGE/HYSTEROSCOPY WITH CERVICAL BLOCK;  Surgeon: Marylynn Pearson, MD;  Location: [facility];  Service: Gynecology;  Laterality: N/A;  . LAPAROSCOPY FOR ECTOPIC PREGNANCY  1977   SALPINGOSTOMY (SIDE UNKNOWN)  . LAPAROTOMY Bilateral 02/18/2013   Procedure: EXPLORATORY LAPAROTOMY TOTAL ABDMONIAL HYSTERECTOMY BILATERAL SALPINGO OOPHORECTOMY ;  Surgeon: Alvino Chapel, MD;  Location: [facility];  Service: Gynecology;  Laterality: Bilateral;  . TOTAL ABDOMINAL HYSTERECTOMY W/ BILATERAL SALPINGOOPHORECTOMY    . TOTAL KNEE ARTHROPLASTY Bilateral     Family History  Problem Relation Age of Onset  . Arthritis Mother   . Mental illness Mother   . Diabetes Mother   . Cancer Father        colon, lung, and prostate  . Arthritis Father   . Hyperlipidemia Father   . Stroke Father   . Heart disease Father   . Hypertension Father   . Colitis Father   . Esophageal cancer Neg Hx   . Rectal cancer Neg Hx   . Stomach cancer Neg Hx     No Known Allergies  Current Outpatient Medications on File Prior to Visit  Medication Sig Dispense Refill  . levothyroxine (SYNTHROID, LEVOTHROID) 25 MCG tablet TAKE 1 TABLET EVERY MORNING AS DIRECTED 90 tablet 0  . lisinopril (PRINIVIL,ZESTRIL) 10 MG tablet Take 1 tablet (10 mg total) by mouth daily. 90 tablet 0  . sertraline (ZOLOFT) 100 MG tablet Take 2 tablets (200 mg total) by mouth daily. 180 tablet 0   No current facility-administered medications on file prior to visit.     BP 106/60 (BP Location: Left Arm)   Temp 98.3 F (36.8 C) (Oral)   Ht 5\' 4"   (1.626 m)   Wt 208 lb (94.3 kg)   BMI 35.70 kg/m       Objective:   Physical Exam  Constitutional: She is oriented to person, place, and time. She appears well-developed and well-nourished. No distress.  Overweight  HENT:  Head: Normocephalic and atraumatic.  Right Ear: External ear normal.  Left Ear: External ear normal.  Nose: Nose normal.  Mouth/Throat: Oropharynx is clear and moist. No oropharyngeal exudate.  Eyes: Conjunctivae and EOM are normal. Pupils are equal, round, and reactive to light. Right eye exhibits no discharge. Left eye exhibits no discharge. No scleral icterus.  Neck: Normal range of motion. Neck supple. JVD present. Carotid bruit is not present. No tracheal deviation present. No thyroid mass and no thyromegaly present.  Cardiovascular: Normal rate, regular rhythm, normal heart  sounds and intact distal pulses. Exam reveals no gallop and no friction rub.  No murmur heard. Pulmonary/Chest: Effort normal and breath sounds normal. No stridor. No respiratory distress. She has no wheezes. She has no rales. She exhibits no tenderness.  Abdominal: Soft. Bowel sounds are normal. She exhibits no distension and no mass. There is no tenderness. There is no rebound and no guarding.  Genitourinary:  Genitourinary Comments: Deferred breast exam  Musculoskeletal: Normal range of motion. She exhibits no edema, tenderness or deformity.  Lymphadenopathy:    She has no cervical adenopathy.  Neurological: She is alert and oriented to person, place, and time. She has normal reflexes. She displays normal reflexes. No cranial nerve deficit. She exhibits normal muscle tone. Coordination normal.  Skin: Skin is warm and dry. No rash noted. She is not diaphoretic. No erythema. No pallor.  Psychiatric: She has a normal mood and affect. Her behavior is normal. Judgment and thought content normal.  Nursing note and vitals reviewed.      Assessment & Plan:  1. Routine general medical  examination at a health care facility -I would like her to frame from being inside all day.  Advised to find a hobby that she enjoys and start partaking in this outside of the home. -I would like for her to get into the gym and start working out believe this will be good for her overall well-being as well as helping manage her depression -Follow-up in 1 year for CPE - Basic metabolic panel - CBC with Differential/Platelet - Hepatic function panel - Lipid panel - TSH  2. Depression, recurrent ([place]) -Continues to be fixated on her husband's untimely death. -We will switch from Zoloft 100 mg to Prozac 20 mg tablets daily.  Advised to follow-up if she goes through any withdrawal symptoms.  She was also advised to go to the emergency room with any suicidal ideation -Follow-up with this when she returns for an A1c check  3. Controlled type 2 diabetes mellitus without complication, with long-term current use of insulin ([facility]) -A1c was checked a month ago.  Advised to monitor blood sugars at home and eat a heart healthy diet -She will follow-up in March for repeat A1c  4. Pure hypercholesterolemia -Consider placing on statin - Basic metabolic panel - CBC with Differential/Platelet - Hepatic function panel - Lipid panel - TSH  5. Vitamin D deficiency  - Vitamin D, 25-hydroxy   Dorothyann Peng, NP

## 2017-04-11 NOTE — Patient Instructions (Signed)
It was great seeing you today   I will follow up with you regarding your labs   I have started you on Prozac, stop the Zoloft.   Follow up in 2 months for A1c and follow up regarding depression

## 2017-04-12 ENCOUNTER — Other Ambulatory Visit: Payer: Self-pay | Admitting: Family Medicine

## 2017-04-12 MED ORDER — ATORVASTATIN CALCIUM 20 MG PO TABS
20.0000 mg | ORAL_TABLET | Freq: Every day | ORAL | 3 refills | Status: DC
Start: 2017-04-12 — End: 2018-03-29

## 2017-04-12 NOTE — Telephone Encounter (Signed)
Sent to the pharmacy by e-scribe. 

## 2017-04-23 ENCOUNTER — Ambulatory Visit: Payer: Self-pay

## 2017-04-23 NOTE — Telephone Encounter (Signed)
  Reason for Disposition . [1] Sinus congestion as part of a cold AND [2] present < 10 days  Answer Assessment - Initial Assessment Questions 1. LOCATION: "Where does it hurt?"      No pain 2. ONSET: "When did the sinus pain start?"  (e.g., hours, days)      Started 2-3 days 3. SEVERITY: "How bad is the pain?"   (Scale 1-10; mild, moderate or severe)   - MILD (1-3): doesn't interfere with normal activities    - MODERATE (4-7): interferes with normal activities (e.g., work or school) or awakens from sleep   - SEVERE (8-10): excruciating pain and patient unable to do any normal activities        No 4. RECURRENT SYMPTOM: "Have you ever had sinus problems before?" If so, ask: "When was the last time?" and "What happened that time?"      Yes 5. NASAL CONGESTION: "Is the nose blocked?" If so, ask, "Can you open it or must you breathe through the mouth?"     Blocked now 6. NASAL DISCHARGE: "Do you have discharge from your nose?" If so ask, "What color?"     Clear 7. FEVER: "Do you have a fever?" If so, ask: "What is it, how was it measured, and when did it start?"      Low grade fever 8. OTHER SYMPTOMS: "Do you have any other symptoms?" (e.g., sore throat, cough, earache, difficulty breathing)     Cough, sore throat 9. PREGNANCY: "Is there any chance you are pregnant?" "When was your last menstrual period?"     No  Protocols used: SINUS PAIN OR CONGESTION-A-AH Pt. Will try home remedies. If no better in 5-7 days, will call back.

## 2017-06-06 ENCOUNTER — Other Ambulatory Visit: Payer: Self-pay | Admitting: Adult Health

## 2017-06-06 NOTE — Telephone Encounter (Signed)
Sent to the pharmacy by e-scribe.  I scheduled pt to see Cory on 06/12/17 for follow up A1C and depression per last note.

## 2017-06-06 NOTE — Telephone Encounter (Signed)
Due for appt.

## 2017-06-08 ENCOUNTER — Ambulatory Visit
Admission: RE | Admit: 2017-06-08 | Discharge: 2017-06-08 | Disposition: A | Discharge status: Home / Self Care | Payer: Medicare HMO | Source: Ambulatory Visit | Attending: Adult Health | Admitting: Adult Health

## 2017-06-08 DIAGNOSIS — Z1231 Encounter for screening mammogram for malignant neoplasm of breast: Secondary | ICD-10-CM

## 2017-06-12 ENCOUNTER — Other Ambulatory Visit: Payer: Self-pay | Admitting: Adult Health

## 2017-06-12 ENCOUNTER — Ambulatory Visit (INDEPENDENT_AMBULATORY_CARE_PROVIDER_SITE_OTHER): Payer: Medicare HMO | Admitting: Adult Health

## 2017-06-12 ENCOUNTER — Encounter: Payer: Self-pay | Admitting: Adult Health

## 2017-06-12 VITALS — BP 140/74 | Temp 98.7°F | Wt 209.0 lb

## 2017-06-12 DIAGNOSIS — F331 Major depressive disorder, recurrent, moderate: Secondary | ICD-10-CM

## 2017-06-12 DIAGNOSIS — E119 Type 2 diabetes mellitus without complications: Secondary | ICD-10-CM | POA: Diagnosis not present

## 2017-06-12 DIAGNOSIS — Z794 Long term (current) use of insulin: Secondary | ICD-10-CM

## 2017-06-12 DIAGNOSIS — I10 Essential (primary) hypertension: Secondary | ICD-10-CM

## 2017-06-12 DIAGNOSIS — E039 Hypothyroidism, unspecified: Secondary | ICD-10-CM

## 2017-06-12 LAB — POCT GLYCOSYLATED HEMOGLOBIN (HGB A1C): HEMOGLOBIN A1C: 5.7

## 2017-06-12 MED ORDER — FLUOXETINE HCL 40 MG PO CAPS: 40.0000 mg | ORAL_CAPSULE | Freq: Every day | ORAL | 0 refills | Status: DC

## 2017-06-12 NOTE — Progress Notes (Signed)
Subjective:    Patient ID: Kara Huynh, female    DOB: Jul 10, 1945, 72 y.o.   MRN: [id number]  HPI 72 year old female who  has a past medical history of Anxiety, Arthritis, Cancer ([place]), Depression, Endometrial polyp (02-17-13), Hyperlipidemia, Hypertension, Hypothyroidism, and Type 2 diabetes mellitus ([place]). She presents to the office today for follow up regarding diabetes and anxiety   Her diabetes is controlled with diet. Her last A1c was 5.9. She has been trying to eat a heart healthy diet but is not exercising.   Depression-has been a long-standing issue since her husband's death a couple of years ago.  Currently on Prozac 20 mg tablets daily. During her CPE in January 2018 she was switched from Zoloft to Prozac 20 mg as she did not feel as though Zoloft was working well for her any longer. She reports that she is taking Prozac 20 mg daily. She does not feel as though she is improving. She continues to feel depressed and is not leaving the house. She denies any suicidal ideation.   Review of Systems See HPI   Past Medical History:  Diagnosis Date  . Anxiety   . Arthritis   . Cancer ([place])    uterine  . Depression   . Endometrial polyp 02-17-13   11'14 Dx. endometrial cancer  . Hyperlipidemia   . Hypertension   . Hypothyroidism   . Type 2 diabetes mellitus ([place])     Social History   Socioeconomic History  . Marital status: Widowed    Spouse name: Not on file  . Number of children: Not on file  . Years of education: Not on file  . Highest education level: Not on file  Occupational History  . Not on file  Social Needs  . Financial resource strain: Not on file  . Food insecurity:    Worry: Not on file    Inability: Not on file  . Transportation needs:    Medical: Not on file    Non-medical: Not on file  Tobacco Use  . Smoking status: Former Smoker    Last attempt to quit: 07/04/2009    Years since quitting: 7.9  . Smokeless tobacco: Never Used  Substance and Sexual  Activity  . Alcohol use: Yes    Comment: twice monthly  . Drug use: No  . Sexual activity: Yes    Partners: Male    Birth control/protection: Post-menopausal  Lifestyle  . Physical activity:    Days per week: Not on file    Minutes per session: Not on file  . Stress: Not on file  Relationships  . Social connections:    Talks on phone: Not on file    Gets together: Not on file    Attends religious service: Not on file    Active member of club or organization: Not on file    Attends meetings of clubs or organizations: Not on file    Relationship status: Not on file  . Intimate partner violence:    Fear of current or ex partner: Not on file    Emotionally abused: Not on file    Physically abused: Not on file    Forced sexual activity: Not on file  Other Topics Concern  . Not on file  Social History Narrative   Retired from being an Web designer   Married for 35 years   3 step children and 1 adopted child. They live all over the [name]    Past Surgical  History:  Procedure Laterality Date  . APPENDECTOMY    . BREAST SURGERY     biopsy  . CATARACT EXTRACTION, BILATERAL Bilateral 02-17-13   bilateral  . DILITATION & CURRETTAGE/HYSTROSCOPY WITH VERSAPOINT RESECTION N/A 01/31/2013   Procedure: DILATATION & CURETTAGE/HYSTEROSCOPY WITH CERVICAL BLOCK;  Surgeon: Marylynn Pearson, MD;  Location: [facility];  Service: Gynecology;  Laterality: N/A;  . LAPAROSCOPY FOR ECTOPIC PREGNANCY  1977   SALPINGOSTOMY (SIDE UNKNOWN)  . LAPAROTOMY Bilateral 02/18/2013   Procedure: EXPLORATORY LAPAROTOMY TOTAL ABDMONIAL HYSTERECTOMY BILATERAL SALPINGO OOPHORECTOMY ;  Surgeon: Alvino Chapel, MD;  Location: [facility];  Service: Gynecology;  Laterality: Bilateral;  . TOTAL ABDOMINAL HYSTERECTOMY W/ BILATERAL SALPINGOOPHORECTOMY    . TOTAL KNEE ARTHROPLASTY Bilateral     Family History  Problem Relation Age of Onset  . Arthritis Mother   . Mental illness Mother   . Diabetes Mother   .  Cancer Father        colon, lung, and prostate  . Arthritis Father   . Hyperlipidemia Father   . Stroke Father   . Heart disease Father   . Hypertension Father   . Colitis Father   . Esophageal cancer Neg Hx   . Rectal cancer Neg Hx   . Stomach cancer Neg Hx     No Known Allergies  Current Outpatient Medications on File Prior to Visit  Medication Sig Dispense Refill  . atorvastatin (LIPITOR) 20 MG tablet Take 1 tablet (20 mg total) by mouth daily. 90 tablet 3  . FLUoxetine (PROZAC) 20 MG capsule TAKE 1 CAPSULE BY MOUTH EVERY DAY 30 capsule 0  . levothyroxine (SYNTHROID, LEVOTHROID) 25 MCG tablet TAKE 1 TABLET EVERY MORNING AS DIRECTED 90 tablet 0  . lisinopril (PRINIVIL,ZESTRIL) 10 MG tablet Take 1 tablet (10 mg total) by mouth daily. 90 tablet 0   No current facility-administered medications on file prior to visit.     BP 140/74 (BP Location: Left Arm)   Temp 98.7 F (37.1 C) (Oral)   Wt 209 lb (94.8 kg)   BMI 35.87 kg/m       Objective:   Physical Exam  Constitutional: She is oriented to person, place, and time. She appears well-developed and well-nourished. No distress.  Cardiovascular: Normal rate, regular rhythm, normal heart sounds and intact distal pulses. Exam reveals no gallop and no friction rub.  No murmur heard. Pulmonary/Chest: Effort normal and breath sounds normal. No respiratory distress. She has no wheezes. She has no rales. She exhibits no tenderness.  Neurological: She is alert and oriented to person, place, and time.  Skin: Skin is warm and dry. No rash noted. No erythema. No pallor.  Psychiatric: She has a normal mood and affect. Her behavior is normal. Judgment and thought content normal.  Nursing note and vitals reviewed.     Assessment & Plan:  1. Controlled type 2 diabetes mellitus without complication, with long-term current use of insulin (HCC)  - POCT A1C- 5.7 - has improved   2. Moderate episode of recurrent major depressive disorder  (HCC) - Will increase Prozac to 40 mg. Advised psychiatry or therapy - she refuses. Advised finding a hobby that she is interested in or finding a support circle. She is willing to do this  - FLUoxetine (PROZAC) 40 MG capsule; Take 1 capsule (40 mg total) by mouth daily.  Dispense: 90 capsule; Refill: 0   Dorothyann Peng, NP

## 2017-06-13 NOTE — Telephone Encounter (Signed)
Sent to the pharmacy by e-scribe. 

## 2017-07-05 ENCOUNTER — Other Ambulatory Visit: Payer: Self-pay | Admitting: Adult Health

## 2017-07-05 NOTE — Telephone Encounter (Signed)
FILLED ON 06/12/17 FOR 90 DAYS.  MESSAGE SENT TO THE PHARMACY TO CHECK FILE.

## 2017-09-06 ENCOUNTER — Other Ambulatory Visit: Payer: Self-pay | Admitting: Adult Health

## 2017-09-06 DIAGNOSIS — F331 Major depressive disorder, recurrent, moderate: Secondary | ICD-10-CM

## 2017-09-06 NOTE — Telephone Encounter (Signed)
Sent to the pharmacy by e-scribe. 

## 2017-09-25 NOTE — Progress Notes (Signed)
Subjective:   Kara Huynh is a 72 y.o. female who presents for Medicare Annual (Subsequent) preventive examination.  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 Had a very difficult time with chemo   Family is in [place], [place], [place]  Thinks about moving to [facility] to be near her grands Has a son  Has step children ( still the girls)   Diet BMI 33 Has lost weight  His cooking for herself;  Not eating much meat (red) Fresh vegetables  Was eating a lot of sugar   Lost 9 lbs are more  Exercise Does not exercise Walking and cleaning  Staying busy     Former smoker with quit date 2011  30 + pack history and referred to [facility] Maintenance Due  Topic Date Due  . OPHTHALMOLOGY EXAM  07/29/2015   Will schedule her eye exam  Will have diabetic eye check Mammogram 3 / 2019 Bone density  08/2015  Looks normal    Cardiac Risk Factors include: advanced age (>82men, >60 women);dyslipidemia;family history of premature cardiovascular disease;hypertension;obesity (BMI >30kg/m2)Colonoscopy 2020      Objective:     Vitals: BP 118/70   Pulse 64   Ht 5' 4.5" (1.638 m)   Wt 200 lb (90.7 kg)   SpO2 98%   BMI 33.80 kg/m   Body mass index is 33.8 kg/m.  Advanced Directives 09/26/2017 02/18/2013 02/17/2013 01/30/2013  Does Patient Have a Medical Advance Directive? No Patient does not have advance directive Patient does not have advance directive Patient does not have advance directive  Pre-existing out of facility DNR order (yellow form or pink MOST form) - No No -    Tobacco Social History   Tobacco Use  Smoking Status Former Smoker  . Packs/day: 2.00  . Years: 25.00  . Pack years: 50.00  . Last attempt to quit: 07/04/2009  . Years since quitting: 8.2  Smokeless Tobacco Never Used  Tobacco Comment   quit a long time ago     Counseling given: Yes Comment: quit  a long time ago   Clinical Intake:   Past Medical History:  Diagnosis Date  . Anxiety   . Arthritis   . Cancer ([place])    uterine  . Depression   . Endometrial polyp 02-17-13   11'14 Dx. endometrial cancer  . Hyperlipidemia   . Hypertension   . Hypothyroidism   . Type 2 diabetes mellitus ([place])    Past Surgical History:  Procedure Laterality Date  . APPENDECTOMY    . BREAST SURGERY     biopsy  . CATARACT EXTRACTION, BILATERAL Bilateral 02-17-13   bilateral  . DILITATION & CURRETTAGE/HYSTROSCOPY WITH VERSAPOINT RESECTION N/A 01/31/2013   Procedure: DILATATION & CURETTAGE/HYSTEROSCOPY WITH CERVICAL BLOCK;  Surgeon: Marylynn Pearson, MD;  Location: [facility];  Service: Gynecology;  Laterality: N/A;  . LAPAROSCOPY FOR ECTOPIC PREGNANCY  1977   SALPINGOSTOMY (SIDE UNKNOWN)  . LAPAROTOMY Bilateral 02/18/2013   Procedure: EXPLORATORY LAPAROTOMY TOTAL ABDMONIAL HYSTERECTOMY BILATERAL SALPINGO OOPHORECTOMY ;  Surgeon: Alvino Chapel, MD;  Location: [facility];  Service: Gynecology;  Laterality: Bilateral;  . TOTAL ABDOMINAL HYSTERECTOMY W/ BILATERAL SALPINGOOPHORECTOMY    . TOTAL KNEE ARTHROPLASTY Bilateral    Family History  Problem Relation Age of Onset  . Arthritis Mother   . Mental illness Mother   .  Diabetes Mother   . Cancer Father        colon, lung, and prostate  . Arthritis Father   . Hyperlipidemia Father   . Stroke Father   . Heart disease Father   . Hypertension Father   . Colitis Father   . Esophageal cancer Neg Hx   . Rectal cancer Neg Hx   . Stomach cancer Neg Hx    Social History   Socioeconomic History  . Marital status: Widowed    Spouse name: Not on file  . Number of children: Not on file  . Years of education: Not on file  . Highest education level: Not on file  Occupational History  . Not on file  Social Needs  . Financial resource strain: Not on file  . Food insecurity:    Worry: Not on file    Inability: Not on file  . Transportation needs:     Medical: Not on file    Non-medical: Not on file  Tobacco Use  . Smoking status: Former Smoker    Packs/day: 2.00    Years: 25.00    Pack years: 50.00    Last attempt to quit: 07/04/2009    Years since quitting: 8.2  . Smokeless tobacco: Never Used  . Tobacco comment: quit a long time ago  Substance and Sexual Activity  . Alcohol use: Yes    Comment: twice monthly; had her 1st glass of wine in 2 year   . Drug use: No  . Sexual activity: Yes    Partners: Male    Birth control/protection: Post-menopausal  Lifestyle  . Physical activity:    Days per week: Not on file    Minutes per session: Not on file  . Stress: Not on file  Relationships  . Social connections:    Talks on phone: Not on file    Gets together: Not on file    Attends religious service: Not on file    Active member of club or organization: Not on file    Attends meetings of clubs or organizations: Not on file    Relationship status: Not on file  Other Topics Concern  . Not on file  Social History Narrative   Retired from being an Web designer   Married for 35 years   3 step children and 1 adopted child. They live all over the [name]    Outpatient Encounter Medications as of 09/26/2017  Medication Sig  . atorvastatin (LIPITOR) 20 MG tablet Take 1 tablet (20 mg total) by mouth daily.  Marland Kitchen FLUoxetine (PROZAC) 40 MG capsule TAKE 1 CAPSULE BY MOUTH EVERY DAY  . levothyroxine (SYNTHROID, LEVOTHROID) 25 MCG tablet TAKE 1 TABLET EVERY MORNING AS DIRECTED  . lisinopril (PRINIVIL,ZESTRIL) 10 MG tablet TAKE 1 TABLET BY MOUTH EVERY DAY   No facility-administered encounter medications on file as of 09/26/2017.     Activities of Daily Living In your present state of health, do you have any difficulty performing the following activities: 09/26/2017  Hearing? N  Vision? Y  Comment needs glasses   Difficulty concentrating or making decisions? N  Walking or climbing stairs? N  Dressing or bathing? N  Doing  errands, shopping? N  Preparing Food and eating ? N  Using the Toilet? N  In the past six months, have you accidently leaked urine? N  Do you have problems with loss of bowel control? N  Managing your Medications? N  Managing your Finances? N  Housekeeping or managing your  Housekeeping? N  Some recent data might be hidden    Patient Care Team: Dorothyann Peng, NP as PCP - General (Family Medicine)    Assessment:   This is a routine wellness examination for [place].  Exercise Activities and Dietary recommendations Current Exercise Habits: Home exercise routine, Intensity: Moderate  Goals    . Patient Stated     Would visit [place] more and visit the kids        Fall Risk Fall Risk  09/26/2017 04/11/2017 07/07/2015 05/01/2014  Falls in the past year? Yes No No No  Number falls in past yr: 2 or more - - -  Comment 2 - - -  Injury with Fall? No - - -  Comment fell from ladder  - - -  Follow up Education provided - - -    Depression Screen PHQ 2/9 Scores 09/26/2017 04/11/2017 07/07/2015 05/01/2014  PHQ - 2 Score 2 2 0 0  PHQ- 9 Score 6 8 - -    Has medicine has helped her depression She lives again; laughs and she lives again [place] to get more active Thinking joining a grief group   Cognitive Function MMSE - Mini Mental State Exam 09/26/2017  Not completed: (No Data)     Ad8 score reviewed for issues:  Issues making decisions:  Less interest in hobbies / activities:  Repeats questions, stories (family complaining):  Trouble using ordinary gadgets (microwave, computer, phone):  Forgets the month or year:   Mismanaging finances:   Remembering appts:  Daily problems with thinking and/or memory: Ad8 score is=0        Immunization History  Administered Date(s) Administered  . Influenza, High Dose Seasonal PF 03/15/2017  . Pneumococcal Conjugate-13 10/07/2014  . Pneumococcal Polysaccharide-23 08/01/2012  . Tetanus 08/01/2012  . Zoster 12/03/2015   Screening  Tests Health Maintenance  Topic Date Due  . OPHTHALMOLOGY EXAM  07/29/2015  . INFLUENZA VACCINE  10/18/2017  . HEMOGLOBIN A1C  12/13/2017  . FOOT EXAM  04/11/2018  . COLONOSCOPY  07/05/2018  . MAMMOGRAM  06/09/2019  . TETANUS/TDAP  08/02/2022  . DEXA SCAN  Completed  . Hepatitis C Screening  Completed  . PNA vac Low Risk Adult  Completed        Plan:      PCP Notes   Health Maintenance Former smoker with quit date 2011  30 + pack history and referred to [facility] Maintenance Due  Topic Date Due  . OPHTHALMOLOGY EXAM  07/29/2015   Will schedule her eye exam  Will have diabetic eye check when she goes   Mammogram 3 / 2019 - Bone density  08/2015  Looks normal    Abnormal Screens  As noted   Referrals  Grief counseling considered Starting to get better  Stated the medicine helped her and she is starting to live again  Patient concerns; As noted; still working to work through her grief   Nurse Concerns; As noted   Next PCP apt  10/02/2018        I have personally reviewed and noted the following in the patient's chart:   . Medical and social history . Use of alcohol, tobacco or illicit drugs  . Current medications and supplements . Functional ability and status . Nutritional status . Physical activity . Advanced directives . List of other physicians . Hospitalizations, surgeries, and ER visits in previous 12 months . Vitals . Screenings to include cognitive, depression, and falls . Referrals  and appointments  In addition, I have reviewed and discussed with patient certain preventive protocols, quality metrics, and best practice recommendations. A written personalized care plan for preventive services as well as general preventive health recommendations were provided to patient.     Wynetta Fines, RN  09/26/2017

## 2017-09-26 ENCOUNTER — Ambulatory Visit (INDEPENDENT_AMBULATORY_CARE_PROVIDER_SITE_OTHER): Payer: Medicare HMO

## 2017-09-26 VITALS — BP 118/70 | HR 64 | Ht 64.5 in | Wt 200.0 lb

## 2017-09-26 DIAGNOSIS — Z87891 Personal history of nicotine dependence: Secondary | ICD-10-CM

## 2017-09-26 DIAGNOSIS — Z0000 Encounter for general adult medical examination without abnormal findings: Secondary | ICD-10-CM

## 2017-09-26 NOTE — Patient Instructions (Addendum)
[name] , Thank you for taking time to come for your Medicare Wellness Visit. I appreciate your ongoing commitment to your health goals. Please review the following plan we discussed and let me know if I can assist you in the future.    Will refer to the LDCT program headed up by a Nurse Practitioner; Anselm Lis   Has not had an eye exam;   Ask your eye doctor if they can check your eyes for diabetic retinopathy  Shingrix is a vaccine for the prevention of Shingles in Adults 50 and older.  If you are on Medicare, the shingrix is covered under your Part D plan, so you will take both of the vaccines in the series at your pharmacy. Please check with your benefits regarding applicable copays or out of pocket expenses.  The Shingrix is given in 2 vaccines approx 8 weeks apart. You must receive the 2nd dose prior to 6 months from receipt of the first. Please have the pharmacist print out you Immunization  dates for our office records       These are the goals we discussed: Goals    . Patient Stated     Would visit [place] more and visit the kids        This is a list of the screening recommended for you and due dates:  Health Maintenance  Topic Date Due  . Eye exam for diabetics  07/29/2015  . Flu Shot  10/18/2017  . Hemoglobin A1C  12/13/2017  . Complete foot exam   04/11/2018  . Colon Cancer Screening  07/05/2018  . Mammogram  06/09/2019  . Tetanus Vaccine  08/02/2022  . DEXA scan (bone density measurement)  Completed  .  Hepatitis C: One time screening is recommended by Center for Disease Control  (CDC) for  adults born from [birth year] through 1965.   Completed  . Pneumonia vaccines  Completed      Fall Prevention in the Home Falls can cause injuries. They can happen to people of all ages. There are many things you can do to make your home safe and to help prevent falls. What can I do on the outside of my home?  Regularly fix the edges of walkways and driveways and fix any  cracks.  Remove anything that might make you trip as you walk through a door, such as a raised step or threshold.  Trim any bushes or trees on the path to your home.  Use bright outdoor lighting.  Clear any walking paths of anything that might make someone trip, such as rocks or tools.  Regularly check to see if handrails are loose or broken. Make sure that both sides of any steps have handrails.  Any raised decks and porches should have guardrails on the edges.  Have any leaves, snow, or ice cleared regularly.  Use sand or salt on walking paths during winter.  Clean up any spills in your garage right away. This includes oil or grease spills. What can I do in the bathroom?  Use night lights.  Install grab bars by the toilet and in the tub and shower. Do not use towel bars as grab bars.  Use non-skid mats or decals in the tub or shower.  If you need to sit down in the shower, use a plastic, non-slip stool.  Keep the floor dry. Clean up any water that spills on the floor as soon as it happens.  Remove soap buildup in the tub or shower  regularly.  Attach bath mats securely with double-sided non-slip rug tape.  Do not have throw rugs and other things on the floor that can make you trip. What can I do in the bedroom?  Use night lights.  Make sure that you have a light by your bed that is easy to reach.  Do not use any sheets or blankets that are too big for your bed. They should not hang down onto the floor.  Have a firm chair that has side arms. You can use this for support while you get dressed.  Do not have throw rugs and other things on the floor that can make you trip. What can I do in the kitchen?  Clean up any spills right away.  Avoid walking on wet floors.  Keep items that you use a lot in easy-to-reach places.  If you need to reach something above you, use a strong step stool that has a grab bar.  Keep electrical cords out of the way.  Do not use floor  polish or wax that makes floors slippery. If you must use wax, use non-skid floor wax.  Do not have throw rugs and other things on the floor that can make you trip. What can I do with my stairs?  Do not leave any items on the stairs.  Make sure that there are handrails on both sides of the stairs and use them. Fix handrails that are broken or loose. Make sure that handrails are as long as the stairways.  Check any carpeting to make sure that it is firmly attached to the stairs. Fix any carpet that is loose or worn.  Avoid having throw rugs at the top or bottom of the stairs. If you do have throw rugs, attach them to the floor with carpet tape.  Make sure that you have a light switch at the top of the stairs and the bottom of the stairs. If you do not have them, ask someone to add them for you. What else can I do to help prevent falls?  Wear shoes that: ? Do not have high heels. ? Have rubber bottoms. ? Are comfortable and fit you well. ? Are closed at the toe. Do not wear sandals.  If you use a stepladder: ? Make sure that it is fully opened. Do not climb a closed stepladder. ? Make sure that both sides of the stepladder are locked into place. ? Ask someone to hold it for you, if possible.  Clearly mark and make sure that you can see: ? Any grab bars or handrails. ? First and last steps. ? Where the edge of each step is.  Use tools that help you move around (mobility aids) if they are needed. These include: ? Canes. ? Walkers. ? Scooters. ? Crutches.  Turn on the lights when you go into a dark area. Replace any light bulbs as soon as they burn out.  Set up your furniture so you have a clear path. Avoid moving your furniture around.  If any of your floors are uneven, fix them.  If there are any pets around you, be aware of where they are.  Review your medicines with your doctor. Some medicines can make you feel dizzy. This can increase your chance of falling. Ask your  doctor what other things that you can do to help prevent falls. This information is not intended to replace advice given to you by your health care provider. Make sure you discuss any questions  you have with your health care provider. Document Released: 12/31/2008 Document Revised: 08/12/2015 Document Reviewed: 04/10/2014 Elsevier Interactive Patient Education  2018 [place] Maintenance for Postmenopausal Women Menopause is a normal process in which your reproductive ability comes to an end. This process happens gradually over a span of months to years, usually between the ages of 43 and 39. Menopause is complete when you have missed 12 consecutive menstrual periods. It is important to talk with your health care provider about some of the most common conditions that affect postmenopausal women, such as heart disease, cancer, and bone loss (osteoporosis). Adopting a healthy lifestyle and getting preventive care can help to promote your health and wellness. Those actions can also lower your chances of developing some of these common conditions. What should I know about menopause? During menopause, you may experience a number of symptoms, such as:  Moderate-to-severe hot flashes.  Night sweats.  Decrease in sex drive.  Mood swings.  Headaches.  Tiredness.  Irritability.  Memory problems.  Insomnia.  Choosing to treat or not to treat menopausal changes is an individual decision that you make with your health care provider. What should I know about hormone replacement therapy and supplements? Hormone therapy products are effective for treating symptoms that are associated with menopause, such as hot flashes and night sweats. Hormone replacement carries certain risks, especially as you become older. If you are thinking about using estrogen or estrogen with progestin treatments, discuss the benefits and risks with your health care provider. What should I know about heart  disease and stroke? Heart disease, heart attack, and stroke become more likely as you age. This may be due, in part, to the hormonal changes that your body experiences during menopause. These can affect how your body processes dietary fats, triglycerides, and cholesterol. Heart attack and stroke are both medical emergencies. There are many things that you can do to help prevent heart disease and stroke:  Have your blood pressure checked at least every 1-2 years. High blood pressure causes heart disease and increases the risk of stroke.  If you are 7-3 years old, ask your health care provider if you should take aspirin to prevent a heart attack or a stroke.  Do not use any tobacco products, including cigarettes, chewing tobacco, or electronic cigarettes. If you need help quitting, ask your health care provider.  It is important to eat a healthy diet and maintain a healthy weight. ? Be sure to include plenty of vegetables, fruits, low-fat dairy products, and lean protein. ? Avoid eating foods that are high in solid fats, added sugars, or salt (sodium).  Get regular exercise. This is one of the most important things that you can do for your health. ? Try to exercise for at least 150 minutes each week. The type of exercise that you do should increase your heart rate and make you sweat. This is known as moderate-intensity exercise. ? Try to do strengthening exercises at least twice each week. Do these in addition to the moderate-intensity exercise.  Know your numbers.Ask your health care provider to check your cholesterol and your blood glucose. Continue to have your blood tested as directed by your health care provider.  What should I know about cancer screening? There are several types of cancer. Take the following steps to reduce your risk and to catch any cancer development as early as possible. Breast Cancer  Practice breast self-awareness. ? This means understanding how your breasts  normally appear  and feel. ? It also means doing regular breast self-exams. Let your health care provider know about any changes, no matter how small.  If you are 45 or older, have a clinician do a breast exam (clinical breast exam or CBE) every year. Depending on your age, family history, and medical history, it may be recommended that you also have a yearly breast X-ray (mammogram).  If you have a family history of breast cancer, talk with your health care provider about genetic screening.  If you are at high risk for breast cancer, talk with your health care provider about having an MRI and a mammogram every year.  Breast cancer (BRCA) gene test is recommended for women who have family members with BRCA-related cancers. Results of the assessment will determine the need for genetic counseling and BRCA1 and for BRCA2 testing. BRCA-related cancers include these types: ? Breast. This occurs in males or females. ? Ovarian. ? Tubal. This may also be called fallopian tube cancer. ? Cancer of the abdominal or pelvic lining (peritoneal cancer). ? Prostate. ? Pancreatic.  Cervical, Uterine, and Ovarian Cancer Your health care provider may recommend that you be screened regularly for cancer of the pelvic organs. These include your ovaries, uterus, and vagina. This screening involves a pelvic exam, which includes checking for microscopic changes to the surface of your cervix (Pap test).  For women ages 21-65, health care providers may recommend a pelvic exam and a Pap test every three years. For women ages 75-65, they may recommend the Pap test and pelvic exam, combined with testing for human papilloma virus (HPV), every five years. Some types of HPV increase your risk of cervical cancer. Testing for HPV may also be done on women of any age who have unclear Pap test results.  Other health care providers may not recommend any screening for nonpregnant women who are considered low risk for pelvic cancer  and have no symptoms. Ask your health care provider if a screening pelvic exam is right for you.  If you have had past treatment for cervical cancer or a condition that could lead to cancer, you need Pap tests and screening for cancer for at least 20 years after your treatment. If Pap tests have been discontinued for you, your risk factors (such as having a new sexual partner) need to be reassessed to determine if you should start having screenings again. Some women have medical problems that increase the chance of getting cervical cancer. In these cases, your health care provider may recommend that you have screening and Pap tests more often.  If you have a family history of uterine cancer or ovarian cancer, talk with your health care provider about genetic screening.  If you have vaginal bleeding after reaching menopause, tell your health care provider.  There are currently no reliable tests available to screen for ovarian cancer.  Lung Cancer Lung cancer screening is recommended for adults 35-30 years old who are at high risk for lung cancer because of a history of smoking. A yearly low-dose CT scan of the lungs is recommended if you:  Currently smoke.  Have a history of at least 30 pack-years of smoking and you currently smoke or have quit within the past 15 years. A pack-year is smoking an average of one pack of cigarettes per day for one year.  Yearly screening should:  Continue until it has been 15 years since you quit.  Stop if you develop a health problem that would prevent you from having  lung cancer treatment.  Colorectal Cancer  This type of cancer can be detected and can often be prevented.  Routine colorectal cancer screening usually begins at age 64 and continues through age 49.  If you have risk factors for colon cancer, your health care provider may recommend that you be screened at an earlier age.  If you have a family history of colorectal cancer, talk with your  health care provider about genetic screening.  Your health care provider may also recommend using home test kits to check for hidden blood in your stool.  A small camera at the end of a tube can be used to examine your colon directly (sigmoidoscopy or colonoscopy). This is done to check for the earliest forms of colorectal cancer.  Direct examination of the colon should be repeated every 5-10 years until age 40. However, if early forms of precancerous polyps or small growths are found or if you have a family history or genetic risk for colorectal cancer, you may need to be screened more often.  Skin Cancer  Check your skin from head to toe regularly.  Monitor any moles. Be sure to tell your health care provider: ? About any new moles or changes in moles, especially if there is a change in a mole's shape or color. ? If you have a mole that is larger than the size of a pencil eraser.  If any of your family members has a history of skin cancer, especially at a young age, talk with your health care provider about genetic screening.  Always use sunscreen. Apply sunscreen liberally and repeatedly throughout the day.  Whenever you are outside, protect yourself by wearing long sleeves, pants, a wide-brimmed hat, and sunglasses.  What should I know about osteoporosis? Osteoporosis is a condition in which bone destruction happens more quickly than new bone creation. After menopause, you may be at an increased risk for osteoporosis. To help prevent osteoporosis or the bone fractures that can happen because of osteoporosis, the following is recommended:  If you are 6-23 years old, get at least 1,000 mg of calcium and at least 600 mg of vitamin D per day.  If you are older than age 15 but younger than age 25, get at least 1,200 mg of calcium and at least 600 mg of vitamin D per day.  If you are older than age 15, get at least 1,200 mg of calcium and at least 800 mg of vitamin D per day.  Smoking  and excessive alcohol intake increase the risk of osteoporosis. Eat foods that are rich in calcium and vitamin D, and do weight-bearing exercises several times each week as directed by your health care provider. What should I know about how menopause affects my mental health? Depression may occur at any age, but it is more common as you become older. Common symptoms of depression include:  Low or sad mood.  Changes in sleep patterns.  Changes in appetite or eating patterns.  Feeling an overall lack of motivation or enjoyment of activities that you previously enjoyed.  Frequent crying spells.  Talk with your health care provider if you think that you are experiencing depression. What should I know about immunizations? It is important that you get and maintain your immunizations. These include:  Tetanus, diphtheria, and pertussis (Tdap) booster vaccine.  Influenza every year before the flu season begins.  Pneumonia vaccine.  Shingles vaccine.  Your health care provider may also recommend other immunizations. This information is not intended  to replace advice given to you by your health care provider. Make sure you discuss any questions you have with your health care provider. Document Released: 04/28/2005 Document Revised: 09/24/2015 Document Reviewed: 12/08/2014 Elsevier Interactive Patient Education  2018 [employer].   Hearing Loss Hearing loss is a partial or total loss of the ability to hear. This can be temporary or permanent, and it can happen in one or both ears. Hearing loss may be referred to as deafness. Medical care is necessary to treat hearing loss properly and to prevent the condition from getting worse. Your hearing may partially or completely come back, depending on what caused your hearing loss and how severe it is. In some cases, hearing loss is permanent. What are the causes? Common causes of hearing loss include:  Too much wax in the ear canal.  Infection  of the ear canal or middle ear.  Fluid in the middle ear.  Injury to the ear or surrounding area.  An object stuck in the ear.  Prolonged exposure to loud sounds, such as music.  Less common causes of hearing loss include:  Tumors in the ear.  Viral or bacterial infections, such as meningitis.  A hole in the eardrum (perforated eardrum).  Problems with the hearing nerve that sends signals between the brain and the ear.  Certain medicines.  What are the signs or symptoms? Symptoms of this condition may include:  Difficulty telling the difference between sounds.  Difficulty following a conversation when there is background noise.  Lack of response to sounds in your environment. This may be most noticeable when you do not respond to startling sounds.  Needing to turn up the volume on the television, radio, etc.  Ringing in the ears.  Dizziness.  Pain in the ears.  How is this diagnosed? This condition is diagnosed based on a physical exam and a hearing test (audiometry). The audiometry test will be performed by a hearing specialist (audiologist). You may also be referred to an ear, nose, and throat (ENT) specialist (otolaryngologist). How is this treated? Treatment for recent onset of hearing loss may include:  Ear wax removal.  Being prescribed medicines to prevent infection (antibiotics).  Being prescribed medicines to reduce inflammation (corticosteroids).  Follow these instructions at home:  If you were prescribed an antibiotic medicine, take it as told by your health care provider. Do not stop taking the antibiotic even if you start to feel better.  Take over-the-counter and prescription medicines only as told by your health care provider.  Avoid loud noises.  Return to your normal activities as told by your health care provider. Ask your health care provider what activities are safe for you.  Keep all follow-up visits as told by your health care  provider. This is important. Contact a health care provider if:  You feel dizzy.  You develop new symptoms.  You vomit or feel nauseous.  You have a fever. Get help right away if:  You develop sudden changes in your vision.  You have severe ear pain.  You have new or increased weakness.  You have a severe headache. This information is not intended to replace advice given to you by your health care provider. Make sure you discuss any questions you have with your health care provider. Document Released: 03/06/2005 Document Revised: 08/12/2015 Document Reviewed: 07/22/2014 Elsevier Interactive Patient Education  2018 [employer].

## 2017-09-26 NOTE — Progress Notes (Signed)
I have reviewed documentation for AWV and Advance Care Planning provided by the health coach and agree with documentation. I was immediately available for questions.  

## 2017-09-27 ENCOUNTER — Other Ambulatory Visit: Payer: Self-pay | Admitting: Acute Care

## 2017-09-27 DIAGNOSIS — Z122 Encounter for screening for malignant neoplasm of respiratory organs: Secondary | ICD-10-CM

## 2017-09-27 DIAGNOSIS — Z87891 Personal history of nicotine dependence: Secondary | ICD-10-CM

## 2017-10-05 ENCOUNTER — Encounter: Payer: Self-pay | Admitting: Acute Care

## 2017-10-05 ENCOUNTER — Ambulatory Visit (INDEPENDENT_AMBULATORY_CARE_PROVIDER_SITE_OTHER)
Admission: RE | Admit: 2017-10-05 | Discharge: 2017-10-05 | Disposition: A | Discharge status: Home / Self Care | Payer: Medicare HMO | Source: Ambulatory Visit | Attending: Acute Care | Admitting: Acute Care

## 2017-10-05 ENCOUNTER — Ambulatory Visit (INDEPENDENT_AMBULATORY_CARE_PROVIDER_SITE_OTHER): Payer: Medicare HMO | Admitting: Acute Care

## 2017-10-05 DIAGNOSIS — Z87891 Personal history of nicotine dependence: Secondary | ICD-10-CM | POA: Diagnosis not present

## 2017-10-05 DIAGNOSIS — Z122 Encounter for screening for malignant neoplasm of respiratory organs: Secondary | ICD-10-CM

## 2017-10-05 NOTE — Progress Notes (Signed)
Shared Decision Making Visit Lung Cancer Screening Program [phone])   Eligibility:  Age 72 y.o.  Pack Years Smoking History Calculation 70 pack year smoking history (# packs/per year x # years smoked)  Recent History of coughing up blood  no  Unexplained weight loss? no ( >Than 15 pounds within the last 6 months )  Prior History Lung / other cancer no (Diagnosis within the last 5 years already requiring surveillance chest CT Scans).  Smoking Status Former Smoker  Former Smokers: Years since quit: 10 years  Quit Date: 07/04/2009  Visit Components:  Discussion included one or more decision making aids. yes  Discussion included risk/benefits of screening. yes  Discussion included potential follow up diagnostic testing for abnormal scans. yes  Discussion included meaning and risk of over diagnosis. yes  Discussion included meaning and risk of False Positives. yes  Discussion included meaning of total radiation exposure. yes  Counseling Included:  Importance of adherence to annual lung cancer LDCT screening. yes  Impact of comorbidities on ability to participate in the program. yes  Ability and willingness to under diagnostic treatment. yes  Smoking Cessation Counseling:  Current Smokers:   Discussed importance of smoking cessation. NA, former smoker  Information about tobacco cessation classes and interventions provided to patient. yes  Patient provided with "ticket" for LDCT Scan. yes  Symptomatic Patient. no  Counseling NA  Diagnosis Code: Tobacco Use Z72.0  Asymptomatic Patient yes  Counseling (Intermediate counseling: > three minutes counseling) [id number]  Former Smokers:   Discussed the importance of maintaining cigarette abstinence. yes  Diagnosis Code: Personal History of Nicotine Dependence. B35.329  Information about tobacco cessation classes and interventions provided to patient. Yes  Patient provided with "ticket" for LDCT Scan. yes  Written  Order for Lung Cancer Screening with LDCT placed in Epic. Yes (CT Chest Lung Cancer Screening Low Dose W/O CM) [id number] Z12.2-Screening of respiratory organs Z87.891-Personal history of nicotine dependence  I spent 25 minutes of face to face time with Kara Huynh discussing the risks and benefits of lung cancer screening. We viewed a power point together that explained in detail the above noted topics. We took the time to pause the power point at intervals to allow for questions to be asked and answered to ensure understanding. We discussed that she had taken the single most powerful action possible to decrease her risk of developing lung cancer when she quit smoking. I counseled her to remain smoke free, and to contact me if she ever had the desire to smoke again so that I can provide resources and tools to help support the effort to remain smoke free. We discussed the time and location of the scan, and that either  Doroteo Glassman RN or I will call with the results within  24-48 hours of receiving them. She has my card and contact information in the event she needs to speak with me, in addition to a copy of the power point we reviewed as a resource. she verbalized understanding of all of the above and had no further questions upon leaving the office.     I explained to the patient that there has been a high incidence of coronary artery disease noted on these exams. I explained that this is a non-gated exam therefore degree or severity cannot be determined. This patient is currently on statin therapy. I have asked the patient to follow-up with their PCP regarding any incidental finding of coronary artery disease and management with diet or medication as they  feel is clinically indicated. The patient verbalized understanding of the above and had no further questions.      Kara Spatz, NP 10/05/2017 3:04 PM

## 2017-10-08 ENCOUNTER — Telehealth: Payer: Self-pay | Admitting: Acute Care

## 2017-10-08 NOTE — Telephone Encounter (Signed)
Call report received from [facility].  CT Chest lung caner screening, 10/05/17, showed that there is an 8.1 mm nodule in the posterior aspect of left upper lobe. Ground-glass nodules measure 9.5 mm or less in size.  Will route to Eric Form, NP

## 2017-10-09 ENCOUNTER — Other Ambulatory Visit: Payer: Self-pay | Admitting: Acute Care

## 2017-10-09 DIAGNOSIS — Z122 Encounter for screening for malignant neoplasm of respiratory organs: Secondary | ICD-10-CM

## 2017-10-09 DIAGNOSIS — Z87891 Personal history of nicotine dependence: Secondary | ICD-10-CM

## 2018-01-16 ENCOUNTER — Ambulatory Visit (INDEPENDENT_AMBULATORY_CARE_PROVIDER_SITE_OTHER)
Admission: RE | Admit: 2018-01-16 | Discharge: 2018-01-16 | Disposition: A | Discharge status: Home / Self Care | Payer: Medicare HMO | Source: Ambulatory Visit | Attending: Acute Care | Admitting: Acute Care

## 2018-01-16 DIAGNOSIS — J439 Emphysema, unspecified: Secondary | ICD-10-CM | POA: Diagnosis not present

## 2018-01-16 DIAGNOSIS — Z122 Encounter for screening for malignant neoplasm of respiratory organs: Secondary | ICD-10-CM

## 2018-01-16 DIAGNOSIS — R911 Solitary pulmonary nodule: Secondary | ICD-10-CM | POA: Diagnosis not present

## 2018-01-16 DIAGNOSIS — Z87891 Personal history of nicotine dependence: Secondary | ICD-10-CM

## 2018-01-18 ENCOUNTER — Telehealth: Payer: Self-pay | Admitting: Acute Care

## 2018-01-18 NOTE — Progress Notes (Signed)
Called spoke with patient, advised of LDCT results / recs as stated by Judson Roch NP.  Pt verbalized understanding and denied any questions.  Langley Gauss to place order for 12 month LD CT for 12/2018.  Results faxed to PCP.

## 2018-01-18 NOTE — Telephone Encounter (Signed)
Patient advised of LD CT results See result note to the 10.30.19 LD CT Will sign off

## 2018-01-21 ENCOUNTER — Other Ambulatory Visit: Payer: Self-pay | Admitting: Acute Care

## 2018-01-21 DIAGNOSIS — Z122 Encounter for screening for malignant neoplasm of respiratory organs: Secondary | ICD-10-CM

## 2018-01-21 DIAGNOSIS — Z87891 Personal history of nicotine dependence: Secondary | ICD-10-CM

## 2018-01-22 ENCOUNTER — Telehealth: Payer: Self-pay | Admitting: Adult Health

## 2018-01-22 ENCOUNTER — Other Ambulatory Visit: Payer: Self-pay | Admitting: Adult Health

## 2018-01-22 DIAGNOSIS — R918 Other nonspecific abnormal finding of lung field: Secondary | ICD-10-CM

## 2018-01-22 NOTE — Telephone Encounter (Signed)
Copied from [place] [phone]. Topic: Quick Communication - See Telephone Encounter >> Jan 22, 2018  4:23 PM Vernona Rieger wrote: CRM for notification. See Telephone encounter for: 01/22/18.  Patient states whoever gave her the results of her CT of the spot on her lung that everything looked good, but was told that she may have a blockage. She said she does not know anything else or who to call. She is wanting to know if Tommi Rumps is aware of this. Please Advise.

## 2018-01-22 NOTE — Telephone Encounter (Signed)
Looking over her CT scan she does have plaque buildup in three vessels. I would like to refer her to cardiology for further evaluation. Have her start taking 81 mg ASA daily

## 2018-01-23 NOTE — Telephone Encounter (Signed)
Left message on machine for patient to return our call CRM 

## 2018-01-23 NOTE — Telephone Encounter (Signed)
Left message on machine for patient to return our call 

## 2018-01-24 NOTE — Telephone Encounter (Signed)
Patient is aware and referral placed 

## 2018-02-04 ENCOUNTER — Ambulatory Visit: Payer: Medicare HMO | Admitting: Cardiology

## 2018-02-04 ENCOUNTER — Encounter: Payer: Self-pay | Admitting: Cardiology

## 2018-02-04 VITALS — BP 136/66 | HR 66 | Ht 65.0 in | Wt 197.8 lb

## 2018-02-04 DIAGNOSIS — I2510 Atherosclerotic heart disease of native coronary artery without angina pectoris: Secondary | ICD-10-CM | POA: Diagnosis not present

## 2018-02-04 DIAGNOSIS — Z7189 Other specified counseling: Secondary | ICD-10-CM

## 2018-02-04 DIAGNOSIS — E119 Type 2 diabetes mellitus without complications: Secondary | ICD-10-CM

## 2018-02-04 DIAGNOSIS — E782 Mixed hyperlipidemia: Secondary | ICD-10-CM | POA: Diagnosis not present

## 2018-02-04 NOTE — Progress Notes (Signed)
Cardiology Office Note:    Date:  02/04/2018   ID:  Kara Huynh, Kara Huynh [birth year], MRN [id number]  PCP:  Dorothyann Peng, NP  Cardiologist:  Buford Dresser, MD PhD  Referring MD: Dorothyann Peng, NP   CC: new evaluation  History of Present Illness:    Kara Huynh is a 72 y.o. female with a hx of hypertension, hypothyroidism who is seen as a new consult at the request of Nafziger, Tommi Rumps, NP for the evaluation and management of coronary artery calcification.  Risk factor evaluation -no personal cardiac history. No prior cardiac testing -comorbidities: was on medications for diabetes, now diet controlled. Has hypertension, hyperlipidemia. -FH: father had heart issues in later adulthood. No other history of heart disease or strokes -SH: 2 ppd for 30-40 years, quit 20 years ago. Very rare alcohol. Husband passed away 2.5 years ago (had leukemia). Trying to reconnect and stay socially involved. Cleans house, shops, cooks. Does visit neighborhood friends. Looking to volunteer and get involved. Cut out sugar and carbs, losing weight.  Has not had cholesterol rechecked since restarting atorvastatin.  Has muscle cramps. Has had some in the left back/midline to lateral area, some left arm cramps occasionally. She notes that she is doing all of the activities and maintenance around the house now she lives alone. No syncope. No chest heaviness. No shortness of breath. Occasional mechanical falls, better when she uses her cane. No PND, orthopnea, LE edema, syncope, palpitations.   We spent significant time today talking about prevention, lifestyle recommendations, and risk factors as noted below.  Past Medical History:  Diagnosis Date  . Anxiety   . Arthritis   . Cancer ([place])    uterine  . Depression   . Endometrial polyp 02-17-13   11'14 Dx. endometrial cancer  . Hyperlipidemia   . Hypertension   . Hypothyroidism   . Type 2 diabetes mellitus ([place])     Past Surgical History:    Procedure Laterality Date  . APPENDECTOMY    . BREAST SURGERY     biopsy  . CATARACT EXTRACTION, BILATERAL Bilateral 02-17-13   bilateral  . DILITATION & CURRETTAGE/HYSTROSCOPY WITH VERSAPOINT RESECTION N/A 01/31/2013   Procedure: DILATATION & CURETTAGE/HYSTEROSCOPY WITH CERVICAL BLOCK;  Surgeon: Marylynn Pearson, MD;  Location: [facility];  Service: Gynecology;  Laterality: N/A;  . LAPAROSCOPY FOR ECTOPIC PREGNANCY  1977   SALPINGOSTOMY (SIDE UNKNOWN)  . LAPAROTOMY Bilateral 02/18/2013   Procedure: EXPLORATORY LAPAROTOMY TOTAL ABDMONIAL HYSTERECTOMY BILATERAL SALPINGO OOPHORECTOMY ;  Surgeon: Alvino Chapel, MD;  Location: [facility];  Service: Gynecology;  Laterality: Bilateral;  . TOTAL ABDOMINAL HYSTERECTOMY W/ BILATERAL SALPINGOOPHORECTOMY    . TOTAL KNEE ARTHROPLASTY Bilateral     Current Medications: Current Outpatient Medications on File Prior to Visit  Medication Sig  . aspirin EC 81 MG tablet Take 81 mg by mouth daily.  Marland Kitchen atorvastatin (LIPITOR) 20 MG tablet Take 1 tablet (20 mg total) by mouth daily.  Marland Kitchen FLUoxetine (PROZAC) 40 MG capsule TAKE 1 CAPSULE BY MOUTH EVERY DAY  . levothyroxine (SYNTHROID, LEVOTHROID) 25 MCG tablet TAKE 1 TABLET EVERY MORNING AS DIRECTED  . lisinopril (PRINIVIL,ZESTRIL) 10 MG tablet TAKE 1 TABLET BY MOUTH EVERY DAY   No current facility-administered medications on file prior to visit.      Allergies:   Patient has no known allergies.   Social History   Socioeconomic History  . Marital status: Widowed    Spouse name: Not on file  . Number of children: Not on file  .  Years of education: Not on file  . Highest education level: Not on file  Occupational History  . Not on file  Social Needs  . Financial resource strain: Not on file  . Food insecurity:    Worry: Not on file    Inability: Not on file  . Transportation needs:    Medical: Not on file    Non-medical: Not on file  Tobacco Use  . Smoking status: Former Smoker    Packs/day:  1.50    Years: 47.00    Pack years: 70.50    Last attempt to quit: 07/04/2009    Years since quitting: 8.5  . Smokeless tobacco: Never Used  . Tobacco comment: quit a long time ago  Substance and Sexual Activity  . Alcohol use: Yes    Comment: twice monthly; had her 1st glass of wine in 2 year   . Drug use: No  . Sexual activity: Yes    Partners: Male    Birth control/protection: Post-menopausal  Lifestyle  . Physical activity:    Days per week: Not on file    Minutes per session: Not on file  . Stress: Not on file  Relationships  . Social connections:    Talks on phone: Not on file    Gets together: Not on file    Attends religious service: Not on file    Active member of club or organization: Not on file    Attends meetings of clubs or organizations: Not on file    Relationship status: Not on file  Other Topics Concern  . Not on file  Social History Narrative   Retired from being an Web designer   Married for 35 years   3 step children and 1 adopted child. They live all over the [name]     Family History: The patient's family history includes Arthritis in her father and mother; Cancer in her father; Colitis in her father; Diabetes in her mother; Heart disease in her father; Hyperlipidemia in her father; Hypertension in her father; Mental illness in her mother; Stroke in her father. There is no history of Esophageal cancer, Rectal cancer, or Stomach cancer.  ROS:   Please see the history of present illness.  Additional pertinent ROS:  Constitutional: Negative for chills, fever, night sweats, unintentional weight loss  HENT: Negative for ear pain and hearing loss.   Eyes: Negative for loss of vision and eye pain.  Respiratory: Negative for cough, sputum, shortness of breath, wheezing.   Cardiovascular: Negative for chest pain, palpitations, PND, orthopnea, lower extremity edema and claudication.  Gastrointestinal: Negative for abdominal pain, melena, and  hematochezia.  Genitourinary: Negative for dysuria and hematuria.  Musculoskeletal: Negative for falls and myalgias.  Skin: Negative for itching and rash.  Neurological: Negative for focal weakness, focal sensory changes and loss of consciousness.  Endo/Heme/Allergies: Does not bruise/bleed easily.    EKGs/Labs/Other Studies Reviewed:    The following studies were reviewed today:  CT scan (abridged) No evidence of thoracic aortic aneurysm. Atherosclerotic calcifications of the aortic arch.  Three vessel coronary atherosclerosis.  EKG:  EKG is personally reviewed.  The ekg ordered today demonstrates sinus rhythm, short PR.  Recent Labs: 04/11/2017: ALT 13; BUN 18; Creatinine, Ser 1.25; Hemoglobin 12.9; Platelets 227.0; Potassium 4.0; Sodium 142; TSH 1.38  Recent Lipid Panel    Component Value Date/Time   CHOL 280 (H) 04/11/2017 1439   TRIG 226.0 (H) 04/11/2017 1439   HDL 52.30 04/11/2017 1439   CHOLHDL 5  04/11/2017 1439   VLDL 45.2 (H) 04/11/2017 1439   LDLCALC 89 07/07/2015 0922   LDLDIRECT 185.0 04/11/2017 1439    Physical Exam:    VS:  BP 136/66 (BP Location: Right Arm, Patient Position: Sitting, Cuff Size: Normal)   Pulse 66   Ht 5\' 5"  (1.651 m)   Wt 197 lb 12.8 oz (89.7 kg)   BMI 32.92 kg/m     Wt Readings from Last 3 Encounters:  02/04/18 197 lb 12.8 oz (89.7 kg)  09/26/17 200 lb (90.7 kg)  06/12/17 209 lb (94.8 kg)     GEN: Well nourished, well developed in no acute distress HEENT: Normal NECK: No JVD; No carotid bruits LYMPHATICS: No lymphadenopathy CARDIAC: regular rhythm, normal S1 and S2, no murmurs, rubs, gallops. Radial and DP pulses 2+ bilaterally. RESPIRATORY:  Clear to auscultation without rales, wheezing or rhonchi  ABDOMEN: Soft, non-tender, non-distended MUSCULOSKELETAL:  No edema; No deformity  SKIN: Warm and dry NEUROLOGIC:  Alert and oriented x 3 PSYCHIATRIC:  Normal affect   ASSESSMENT:    1. Coronary artery disease involving native  heart without angina pectoris, unspecified vessel or lesion type   2. Counseling on health promotion and disease prevention   3. Controlled type 2 diabetes mellitus without complication, without long-term current use of insulin ([place])   4. Mixed hyperlipidemia    PLAN:    1. Coronary artery calcification-> new diagnosis of CAD without angina: Prevention and health counseling: -recommend heart healthy/Mediterranean diet, with whole grains, fruits, vegetable, fish, lean meats, nuts, and olive oil. Limit salt. -recommend moderate walking, 3-5 times/week for 30-50 minutes each session. Aim for at least 150 minutes.week. Goal should be pace of 3 miles/hours, or walking 1.5 miles in 30 minutes -recommend avoidance of tobacco products. Avoid excess alcohol. -Additional risk factor control:  -Diabetes: A1c is 5.7, now her type II diabetes is not requiring medications  -Lipids: mixed hyperlipidemia, with goal <70 with CAD. Last LDL was 185 prior to restarting atorvastatin 20 mg. Recheck lipids-->LDL 63, at goal. TG also down to 120. Continue atorvastatin  -Blood pressure control: goal <130/80 with diabetes and CAD. On lisinopril. Wants to work on lifestyle, but if not at goal consider increasing lisinopril  -Weight: BMI 32.9. Working on lifestyle changes -ASCVD risk score: technically now secondary prevention, but demonstrates increased risk of ASCVD The 10-year ASCVD risk score Mikey Bussing DC Jr., et al., 2013) is: 38.4%   Values used to calculate the score:     Age: 38 years     Sex: Female     Is Non-Hispanic African American: No     Diabetic: Yes     Tobacco smoker: No     Systolic Blood Pressure: 093 mmHg     Is BP treated: Yes     HDL Cholesterol: 52.3 mg/dL     Total Cholesterol: 280 mg/dL  -is on aspirin 81 mg  Plan for follow up: 6 mos  Medication Adjustments/Labs and Tests Ordered: Current medicines are reviewed at length with the patient today.  Concerns regarding medicines are outlined  above.  Orders Placed This Encounter  Procedures  . Lipid panel  . EKG 12-Lead   No orders of the defined types were placed in this encounter.   Patient Instructions  Medication Instructions:  Your Physician recommend you continue on your current medication as directed.    If you need a refill on your cardiac medications before your next appointment, please call your pharmacy.   Lab work: Your  physician recommends that you return for lab work in today (lipid)  If you have labs (blood work) drawn today and your tests are completely normal, you will receive your results only by: Marland Kitchen MyChart Message (if you have MyChart) OR . A paper copy in the mail If you have any lab test that is abnormal or we need to change your treatment, we will call you to review the results.  Testing/Procedures: None  Follow-Up: At [facility], you and your health needs are our priority.  As part of our continuing mission to provide you with exceptional heart care, we have created designated Provider Care Teams.  These Care Teams include your primary Cardiologist (physician) and Advanced Practice Providers (APPs -  Physician Assistants and Nurse Practitioners) who all work together to provide you with the care you need, when you need it. You will need a follow up appointment in 6 months.  Please call our office 2 months in advance to schedule this appointment.  You may see Dr.Berneta Sconyers or one of the following Advanced Practice Providers on your designated Care Team:   Rosaria Ferries, PA-C . Jory Sims, DNP, ANP  Any Other Special Instructions Will Be Listed Below (If Applicable)   Atherosclerosis Atherosclerosis is narrowing and hardening of the blood vessels (arteries). Arteries are tubes that carry blood that contains oxygen from the heart to all parts of the body. Arteries can become narrow or clogged with a buildup of fat, cholesterol, calcium, or other substances (plaque). Plaque decreases the  amount of blood that can flow through the artery. Atherosclerosis can affect any artery in the body, including:  Heart arteries (coronary artery disease), which may cause heart attack.  Brain arteries, which may cause stroke.  Leg, arm, and pelvis arteries (peripheral artery disease), which may cause pain and numbness.  Kidney arteries, which may cause kidney (renal) failure.  Treatment may slow the disease and prevent further damage to the heart, brain, peripheral arteries, and kidneys. What are the causes? Atherosclerosis develops when plaque forms in an artery. This damages the inside wall of the artery. Over time, the plaque grows and hardens. It may break through the artery wall. This causes a blood clot to form over the break, which narrows the artery more. The clot may also break loose and travel to other arteries, causing more damage. What increases the risk? This condition is more likely to develop in people who:  Are middle age or older.  Have a family history of atherosclerosis.  Have high cholesterol.  Have high blood fats (triglycerides).  Have diabetes.  Are overweight.  Smoke tobacco.  Do not exercise enough.  Have a substance in the blood that indicates increased levels of inflammation in the body (C-reactive protein, or CRP).  Have sleep apnea.  Are stressed.  Drink too much alcohol.  What are the signs or symptoms? This condition may not cause any symptoms. If you do have symptoms, they are caused by damage to an area of your body that is not getting enough blood. The following symptoms are possible:  Coronary artery disease may cause chest pain and shortness of breath.  Decreased blood supply to your brain may cause a stroke. Signs and symptoms of stroke may include sudden: ? Weakness on one side of the body. ? Confusion. ? Changes in vision. ? Inability to speak or understand speech. ? Loss of balance, coordination, or ability to walk. ? Severe  headache. ? Loss of consciousness.  Peripheral artery disease may cause  pain and numbness, often in the legs and hips.  Renal failure may cause fatigue, nausea, swelling, and itchy skin.  How is this diagnosed? This condition is diagnosed based on your medical history and a physical exam. During the exam, your health care provider will check your pulses and listen for a "whooshing" sound over your arteries (bruit). You may have tests, such as:  Blood tests to check your levels of cholesterol, triglycerides, and CRP.  Electrocardiogram (ECG) to check for heart damage.  Chest X-ray to see if your heart is enlarged, which is a sign of heart failure.  Stress test to see how your heart reacts to exercise.  Echocardiogram to get images of the inside of your heart.  Ankle-Brachial index to compare blood pressure in your arms to blood pressure in your ankles.  Ultrasound of your peripheral arteries to check blood flow.  CT scan to check for damage to your heart or brain.  X-rays of blood vessels after dye has been injected (angiogram) to check blood flow.  How is this treated? Treatment starts with lifestyle changes, which may include:  Changing your diet.  Losing weight.  Reducing stress.  Exercising and being more physically active.  Not smoking.  You also may need medicine to:  Lower triglycerides and cholesterol.  Lower and control blood pressure.  Prevent blood clots.  Lower inflammation in your body.  Lower and control your blood sugar.  Sometimes, surgery is needed to remove plaque, widen your artery, or create a new path for your blood (bypass). Surgical treatment may include:  Removing plaque from an artery (endarterectomy).  Opening a narrowed heart artery (angioplasty).  Heart or peripheral artery bypass graft surgery.  Follow these instructions at home: Lifestyle   Eat a heart-healthy diet. Talk to your health care provider or a diet specialist  (dietitian) if you need help. A heart-healthy diet includes: ? Limiting unhealthy fats and increasing healthy fats. Some examples of healthy fats are olive oil and canola oil. ? Eating plant-based foods, such as fruits, vegetables, nuts, legumes, and whole grains.  Follow an exercise program as told by your health care provider.  Maintain a healthy weight. Lose weight if directed by your health care provider.  Rest when you are tired.  Learn to manage your stress.  Do not use any tobacco products, such as cigarettes, chewing tobacco, and e-cigarettes. If you need help quitting, ask your health care provider.  Limit alcohol intake to no more than 1 drink a day for nonpregnant women and 2 drinks a day for men. One drink equals 12 oz of beer, 5 oz of wine, or 1 oz of hard liquor.  Do not abuse drugs. General instructions  Take over-the-counter and prescription medicines only as told by your health care provider.  Manage other health conditions as told by your health care provider.  Keep all follow-up visits as told by your health care provider. This is important. Contact a health care provider if:  You have chest pain or discomfort. This includes squeezing chest pain that may feel like indigestion (angina).  You have shortness of breath.  You have an irregular heartbeat.  You have unexplained fatigue.  You have unexplained pain or numbness in an arm, leg, or hip.  You have nausea, swelling of your hands or feet, and itchy skin. Get help right away if:  You have symptoms of a heart attack, such as: ? Chest pain. ? Shortness of breath. ? Pain in your neck, jaw,  arms, back, or stomach. ? Cold sweat. ? Nausea. ? Light-headedness.  You have symptoms of a stroke, such as sudden: ? Weakness on one side of your body. ? Confusion. ? Changes in vision. ? Inability to speak or understand speech. ? Loss of balance, coordination, or ability to walk. ? Severe headache. ? Loss of  consciousness. These symptoms may represent a serious problem that is an emergency. Do not wait to see if the symptoms will go away. Get medical help right away. Call your local emergency services (911 in the [place] Do not drive yourself to the hospital. This information is not intended to replace advice given to you by your health care provider. Make sure you discuss any questions you have with your health care provider. Document Released: 05/27/2003 Document Revised: 08/12/2015 Document Reviewed: 01/25/2015 Elsevier Interactive Patient Education  2018 [place] refers to food and lifestyle choices that are based on the traditions of countries located on the [employer]. This way of eating has been shown to help prevent certain conditions and improve outcomes for people who have chronic diseases, like kidney disease and heart disease. What are tips for following this plan? Lifestyle  Cook and eat meals together with your family, when possible.  Drink enough fluid to keep your urine clear or pale yellow.  Be physically active every day. This includes: ? Aerobic exercise like running or swimming. ? Leisure activities like gardening, walking, or housework.  Get 7-8 hours of sleep each night.  If recommended by your health care provider, drink red wine in moderation. This means 1 glass a day for nonpregnant women and 2 glasses a day for men. A glass of wine equals 5 oz (150 mL). Reading food labels  Check the serving size of packaged foods. For foods such as rice and pasta, the serving size refers to the amount of cooked product, not dry.  Check the total fat in packaged foods. Avoid foods that have saturated fat or trans fats.  Check the ingredients list for added sugars, such as corn syrup. Shopping  At the grocery store, buy most of your food from the areas near the walls of the store. This includes: ? Fresh fruits and vegetables  (produce). ? Grains, beans, nuts, and seeds. Some of these may be available in unpackaged forms or large amounts (in bulk). ? Fresh seafood. ? Poultry and eggs. ? Low-fat dairy products.  Buy whole ingredients instead of prepackaged foods.  Buy fresh fruits and vegetables in-season from local farmers markets.  Buy frozen fruits and vegetables in resealable bags.  If you do not have access to quality fresh seafood, buy precooked frozen shrimp or canned fish, such as tuna, salmon, or sardines.  Buy small amounts of raw or cooked vegetables, salads, or olives from the deli or salad bar at your store.  Stock your pantry so you always have certain foods on hand, such as olive oil, canned tuna, canned tomatoes, rice, pasta, and beans. Cooking  Cook foods with extra-virgin olive oil instead of using butter or other vegetable oils.  Have meat as a side dish, and have vegetables or grains as your main dish. This means having meat in small portions or adding small amounts of meat to foods like pasta or stew.  Use beans or vegetables instead of meat in common dishes like chili or lasagna.  Experiment with different cooking methods. Try roasting or broiling vegetables instead of steaming or sauteing them.  Add frozen vegetables to soups, stews, pasta, or rice.  Add nuts or seeds for added healthy fat at each meal. You can add these to yogurt, salads, or vegetable dishes.  Marinate fish or vegetables using olive oil, lemon juice, garlic, and fresh herbs. Meal planning  Plan to eat 1 vegetarian meal one day each week. Try to work up to 2 vegetarian meals, if possible.  Eat seafood 2 or more times a week.  Have healthy snacks readily available, such as: ? Vegetable sticks with hummus. ? [place] yogurt. ? Fruit and nut trail mix.  Eat balanced meals throughout the week. This includes: ? Fruit: 2-3 servings a day ? Vegetables: 4-5 servings a day ? Low-fat dairy: 2 servings a day ? Fish,  poultry, or lean meat: 1 serving a day ? Beans and legumes: 2 or more servings a week ? Nuts and seeds: 1-2 servings a day ? Whole grains: 6-8 servings a day ? Extra-virgin olive oil: 3-4 servings a day  Limit red meat and sweets to only a few servings a month What are my food choices?  Mediterranean diet ? Recommended ? Grains: Whole-grain pasta. Brown rice. Bulgar wheat. Polenta. Couscous. Whole-wheat bread. [place]. ? Vegetables: Artichokes. Beets. Broccoli. Cabbage. Carrots. Eggplant. Green beans. Chard. Kale. Spinach. Onions. Leeks. Peas. Squash. Tomatoes. Peppers. Radishes. ? Fruits: Apples. Apricots. Avocado. Berries. Bananas. Cherries. Dates. Figs. Grapes. Lemons. Melon. Oranges. Peaches. Plums. Pomegranate. ? Meats and other protein foods: Beans. Almonds. Sunflower seeds. Pine nuts. Peanuts. [place]. Salmon. Scallops. Shrimp. [place]. Tilapia. Clams. Oysters. Eggs. ? Dairy: Low-fat milk. Cheese. Greek yogurt. ? Beverages: Water. Red wine. Herbal tea. ? Fats and oils: Extra virgin olive oil. Avocado oil. Grape seed oil. ? Sweets and desserts: [place] yogurt with honey. Baked apples. Poached pears. Trail mix. ? Seasoning and other foods: Basil. Cilantro. Coriander. Cumin. Mint. Parsley. Sage. Rosemary. Tarragon. Garlic. Oregano. Thyme. Pepper. Balsalmic vinegar. Tahini. Hummus. Tomato sauce. Olives. Mushrooms. ? Limit these ? Grains: Prepackaged pasta or rice dishes. Prepackaged cereal with added sugar. ? Vegetables: Deep fried potatoes (french fries). ? Fruits: Fruit canned in syrup. ? Meats and other protein foods: Beef. Pork. Lamb. Poultry with skin. Hot dogs. [place] Salines. ? Dairy: Ice cream. Sour cream. Whole milk. ? Beverages: Juice. Sugar-sweetened soft drinks. Beer. Liquor and spirits. ? Fats and oils: Butter. Canola oil. Vegetable oil. Beef fat (tallow). Lard. ? Sweets and desserts: Cookies. Cakes. Pies. Candy. ? Seasoning and other foods: Mayonnaise. Premade sauces and  marinades. ? The items listed may not be a complete list. Talk with your dietitian about what dietary choices are right for you. Summary  The Mediterranean diet includes both food and lifestyle choices.  Eat a variety of fresh fruits and vegetables, beans, nuts, seeds, and whole grains.  Limit the amount of red meat and sweets that you eat.  Talk with your health care provider about whether it is safe for you to drink red wine in moderation. This means 1 glass a day for nonpregnant women and 2 glasses a day for men. A glass of wine equals 5 oz (150 mL). This information is not intended to replace advice given to you by your health care provider. Make sure you discuss any questions you have with your health care provider. Document Released: 10/28/2015 Document Revised: 11/30/2015 Document Reviewed: 10/28/2015 Elsevier Interactive Patient Education  2018 [place].  Heart Disease Prevention Heart disease is a leading cause of death. There are many things you can do to help prevent  heart disease. Be physically active Physical activity is good for your heart. It helps control your blood pressure, cholesterol levels, and weight. Try to be physically active every day. Ask your health care provider what activities are best for you. Be a healthy weight Extra weight can strain your heart and affect your blood pressure and cholesterol levels. Lose weight with diet and exercise if recommended by your health care provider. Eat heart-healthy foods Follow a healthy eating plan as recommended by your health care provider or dietitian. Heart-healthy foods include:  High-fiber foods. These include oat bran, oatmeal, and whole-grain breads and cereals.  Fruits and vegetables.  Avoid:  Alcohol.  Fried foods.  Foods high in saturated fat. These include meats, butter, whole dairy products, shortening, and coconut or palm oil.  Salty foods. These include canned food, luncheon meat, salty snacks,  and fast food.  Keep your cholesterol levels under control Cholesterol is a substance that is used for many important functions. When your cholesterol levels are high, cholesterol can stick to the insides of your blood vessels, making them narrow or clog. This can lead to chest pain (angina) and a heart attack. Keep your cholesterol levels under control as recommended by your health care provider. Have your cholesterol checked at least once a year. Target cholesterol levels (in mg/dL) for most people are:  Total cholesterol below 200.  LDL cholesterol below 100.  HDL cholesterol above 40 in men and above 50 in women.  Triglycerides below 150.  Keep your blood pressure under control Having high blood pressure (hypertension) puts you at risk for stroke and other forms of heart disease. Keep your blood pressure under control as recommended by your health care provider. Ask your health care provider if you need treatment to lower your blood pressure. If you are 64-74 years of age, have your blood pressure checked every 3-5 years. If you are 73 years of age or older, have your blood pressure checked every year. Do not use tobacco products Tobacco smoke can damage your heart and blood vessels. Do not use any tobacco products including cigarettes, chewing tobacco, or electronic cigarettes. If you need help quitting, ask your health care provider. Take medicines as directed Take medicines only as directed by your health care provider. Ask your health care provider whether you should take an aspirin every day. Taking aspirin can help reduce your risk of heart disease and stroke. Where to find more information: To find out more about heart disease, visit the American Heart Association's website at www.americanheart.org This information is not intended to replace advice given to you by your health care provider. Make sure you discuss any questions you have with your health care provider. Document  Released: 10/19/2003 Document Revised: 08/04/2015 Document Reviewed: 04/30/2013 Elsevier Interactive Patient Education  2018 [employer].     Signed, Buford Dresser, MD PhD 02/04/2018 3:31 PM    [place]

## 2018-02-04 NOTE — Patient Instructions (Addendum)
Medication Instructions:  Your Physician recommend you continue on your current medication as directed.    If you need a refill on your cardiac medications before your next appointment, please call your pharmacy.   Lab work: Your physician recommends that you return for lab work in today (lipid)  If you have labs (blood work) drawn today and your tests are completely normal, you will receive your results only by: Marland Kitchen MyChart Message (if you have MyChart) OR . A paper copy in the mail If you have any lab test that is abnormal or we need to change your treatment, we will call you to review the results.  Testing/Procedures: None  Follow-Up: At [facility], you and your health needs are our priority.  As part of our continuing mission to provide you with exceptional heart care, we have created designated Provider Care Teams.  These Care Teams include your primary Cardiologist (physician) and Advanced Practice Providers (APPs -  Physician Assistants and Nurse Practitioners) who all work together to provide you with the care you need, when you need it. You will need a follow up appointment in 6 months.  Please call our office 2 months in advance to schedule this appointment.  You may see Dr.Leliana Kontz or one of the following Advanced Practice Providers on your designated Care Team:   Rosaria Ferries, PA-C . Jory Sims, DNP, ANP  Any Other Special Instructions Will Be Listed Below (If Applicable)   Atherosclerosis Atherosclerosis is narrowing and hardening of the blood vessels (arteries). Arteries are tubes that carry blood that contains oxygen from the heart to all parts of the body. Arteries can become narrow or clogged with a buildup of fat, cholesterol, calcium, or other substances (plaque). Plaque decreases the amount of blood that can flow through the artery. Atherosclerosis can affect any artery in the body, including:  Heart arteries (coronary artery disease), which may cause  heart attack.  Brain arteries, which may cause stroke.  Leg, arm, and pelvis arteries (peripheral artery disease), which may cause pain and numbness.  Kidney arteries, which may cause kidney (renal) failure.  Treatment may slow the disease and prevent further damage to the heart, brain, peripheral arteries, and kidneys. What are the causes? Atherosclerosis develops when plaque forms in an artery. This damages the inside wall of the artery. Over time, the plaque grows and hardens. It may break through the artery wall. This causes a blood clot to form over the break, which narrows the artery more. The clot may also break loose and travel to other arteries, causing more damage. What increases the risk? This condition is more likely to develop in people who:  Are middle age or older.  Have a family history of atherosclerosis.  Have high cholesterol.  Have high blood fats (triglycerides).  Have diabetes.  Are overweight.  Smoke tobacco.  Do not exercise enough.  Have a substance in the blood that indicates increased levels of inflammation in the body (C-reactive protein, or CRP).  Have sleep apnea.  Are stressed.  Drink too much alcohol.  What are the signs or symptoms? This condition may not cause any symptoms. If you do have symptoms, they are caused by damage to an area of your body that is not getting enough blood. The following symptoms are possible:  Coronary artery disease may cause chest pain and shortness of breath.  Decreased blood supply to your brain may cause a stroke. Signs and symptoms of stroke may include sudden: ? Weakness on one side  of the body. ? Confusion. ? Changes in vision. ? Inability to speak or understand speech. ? Loss of balance, coordination, or ability to walk. ? Severe headache. ? Loss of consciousness.  Peripheral artery disease may cause pain and numbness, often in the legs and hips.  Renal failure may cause fatigue, nausea,  swelling, and itchy skin.  How is this diagnosed? This condition is diagnosed based on your medical history and a physical exam. During the exam, your health care provider will check your pulses and listen for a "whooshing" sound over your arteries (bruit). You may have tests, such as:  Blood tests to check your levels of cholesterol, triglycerides, and CRP.  Electrocardiogram (ECG) to check for heart damage.  Chest X-ray to see if your heart is enlarged, which is a sign of heart failure.  Stress test to see how your heart reacts to exercise.  Echocardiogram to get images of the inside of your heart.  Ankle-Brachial index to compare blood pressure in your arms to blood pressure in your ankles.  Ultrasound of your peripheral arteries to check blood flow.  CT scan to check for damage to your heart or brain.  X-rays of blood vessels after dye has been injected (angiogram) to check blood flow.  How is this treated? Treatment starts with lifestyle changes, which may include:  Changing your diet.  Losing weight.  Reducing stress.  Exercising and being more physically active.  Not smoking.  You also may need medicine to:  Lower triglycerides and cholesterol.  Lower and control blood pressure.  Prevent blood clots.  Lower inflammation in your body.  Lower and control your blood sugar.  Sometimes, surgery is needed to remove plaque, widen your artery, or create a new path for your blood (bypass). Surgical treatment may include:  Removing plaque from an artery (endarterectomy).  Opening a narrowed heart artery (angioplasty).  Heart or peripheral artery bypass graft surgery.  Follow these instructions at home: Lifestyle   Eat a heart-healthy diet. Talk to your health care provider or a diet specialist (dietitian) if you need help. A heart-healthy diet includes: ? Limiting unhealthy fats and increasing healthy fats. Some examples of healthy fats are olive oil and  canola oil. ? Eating plant-based foods, such as fruits, vegetables, nuts, legumes, and whole grains.  Follow an exercise program as told by your health care provider.  Maintain a healthy weight. Lose weight if directed by your health care provider.  Rest when you are tired.  Learn to manage your stress.  Do not use any tobacco products, such as cigarettes, chewing tobacco, and e-cigarettes. If you need help quitting, ask your health care provider.  Limit alcohol intake to no more than 1 drink a day for nonpregnant women and 2 drinks a day for men. One drink equals 12 oz of beer, 5 oz of wine, or 1 oz of hard liquor.  Do not abuse drugs. General instructions  Take over-the-counter and prescription medicines only as told by your health care provider.  Manage other health conditions as told by your health care provider.  Keep all follow-up visits as told by your health care provider. This is important. Contact a health care provider if:  You have chest pain or discomfort. This includes squeezing chest pain that may feel like indigestion (angina).  You have shortness of breath.  You have an irregular heartbeat.  You have unexplained fatigue.  You have unexplained pain or numbness in an arm, leg, or hip.  You  have nausea, swelling of your hands or feet, and itchy skin. Get help right away if:  You have symptoms of a heart attack, such as: ? Chest pain. ? Shortness of breath. ? Pain in your neck, jaw, arms, back, or stomach. ? Cold sweat. ? Nausea. ? Light-headedness.  You have symptoms of a stroke, such as sudden: ? Weakness on one side of your body. ? Confusion. ? Changes in vision. ? Inability to speak or understand speech. ? Loss of balance, coordination, or ability to walk. ? Severe headache. ? Loss of consciousness. These symptoms may represent a serious problem that is an emergency. Do not wait to see if the symptoms will go away. Get medical help right away.  Call your local emergency services (911 in the [place] Do not drive yourself to the hospital. This information is not intended to replace advice given to you by your health care provider. Make sure you discuss any questions you have with your health care provider. Document Released: 05/27/2003 Document Revised: 08/12/2015 Document Reviewed: 01/25/2015 Elsevier Interactive Patient Education  2018 [place] refers to food and lifestyle choices that are based on the traditions of countries located on the [employer]. This way of eating has been shown to help prevent certain conditions and improve outcomes for people who have chronic diseases, like kidney disease and heart disease. What are tips for following this plan? Lifestyle  Cook and eat meals together with your family, when possible.  Drink enough fluid to keep your urine clear or pale yellow.  Be physically active every day. This includes: ? Aerobic exercise like running or swimming. ? Leisure activities like gardening, walking, or housework.  Get 7-8 hours of sleep each night.  If recommended by your health care provider, drink red wine in moderation. This means 1 glass a day for nonpregnant women and 2 glasses a day for men. A glass of wine equals 5 oz (150 mL). Reading food labels  Check the serving size of packaged foods. For foods such as rice and pasta, the serving size refers to the amount of cooked product, not dry.  Check the total fat in packaged foods. Avoid foods that have saturated fat or trans fats.  Check the ingredients list for added sugars, such as corn syrup. Shopping  At the grocery store, buy most of your food from the areas near the walls of the store. This includes: ? Fresh fruits and vegetables (produce). ? Grains, beans, nuts, and seeds. Some of these may be available in unpackaged forms or large amounts (in bulk). ? Fresh seafood. ? Poultry and  eggs. ? Low-fat dairy products.  Buy whole ingredients instead of prepackaged foods.  Buy fresh fruits and vegetables in-season from local farmers markets.  Buy frozen fruits and vegetables in resealable bags.  If you do not have access to quality fresh seafood, buy precooked frozen shrimp or canned fish, such as tuna, salmon, or sardines.  Buy small amounts of raw or cooked vegetables, salads, or olives from the deli or salad bar at your store.  Stock your pantry so you always have certain foods on hand, such as olive oil, canned tuna, canned tomatoes, rice, pasta, and beans. Cooking  Cook foods with extra-virgin olive oil instead of using butter or other vegetable oils.  Have meat as a side dish, and have vegetables or grains as your main dish. This means having meat in small portions or adding small amounts of meat  to foods like pasta or stew.  Use beans or vegetables instead of meat in common dishes like chili or lasagna.  Experiment with different cooking methods. Try roasting or broiling vegetables instead of steaming or sauteing them.  Add frozen vegetables to soups, stews, pasta, or rice.  Add nuts or seeds for added healthy fat at each meal. You can add these to yogurt, salads, or vegetable dishes.  Marinate fish or vegetables using olive oil, lemon juice, garlic, and fresh herbs. Meal planning  Plan to eat 1 vegetarian meal one day each week. Try to work up to 2 vegetarian meals, if possible.  Eat seafood 2 or more times a week.  Have healthy snacks readily available, such as: ? Vegetable sticks with hummus. ? [place] yogurt. ? Fruit and nut trail mix.  Eat balanced meals throughout the week. This includes: ? Fruit: 2-3 servings a day ? Vegetables: 4-5 servings a day ? Low-fat dairy: 2 servings a day ? Fish, poultry, or lean meat: 1 serving a day ? Beans and legumes: 2 or more servings a week ? Nuts and seeds: 1-2 servings a day ? Whole grains: 6-8 servings a  day ? Extra-virgin olive oil: 3-4 servings a day  Limit red meat and sweets to only a few servings a month What are my food choices?  Mediterranean diet ? Recommended ? Grains: Whole-grain pasta. Brown rice. Bulgar wheat. Polenta. Couscous. Whole-wheat bread. [place]. ? Vegetables: Artichokes. Beets. Broccoli. Cabbage. Carrots. Eggplant. Green beans. Chard. Kale. Spinach. Onions. Leeks. Peas. Squash. Tomatoes. Peppers. Radishes. ? Fruits: Apples. Apricots. Avocado. Berries. Bananas. Cherries. Dates. Figs. Grapes. Lemons. Melon. Oranges. Peaches. Plums. Pomegranate. ? Meats and other protein foods: Beans. Almonds. Sunflower seeds. Pine nuts. Peanuts. [place]. Salmon. Scallops. Shrimp. [place]. Tilapia. Clams. Oysters. Eggs. ? Dairy: Low-fat milk. Cheese. Greek yogurt. ? Beverages: Water. Red wine. Herbal tea. ? Fats and oils: Extra virgin olive oil. Avocado oil. Grape seed oil. ? Sweets and desserts: [place] yogurt with honey. Baked apples. Poached pears. Trail mix. ? Seasoning and other foods: Basil. Cilantro. Coriander. Cumin. Mint. Parsley. Sage. Rosemary. Tarragon. Garlic. Oregano. Thyme. Pepper. Balsalmic vinegar. Tahini. Hummus. Tomato sauce. Olives. Mushrooms. ? Limit these ? Grains: Prepackaged pasta or rice dishes. Prepackaged cereal with added sugar. ? Vegetables: Deep fried potatoes (french fries). ? Fruits: Fruit canned in syrup. ? Meats and other protein foods: Beef. Pork. Lamb. Poultry with skin. Hot dogs. [place] Salines. ? Dairy: Ice cream. Sour cream. Whole milk. ? Beverages: Juice. Sugar-sweetened soft drinks. Beer. Liquor and spirits. ? Fats and oils: Butter. Canola oil. Vegetable oil. Beef fat (tallow). Lard. ? Sweets and desserts: Cookies. Cakes. Pies. Candy. ? Seasoning and other foods: Mayonnaise. Premade sauces and marinades. ? The items listed may not be a complete list. Talk with your dietitian about what dietary choices are right for you. Summary  The Mediterranean diet  includes both food and lifestyle choices.  Eat a variety of fresh fruits and vegetables, beans, nuts, seeds, and whole grains.  Limit the amount of red meat and sweets that you eat.  Talk with your health care provider about whether it is safe for you to drink red wine in moderation. This means 1 glass a day for nonpregnant women and 2 glasses a day for men. A glass of wine equals 5 oz (150 mL). This information is not intended to replace advice given to you by your health care provider. Make sure you discuss any questions you have with your health care provider.  Document Released: 10/28/2015 Document Revised: 11/30/2015 Document Reviewed: 10/28/2015 Elsevier Interactive Patient Education  2018 [place].  Heart Disease Prevention Heart disease is a leading cause of death. There are many things you can do to help prevent heart disease. Be physically active Physical activity is good for your heart. It helps control your blood pressure, cholesterol levels, and weight. Try to be physically active every day. Ask your health care provider what activities are best for you. Be a healthy weight Extra weight can strain your heart and affect your blood pressure and cholesterol levels. Lose weight with diet and exercise if recommended by your health care provider. Eat heart-healthy foods Follow a healthy eating plan as recommended by your health care provider or dietitian. Heart-healthy foods include:  High-fiber foods. These include oat bran, oatmeal, and whole-grain breads and cereals.  Fruits and vegetables.  Avoid:  Alcohol.  Fried foods.  Foods high in saturated fat. These include meats, butter, whole dairy products, shortening, and coconut or palm oil.  Salty foods. These include canned food, luncheon meat, salty snacks, and fast food.  Keep your cholesterol levels under control Cholesterol is a substance that is used for many important functions. When your cholesterol levels are  high, cholesterol can stick to the insides of your blood vessels, making them narrow or clog. This can lead to chest pain (angina) and a heart attack. Keep your cholesterol levels under control as recommended by your health care provider. Have your cholesterol checked at least once a year. Target cholesterol levels (in mg/dL) for most people are:  Total cholesterol below 200.  LDL cholesterol below 100.  HDL cholesterol above 40 in men and above 50 in women.  Triglycerides below 150.  Keep your blood pressure under control Having high blood pressure (hypertension) puts you at risk for stroke and other forms of heart disease. Keep your blood pressure under control as recommended by your health care provider. Ask your health care provider if you need treatment to lower your blood pressure. If you are 39-53 years of age, have your blood pressure checked every 3-5 years. If you are 31 years of age or older, have your blood pressure checked every year. Do not use tobacco products Tobacco smoke can damage your heart and blood vessels. Do not use any tobacco products including cigarettes, chewing tobacco, or electronic cigarettes. If you need help quitting, ask your health care provider. Take medicines as directed Take medicines only as directed by your health care provider. Ask your health care provider whether you should take an aspirin every day. Taking aspirin can help reduce your risk of heart disease and stroke. Where to find more information: To find out more about heart disease, visit the American Heart Association's website at www.americanheart.org This information is not intended to replace advice given to you by your health care provider. Make sure you discuss any questions you have with your health care provider. Document Released: 10/19/2003 Document Revised: 08/04/2015 Document Reviewed: 04/30/2013 Elsevier Interactive Patient Education  [employer].

## 2018-02-05 ENCOUNTER — Encounter: Payer: Self-pay | Admitting: Cardiology

## 2018-02-05 DIAGNOSIS — E782 Mixed hyperlipidemia: Secondary | ICD-10-CM | POA: Insufficient documentation

## 2018-02-05 LAB — LIPID PANEL
CHOLESTEROL TOTAL: 145 mg/dL (ref 100–199)
Chol/HDL Ratio: 2.5 ratio (ref 0.0–4.4)
HDL: 58 mg/dL (ref >39)
LDL CALC: 63 mg/dL (ref 0–99)
Triglycerides: 120 mg/dL (ref 0–149)
VLDL Cholesterol Cal: 24 mg/dL (ref 5–40)

## 2018-03-06 ENCOUNTER — Other Ambulatory Visit: Payer: Self-pay | Admitting: Adult Health

## 2018-03-06 DIAGNOSIS — F331 Major depressive disorder, recurrent, moderate: Secondary | ICD-10-CM

## 2018-03-07 NOTE — Telephone Encounter (Signed)
Filled for 90 days.  Left a message for the pt to return call.  Due for cpx on/after 04/12/18.  CRM has been created.

## 2018-03-27 ENCOUNTER — Ambulatory Visit (INDEPENDENT_AMBULATORY_CARE_PROVIDER_SITE_OTHER): Payer: Medicare HMO | Admitting: Adult Health

## 2018-03-27 ENCOUNTER — Encounter: Payer: Self-pay | Admitting: Adult Health

## 2018-03-27 VITALS — BP 114/60 | Temp 98.7°F | Ht 64.0 in | Wt 196.0 lb

## 2018-03-27 DIAGNOSIS — R42 Dizziness and giddiness: Secondary | ICD-10-CM | POA: Diagnosis not present

## 2018-03-27 NOTE — Progress Notes (Signed)
Subjective:    Patient ID: Kara Huynh, female    DOB: 05-17-1945, 73 y.o.   MRN: [id number]  HPI   73 year old female who  has a past medical history of Anxiety, Arthritis, Cancer ([place]), Depression, Endometrial polyp (02-17-13), Hyperlipidemia, Hypertension, Hypothyroidism, and Type 2 diabetes mellitus ([place]).  She presents to the office today for an acute issue of dizziness. She is unsure of time duration. Reports dizziness is worse in the morning but can happen with change in positions. . She does not feel as though the world is spinning. Has been daily occurrence.   Denies syncope, blurred vision, or headaches.   Review of Systems  Constitutional: Negative.   HENT: Negative.   Eyes: Negative.   Respiratory: Negative.   Cardiovascular: Negative.   Endocrine: Negative.   Genitourinary: Negative.   Skin: Negative.   Neurological: Positive for dizziness.   Past Medical History:  Diagnosis Date  . Anxiety   . Arthritis   . Cancer ([employer])    uterine  . Depression   . Endometrial polyp 02-17-13   11'14 Dx. endometrial cancer  . Hyperlipidemia   . Hypertension   . Hypothyroidism   . Type 2 diabetes mellitus ([place])     Social History   Socioeconomic History  . Marital status: Widowed    Spouse name: Not on file  . Number of children: Not on file  . Years of education: Not on file  . Highest education level: Not on file  Occupational History  . Not on file  Social Needs  . Financial resource strain: Not on file  . Food insecurity:    Worry: Not on file    Inability: Not on file  . Transportation needs:    Medical: Not on file    Non-medical: Not on file  Tobacco Use  . Smoking status: Former Smoker    Packs/day: 1.50    Years: 47.00    Pack years: 70.50    Last attempt to quit: 07/04/2009    Years since quitting: 8.7  . Smokeless tobacco: Never Used  . Tobacco comment: quit a long time ago  Substance and Sexual Activity  . Alcohol use: Yes    Comment: twice  monthly; had her 1st glass of wine in 2 year   . Drug use: No  . Sexual activity: Yes    Partners: Male    Birth control/protection: Post-menopausal  Lifestyle  . Physical activity:    Days per week: Not on file    Minutes per session: Not on file  . Stress: Not on file  Relationships  . Social connections:    Talks on phone: Not on file    Gets together: Not on file    Attends religious service: Not on file    Active member of club or organization: Not on file    Attends meetings of clubs or organizations: Not on file    Relationship status: Not on file  . Intimate partner violence:    Fear of current or ex partner: Not on file    Emotionally abused: Not on file    Physically abused: Not on file    Forced sexual activity: Not on file  Other Topics Concern  . Not on file  Social History Narrative   Retired from being an Web designer   Married for 35 years   3 step children and 1 adopted child. They live all over the [name]    Past Surgical History:  Procedure Laterality Date  . APPENDECTOMY    . BREAST SURGERY     biopsy  . CATARACT EXTRACTION, BILATERAL Bilateral 02-17-13   bilateral  . DILITATION & CURRETTAGE/HYSTROSCOPY WITH VERSAPOINT RESECTION N/A 01/31/2013   Procedure: DILATATION & CURETTAGE/HYSTEROSCOPY WITH CERVICAL BLOCK;  Surgeon: Marylynn Pearson, MD;  Location: [facility];  Service: Gynecology;  Laterality: N/A;  . LAPAROSCOPY FOR ECTOPIC PREGNANCY  1977   SALPINGOSTOMY (SIDE UNKNOWN)  . LAPAROTOMY Bilateral 02/18/2013   Procedure: EXPLORATORY LAPAROTOMY TOTAL ABDMONIAL HYSTERECTOMY BILATERAL SALPINGO OOPHORECTOMY ;  Surgeon: Alvino Chapel, MD;  Location: [facility];  Service: Gynecology;  Laterality: Bilateral;  . TOTAL ABDOMINAL HYSTERECTOMY W/ BILATERAL SALPINGOOPHORECTOMY    . TOTAL KNEE ARTHROPLASTY Bilateral     Family History  Problem Relation Age of Onset  . Arthritis Mother   . Mental illness Mother   . Diabetes Mother   . Cancer  Father        colon, lung, and prostate  . Arthritis Father   . Hyperlipidemia Father   . Stroke Father   . Heart disease Father   . Hypertension Father   . Colitis Father   . Esophageal cancer Neg Hx   . Rectal cancer Neg Hx   . Stomach cancer Neg Hx     No Known Allergies  Current Outpatient Medications on File Prior to Visit  Medication Sig Dispense Refill  . aspirin EC 81 MG tablet Take 81 mg by mouth daily.    Marland Kitchen atorvastatin (LIPITOR) 20 MG tablet Take 1 tablet (20 mg total) by mouth daily. 90 tablet 3  . FLUoxetine (PROZAC) 40 MG capsule TAKE 1 CAPSULE BY MOUTH EVERY DAY 90 capsule 0  . levothyroxine (SYNTHROID, LEVOTHROID) 25 MCG tablet TAKE 1 TABLET EVERY MORNING AS DIRECTED 90 tablet 2  . lisinopril (PRINIVIL,ZESTRIL) 10 MG tablet TAKE 1 TABLET BY MOUTH EVERY DAY 90 tablet 2   No current facility-administered medications on file prior to visit.     BP 114/60   Temp 98.7 F (37.1 C)   Ht 5\' 4"  (1.626 m)   Wt 196 lb (88.9 kg)   BMI 33.64 kg/m       Objective:   Physical Exam Vitals signs and nursing note reviewed.  Constitutional:      General: She is not in acute distress.    Appearance: She is well-developed and normal weight.  HENT:     Head: Normocephalic and atraumatic.     Right Ear: Tympanic membrane, ear canal and external ear normal. There is no impacted cerumen.     Left Ear: Tympanic membrane, ear canal and external ear normal. There is no impacted cerumen.     Nose: No congestion.  Eyes:     General:        Right eye: No discharge.        Left eye: No discharge.     Extraocular Movements: Extraocular movements intact.     Right eye: Normal extraocular motion and no nystagmus.     Left eye: Normal extraocular motion and no nystagmus.     Conjunctiva/sclera: Conjunctivae normal.     Pupils: Pupils are equal, round, and reactive to light.  Neck:     Musculoskeletal: Normal range of motion and neck supple.     Thyroid: No thyromegaly.      Vascular: No carotid bruit.     Trachea: No tracheal deviation.  Cardiovascular:     Rate and Rhythm: Normal rate and regular rhythm.  Pulses: Normal pulses.     Heart sounds: Normal heart sounds. No murmur. No friction rub. No gallop.   Pulmonary:     Effort: Pulmonary effort is normal. No respiratory distress.     Breath sounds: Normal breath sounds. No stridor. No wheezing, rhonchi or rales.  Chest:     Chest wall: No tenderness.  Musculoskeletal: Normal range of motion.  Lymphadenopathy:     Cervical: No cervical adenopathy.  Skin:    General: Skin is warm.     Capillary Refill: Capillary refill takes less than 2 seconds.  Neurological:     General: No focal deficit present.     Mental Status: She is alert and oriented to person, place, and time. Mental status is at baseline.     Cranial Nerves: No cranial nerve deficit.     Coordination: Coordination normal.       Assessment & Plan:  1. Dizziness - possibly from hypotension.  - Will have her decrease lisinopril to 5 mg  - Will follow up at her CPE in a few weeks   Dorothyann Peng, NP

## 2018-03-29 ENCOUNTER — Other Ambulatory Visit: Payer: Self-pay | Admitting: Adult Health

## 2018-03-29 ENCOUNTER — Telehealth: Payer: Self-pay | Admitting: Adult Health

## 2018-03-29 MED ORDER — LISINOPRIL 5 MG PO TABS: 5.0000 mg | ORAL_TABLET | Freq: Every day | ORAL | 3 refills | Status: DC

## 2018-03-29 NOTE — Telephone Encounter (Signed)
Ok to change prescription and refill for 90 +3

## 2018-03-29 NOTE — Telephone Encounter (Signed)
Copied from [place] [id number]. Topic: Quick Communication - Rx Refill/Question >> Mar 29, 2018 10:37 AM Scherrie Gerlach wrote: Medication: lisinopril (PRINIVIL,ZESTRIL) 5 MG tablet Pt states she needs a new Rx for the 5 mg. Pt states she was instructed to decrease and states the 10 mg is too small to cut in half. [id number] [address], [place] (Phone) [phone] (Fax)  Pt would like a call back to confirm this is the correct med she was to decrease.

## 2018-03-29 NOTE — Telephone Encounter (Signed)
Sent to the pharmacy by e-scribe. 

## 2018-03-29 NOTE — Telephone Encounter (Signed)
Ok to change rx?

## 2018-04-16 ENCOUNTER — Other Ambulatory Visit: Payer: Self-pay | Admitting: Adult Health

## 2018-04-16 DIAGNOSIS — I10 Essential (primary) hypertension: Secondary | ICD-10-CM

## 2018-04-16 NOTE — Telephone Encounter (Signed)
Denied.  This rx was filled for 1 year on 03/29/2018

## 2018-05-07 ENCOUNTER — Encounter: Payer: Self-pay | Admitting: Adult Health

## 2018-05-07 ENCOUNTER — Ambulatory Visit (INDEPENDENT_AMBULATORY_CARE_PROVIDER_SITE_OTHER): Payer: Medicare HMO | Admitting: Adult Health

## 2018-05-07 VITALS — BP 130/90 | Temp 97.8°F | Ht 64.5 in | Wt 197.0 lb

## 2018-05-07 DIAGNOSIS — E119 Type 2 diabetes mellitus without complications: Secondary | ICD-10-CM | POA: Diagnosis not present

## 2018-05-07 DIAGNOSIS — R69 Illness, unspecified: Secondary | ICD-10-CM | POA: Diagnosis not present

## 2018-05-07 DIAGNOSIS — E782 Mixed hyperlipidemia: Secondary | ICD-10-CM | POA: Diagnosis not present

## 2018-05-07 DIAGNOSIS — Z0000 Encounter for general adult medical examination without abnormal findings: Secondary | ICD-10-CM | POA: Diagnosis not present

## 2018-05-07 DIAGNOSIS — I10 Essential (primary) hypertension: Secondary | ICD-10-CM

## 2018-05-07 DIAGNOSIS — F331 Major depressive disorder, recurrent, moderate: Secondary | ICD-10-CM

## 2018-05-07 LAB — CBC WITH DIFFERENTIAL/PLATELET
BASOS ABS: 0 10*3/uL (ref 0.0–0.1)
Basophils Relative: 0.3 % (ref 0.0–3.0)
EOS PCT: 4 % (ref 0.0–5.0)
Eosinophils Absolute: 0.2 10*3/uL (ref 0.0–0.7)
HEMATOCRIT: 40.9 % (ref 36.0–46.0)
HEMOGLOBIN: 13.3 g/dL (ref 12.0–15.0)
LYMPHS PCT: 29.1 % (ref 12.0–46.0)
Lymphs Abs: 1.7 10*3/uL (ref 0.7–4.0)
MCHC: 32.5 g/dL (ref 30.0–36.0)
MCV: 91.4 fl (ref 78.0–100.0)
MONOS PCT: 4.8 % (ref 3.0–12.0)
Monocytes Absolute: 0.3 10*3/uL (ref 0.1–1.0)
Neutro Abs: 3.7 10*3/uL (ref 1.4–7.7)
Neutrophils Relative %: 61.8 % (ref 43.0–77.0)
Platelets: 235 10*3/uL (ref 150.0–400.0)
RBC: 4.47 Mil/uL (ref 3.87–5.11)
RDW: 14.7 % (ref 11.5–15.5)
WBC: 5.9 10*3/uL (ref 4.0–10.5)

## 2018-05-07 LAB — COMPREHENSIVE METABOLIC PANEL
ALBUMIN: 4.5 g/dL (ref 3.5–5.2)
ALT: 18 U/L (ref 0–35)
AST: 18 U/L (ref 0–37)
Alkaline Phosphatase: 68 U/L (ref 39–117)
BUN: 16 mg/dL (ref 6–23)
CO2: 29 mEq/L (ref 19–32)
Calcium: 9.3 mg/dL (ref 8.4–10.5)
Chloride: 103 mEq/L (ref 96–112)
Creatinine, Ser: 1.3 mg/dL — ABNORMAL HIGH (ref 0.40–1.20)
GFR: 39.97 mL/min — AB (ref >60.00)
Glucose, Bld: 89 mg/dL (ref 70–99)
POTASSIUM: 4.3 meq/L (ref 3.5–5.1)
Sodium: 142 mEq/L (ref 135–145)
Total Bilirubin: 0.6 mg/dL (ref 0.2–1.2)
Total Protein: 6.8 g/dL (ref 6.0–8.3)

## 2018-05-07 LAB — HEMOGLOBIN A1C: HEMOGLOBIN A1C: 5.9 % (ref 4.6–6.5)

## 2018-05-07 LAB — LIPID PANEL
Cholesterol: 185 mg/dL (ref 0–200)
HDL: 66.7 mg/dL (ref >39.00)
LDL Cholesterol: 92 mg/dL (ref 0–99)
NONHDL: 118.65
Total CHOL/HDL Ratio: 3
Triglycerides: 135 mg/dL (ref 0.0–149.0)
VLDL: 27 mg/dL (ref 0.0–40.0)

## 2018-05-07 LAB — TSH: TSH: 1.65 u[IU]/mL (ref 0.35–4.50)

## 2018-05-07 NOTE — Progress Notes (Signed)
Subjective:    Patient ID: Kara Huynh, female    DOB: 1946/02/23, 73 y.o.   MRN: [id number]  HPI Patient presents for yearly preventative medicine examination. She is a pleasant 73 year old female who  has a past medical history of Anxiety, Arthritis, Cancer ([place]), Depression, Endometrial polyp (02-17-13), Hyperlipidemia, Hypertension, and Hypothyroidism.  Hyperlipidemia-takes Lipitor 20 mg tablets and aspirin 81 mg daily Lab Results  Component Value Date   CHOL 145 02/04/2018   HDL 58 02/04/2018   LDLCALC 63 02/04/2018   LDLDIRECT 185.0 04/11/2017   TRIG 120 02/04/2018   CHOLHDL 2.5 02/04/2018   Hypothyroidism-takes Synthroid 25 mcg daily Lab Results  Component Value Date   TSH 1.38 04/11/2017   Hypertension-only prescribed lisinopril 5 mg daily.  About 3 weeks ago she presented to the office with the acute complaint of dizziness that was especially apparent when changing positions.  At this time it was thought that it could be due to hypotension so her lisinopril was decreased from 10 mg to 5 mg. She reports that since the decrease she is no longer having episodes of dizziness.  BP Readings from Last 3 Encounters:  05/07/18 (!) 160/80  03/27/18 114/60  02/04/18 136/66   Anxiety and depression-takes Prozac 40 mg daily. She reports that she is starting to get out more , but is still fixated on her husbands death three years ago.   DM - Well controlled without medications.  Lab Results  Component Value Date   HGBA1C 5.7 06/12/2017    All immunizations and health maintenance protocols were reviewed with the patient and needed orders were placed. UTD on vaccinations   Appropriate screening laboratory values were ordered for the patient including screening of hyperlipidemia, renal function and hepatic function.  Medication reconciliation,  past medical history, social history, problem list and allergies were reviewed in detail with the patient  Goals were established with  regard to weight loss, exercise, and  diet in compliance with medications  End of life planning was discussed.  She is up-to-date on routine mammogram and colonoscopies   Review of Systems  Constitutional: Negative.   HENT: Negative.   Eyes: Negative.   Respiratory: Negative.   Cardiovascular: Negative.   Gastrointestinal: Negative.   Endocrine: Negative.   Genitourinary: Negative.   Musculoskeletal: Negative.   Skin: Negative.   Allergic/Immunologic: Negative.   Neurological: Negative.   Hematological: Negative.   Psychiatric/Behavioral: Negative.    Past Medical History:  Diagnosis Date  . Anxiety   . Arthritis   . Cancer ([place])    uterine  . Depression   . Endometrial polyp 02-17-13   11'14 Dx. endometrial cancer  . Hyperlipidemia   . Hypertension   . Hypothyroidism     Social History   Socioeconomic History  . Marital status: Widowed    Spouse name: Not on file  . Number of children: Not on file  . Years of education: Not on file  . Highest education level: Not on file  Occupational History  . Not on file  Social Needs  . Financial resource strain: Not on file  . Food insecurity:    Worry: Not on file    Inability: Not on file  . Transportation needs:    Medical: Not on file    Non-medical: Not on file  Tobacco Use  . Smoking status: Former Smoker    Packs/day: 1.50    Years: 47.00    Pack years: 70.50    Last  attempt to quit: 07/04/2009    Years since quitting: 8.8  . Smokeless tobacco: Never Used  . Tobacco comment: quit a long time ago  Substance and Sexual Activity  . Alcohol use: Yes    Comment: twice monthly; had her 1st glass of wine in 2 year   . Drug use: No  . Sexual activity: Yes    Partners: Male    Birth control/protection: Post-menopausal  Lifestyle  . Physical activity:    Days per week: Not on file    Minutes per session: Not on file  . Stress: Not on file  Relationships  . Social connections:    Talks on phone: Not on  file    Gets together: Not on file    Attends religious service: Not on file    Active member of club or organization: Not on file    Attends meetings of clubs or organizations: Not on file    Relationship status: Not on file  . Intimate partner violence:    Fear of current or ex partner: Not on file    Emotionally abused: Not on file    Physically abused: Not on file    Forced sexual activity: Not on file  Other Topics Concern  . Not on file  Social History Narrative   Retired from being an Web designer   Married for 35 years   3 step children and 1 adopted child. They live all over the [name]    Past Surgical History:  Procedure Laterality Date  . APPENDECTOMY    . BREAST SURGERY     biopsy  . CATARACT EXTRACTION, BILATERAL Bilateral 02-17-13   bilateral  . DILITATION & CURRETTAGE/HYSTROSCOPY WITH VERSAPOINT RESECTION N/A 01/31/2013   Procedure: DILATATION & CURETTAGE/HYSTEROSCOPY WITH CERVICAL BLOCK;  Surgeon: Marylynn Pearson, MD;  Location: [facility];  Service: Gynecology;  Laterality: N/A;  . LAPAROSCOPY FOR ECTOPIC PREGNANCY  1977   SALPINGOSTOMY (SIDE UNKNOWN)  . LAPAROTOMY Bilateral 02/18/2013   Procedure: EXPLORATORY LAPAROTOMY TOTAL ABDMONIAL HYSTERECTOMY BILATERAL SALPINGO OOPHORECTOMY ;  Surgeon: Alvino Chapel, MD;  Location: [facility];  Service: Gynecology;  Laterality: Bilateral;  . TOTAL ABDOMINAL HYSTERECTOMY W/ BILATERAL SALPINGOOPHORECTOMY    . TOTAL KNEE ARTHROPLASTY Bilateral     Family History  Problem Relation Age of Onset  . Arthritis Mother   . Mental illness Mother   . Diabetes Mother   . Cancer Father        colon, lung, and prostate  . Arthritis Father   . Hyperlipidemia Father   . Stroke Father   . Heart disease Father   . Hypertension Father   . Colitis Father   . Esophageal cancer Neg Hx   . Rectal cancer Neg Hx   . Stomach cancer Neg Hx     No Known Allergies  Current Outpatient Medications on File Prior to Visit    Medication Sig Dispense Refill  . aspirin EC 81 MG tablet Take 81 mg by mouth daily.    Marland Kitchen atorvastatin (LIPITOR) 20 MG tablet TAKE 1 TABLET BY MOUTH EVERY DAY 90 tablet 0  . FLUoxetine (PROZAC) 40 MG capsule TAKE 1 CAPSULE BY MOUTH EVERY DAY 90 capsule 0  . levothyroxine (SYNTHROID, LEVOTHROID) 25 MCG tablet TAKE 1 TABLET EVERY MORNING AS DIRECTED 90 tablet 2  . lisinopril (PRINIVIL,ZESTRIL) 5 MG tablet Take 1 tablet (5 mg total) by mouth daily. 90 tablet 3   No current facility-administered medications on file prior to visit.  BP (!) 160/80   Temp 97.8 F (36.6 C)   Ht 5' 4.5" (1.638 m)   Wt 197 lb (89.4 kg)   BMI 33.29 kg/m       Objective:   Physical Exam Vitals signs and nursing note reviewed.  Constitutional:      General: She is not in acute distress.    Appearance: Normal appearance. She is well-developed and normal weight.  HENT:     Head: Normocephalic and atraumatic.     Right Ear: Tympanic membrane, ear canal and external ear normal. There is no impacted cerumen.     Left Ear: Tympanic membrane, ear canal and external ear normal. There is no impacted cerumen.     Nose: Nose normal. No congestion or rhinorrhea.     Mouth/Throat:     Mouth: Mucous membranes are moist.     Pharynx: Oropharynx is clear. No oropharyngeal exudate or posterior oropharyngeal erythema.  Eyes:     General: No scleral icterus.       Right eye: No discharge.        Left eye: No discharge.     Conjunctiva/sclera: Conjunctivae normal.  Neck:     Musculoskeletal: Normal range of motion and neck supple. No neck rigidity or muscular tenderness.     Thyroid: No thyromegaly.     Vascular: No carotid bruit.     Trachea: No tracheal deviation.  Cardiovascular:     Rate and Rhythm: Normal rate and regular rhythm.     Heart sounds: Normal heart sounds. No murmur. No friction rub. No gallop.   Pulmonary:     Effort: Pulmonary effort is normal. No respiratory distress.     Breath sounds:  Normal breath sounds. No stridor. No wheezing, rhonchi or rales.  Chest:     Chest wall: No tenderness.  Abdominal:     General: Bowel sounds are normal. There is no distension.     Palpations: Abdomen is soft. There is no mass.     Tenderness: There is no abdominal tenderness. There is no right CVA tenderness, left CVA tenderness, guarding or rebound.     Hernia: No hernia is present.  Musculoskeletal: Normal range of motion.        General: No swelling, tenderness, deformity or signs of injury.     Right lower leg: No edema.     Left lower leg: No edema.  Lymphadenopathy:     Cervical: No cervical adenopathy.  Skin:    General: Skin is warm and dry.     Capillary Refill: Capillary refill takes less than 2 seconds.     Coloration: Skin is not jaundiced or pale.     Findings: No bruising, erythema, lesion or rash.  Neurological:     General: No focal deficit present.     Mental Status: She is alert and oriented to person, place, and time. Mental status is at baseline.     Cranial Nerves: No cranial nerve deficit.     Coordination: Coordination normal.  Psychiatric:        Behavior: Behavior normal.        Thought Content: Thought content normal.        Judgment: Judgment normal.       Assessment & Plan:  1. Routine general medical examination at a health care facility  - CBC with Differential/Platelet - Comprehensive metabolic panel - Hemoglobin A1c - Lipid panel - TSH  2. Moderate episode of recurrent major depressive disorder ([place]) - Continue with  Prozac.  - Consider therapy - has refused in the past  - Continue to get outside and do things with family and friends   3. Controlled type 2 diabetes mellitus without complication, without long-term current use of insulin ([place]) - Consider metformin  - Follow up in 6 months  - CBC with Differential/Platelet - Comprehensive metabolic panel - Hemoglobin A1c - Lipid panel - TSH  4. Essential hypertension - No change in  medications  - CBC with Differential/Platelet - Comprehensive metabolic panel - Hemoglobin A1c - Lipid panel - TSH  5. Mixed hyperlipidemia - Consider increase instatin  - CBC with Differential/Platelet - Comprehensive metabolic panel - Hemoglobin A1c - Lipid panel - TSH  Dorothyann Peng, NP

## 2018-06-10 ENCOUNTER — Other Ambulatory Visit: Payer: Self-pay | Admitting: Adult Health

## 2018-06-10 DIAGNOSIS — F331 Major depressive disorder, recurrent, moderate: Secondary | ICD-10-CM

## 2018-06-11 NOTE — Telephone Encounter (Signed)
SENT TO THE PHARMACY BY E-SCRIBE. 

## 2018-07-15 ENCOUNTER — Telehealth: Payer: Self-pay | Admitting: Cardiology

## 2018-07-15 NOTE — Telephone Encounter (Signed)
lmtcb to schedule 6 month fu appt./dc

## 2018-07-18 ENCOUNTER — Emergency Department (HOSPITAL_COMMUNITY): Payer: Medicare HMO

## 2018-07-18 ENCOUNTER — Emergency Department (HOSPITAL_COMMUNITY)
Admission: EM | Admit: 2018-07-18 | Discharge: 2018-07-18 | Disposition: A | Discharge status: Home / Self Care | Payer: Medicare HMO | Attending: Emergency Medicine | Admitting: Emergency Medicine

## 2018-07-18 ENCOUNTER — Other Ambulatory Visit: Payer: Self-pay

## 2018-07-18 DIAGNOSIS — I10 Essential (primary) hypertension: Secondary | ICD-10-CM | POA: Diagnosis not present

## 2018-07-18 DIAGNOSIS — Z87891 Personal history of nicotine dependence: Secondary | ICD-10-CM | POA: Insufficient documentation

## 2018-07-18 DIAGNOSIS — R112 Nausea with vomiting, unspecified: Secondary | ICD-10-CM | POA: Diagnosis not present

## 2018-07-18 DIAGNOSIS — R52 Pain, unspecified: Secondary | ICD-10-CM | POA: Diagnosis not present

## 2018-07-18 DIAGNOSIS — R410 Disorientation, unspecified: Secondary | ICD-10-CM | POA: Diagnosis not present

## 2018-07-18 DIAGNOSIS — E039 Hypothyroidism, unspecified: Secondary | ICD-10-CM | POA: Insufficient documentation

## 2018-07-18 DIAGNOSIS — Z8542 Personal history of malignant neoplasm of other parts of uterus: Secondary | ICD-10-CM | POA: Insufficient documentation

## 2018-07-18 DIAGNOSIS — R51 Headache: Secondary | ICD-10-CM | POA: Diagnosis not present

## 2018-07-18 DIAGNOSIS — R69 Illness, unspecified: Secondary | ICD-10-CM | POA: Diagnosis not present

## 2018-07-18 DIAGNOSIS — E119 Type 2 diabetes mellitus without complications: Secondary | ICD-10-CM | POA: Insufficient documentation

## 2018-07-18 DIAGNOSIS — F329 Major depressive disorder, single episode, unspecified: Secondary | ICD-10-CM | POA: Diagnosis not present

## 2018-07-18 DIAGNOSIS — R519 Headache, unspecified: Secondary | ICD-10-CM

## 2018-07-18 DIAGNOSIS — R0902 Hypoxemia: Secondary | ICD-10-CM | POA: Diagnosis not present

## 2018-07-18 DIAGNOSIS — Z79899 Other long term (current) drug therapy: Secondary | ICD-10-CM | POA: Insufficient documentation

## 2018-07-18 DIAGNOSIS — R42 Dizziness and giddiness: Secondary | ICD-10-CM | POA: Diagnosis not present

## 2018-07-18 DIAGNOSIS — Z96653 Presence of artificial knee joint, bilateral: Secondary | ICD-10-CM | POA: Insufficient documentation

## 2018-07-18 DIAGNOSIS — Z20828 Contact with and (suspected) exposure to other viral communicable diseases: Secondary | ICD-10-CM | POA: Diagnosis not present

## 2018-07-18 LAB — COMPREHENSIVE METABOLIC PANEL
ALT: 17 U/L (ref 0–44)
AST: 20 U/L (ref 15–41)
Albumin: 3.7 g/dL (ref 3.5–5.0)
Alkaline Phosphatase: 61 U/L (ref 38–126)
Anion gap: 9 (ref 5–15)
BUN: 14 mg/dL (ref 8–23)
CO2: 27 mmol/L (ref 22–32)
Calcium: 8.8 mg/dL — ABNORMAL LOW (ref 8.9–10.3)
Chloride: 101 mmol/L (ref 98–111)
Creatinine, Ser: 1.35 mg/dL — ABNORMAL HIGH (ref 0.44–1.00)
GFR calc Af Amer: 45 mL/min — ABNORMAL LOW (ref >60)
GFR calc non Af Amer: 39 mL/min — ABNORMAL LOW (ref >60)
Glucose, Bld: 112 mg/dL — ABNORMAL HIGH (ref 70–99)
Potassium: 4.5 mmol/L (ref 3.5–5.1)
Sodium: 137 mmol/L (ref 135–145)
Total Bilirubin: 0.6 mg/dL (ref 0.3–1.2)
Total Protein: 6.1 g/dL — ABNORMAL LOW (ref 6.5–8.1)

## 2018-07-18 LAB — CBC
HCT: 36.8 % (ref 36.0–46.0)
Hemoglobin: 11.6 g/dL — ABNORMAL LOW (ref 12.0–15.0)
MCH: 29.7 pg (ref 26.0–34.0)
MCHC: 31.5 g/dL (ref 30.0–36.0)
MCV: 94.4 fL (ref 80.0–100.0)
Platelets: 219 10*3/uL (ref 150–400)
RBC: 3.9 MIL/uL (ref 3.87–5.11)
RDW: 13.5 % (ref 11.5–15.5)
WBC: 6.8 10*3/uL (ref 4.0–10.5)
nRBC: 0 % (ref 0.0–0.2)

## 2018-07-18 LAB — LIPASE, BLOOD: Lipase: 31 U/L (ref 11–51)

## 2018-07-18 NOTE — ED Provider Notes (Signed)
[facility] EMERGENCY DEPARTMENT Provider Note   CSN: [id number] Arrival date & time: 07/18/18  7564    History   Chief Complaint Chief Complaint  Patient presents with  . Emesis  . Headache    HPI Kara Huynh is a 73 y.o. female.     HPI Patient is a 73 year old female presents the emergency department with reported headache and confusion this morning.  Found by EMS to be hypertensive with systolic blood pressures into the 230s.  Reportedly the patient called the son and the son reported that she seemed confused and was asking about her deceased husband who died 3 years ago.  Patient is aware now that her husband is deceased.  She reports resolution of her headache.  She denies weakness of her arms or legs.  She states "I get confused at times".  Denies fevers or chills but found to have an oral temp of 100.1 on arrival to the emergency department.  No new medications.  Denies recent cessation of medications.  No neck pain or stiffness.  No weakness of her arms or legs.  Denies chest pain or shortness of breath.  No cough.  No abdominal pain. Past Medical History:  Diagnosis Date  . Anxiety   . Arthritis   . Cancer ([place])    uterine  . Depression   . Endometrial polyp 02-17-13   11'14 Dx. endometrial cancer  . Hyperlipidemia   . Hypertension   . Hypothyroidism     Patient Active Problem List   Diagnosis Date Noted  . Mixed hyperlipidemia 02/05/2018  . Endometrial cancer ([place]) 02/11/2013  . Postmenopausal bleeding 11/27/2012  . Essential hypertension 07/04/2012  . Diabetes type 2, controlled ([place]) 07/04/2012  . Depression 07/04/2012  . Unspecified hypothyroidism 07/04/2012  . Unspecified vitamin D deficiency 07/04/2012  . Osteoarthrosis, unspecified whether generalized or localized, involving lower leg 07/04/2012    Past Surgical History:  Procedure Laterality Date  . APPENDECTOMY    . BREAST SURGERY     biopsy  . CATARACT EXTRACTION,  BILATERAL Bilateral 02-17-13   bilateral  . DILITATION & CURRETTAGE/HYSTROSCOPY WITH VERSAPOINT RESECTION N/A 01/31/2013   Procedure: DILATATION & CURETTAGE/HYSTEROSCOPY WITH CERVICAL BLOCK;  Surgeon: Marylynn Pearson, MD;  Location: [facility];  Service: Gynecology;  Laterality: N/A;  . LAPAROSCOPY FOR ECTOPIC PREGNANCY  1977   SALPINGOSTOMY (SIDE UNKNOWN)  . LAPAROTOMY Bilateral 02/18/2013   Procedure: EXPLORATORY LAPAROTOMY TOTAL ABDMONIAL HYSTERECTOMY BILATERAL SALPINGO OOPHORECTOMY ;  Surgeon: Alvino Chapel, MD;  Location: [facility];  Service: Gynecology;  Laterality: Bilateral;  . TOTAL ABDOMINAL HYSTERECTOMY W/ BILATERAL SALPINGOOPHORECTOMY    . TOTAL KNEE ARTHROPLASTY Bilateral      OB History    Gravida  2   Para      Term      Preterm      AB  2   Living        SAB  1   TAB      Ectopic  1   Multiple      Live Births               Home Medications    Prior to Admission medications   Medication Sig Start Date End Date Taking? Authorizing Provider  aspirin EC 81 MG tablet Take 81 mg by mouth daily.    [provider]  atorvastatin (LIPITOR) 20 MG tablet TAKE 1 TABLET BY MOUTH EVERY DAY 03/29/18   Nafziger, Tommi Rumps, NP  FLUoxetine (PROZAC) 40  MG capsule TAKE 1 CAPSULE BY MOUTH EVERY DAY 06/11/18   Nafziger, Tommi Rumps, NP  levothyroxine (SYNTHROID, LEVOTHROID) 25 MCG tablet TAKE 1 TABLET EVERY MORNING AS DIRECTED 06/13/17   Nafziger, Tommi Rumps, NP  lisinopril (PRINIVIL,ZESTRIL) 5 MG tablet Take 1 tablet (5 mg total) by mouth daily. 03/29/18   Dorothyann Peng, NP    Family History Family History  Problem Relation Age of Onset  . Arthritis Mother   . Mental illness Mother   . Diabetes Mother   . Cancer Father        colon, lung, and prostate  . Arthritis Father   . Hyperlipidemia Father   . Stroke Father   . Heart disease Father   . Hypertension Father   . Colitis Father   . Esophageal cancer Neg Hx   . Rectal cancer Neg Hx   . Stomach cancer Neg Hx      Social History Social History   Tobacco Use  . Smoking status: Former Smoker    Packs/day: 1.50    Years: 47.00    Pack years: 70.50    Last attempt to quit: 07/04/2009    Years since quitting: 9.0  . Smokeless tobacco: Never Used  . Tobacco comment: quit a long time ago  Substance Use Topics  . Alcohol use: Yes    Comment: twice monthly; had her 1st glass of wine in 2 year   . Drug use: No     Allergies   Patient has no known allergies.   Review of Systems Review of Systems  All other systems reviewed and are negative.    Physical Exam Updated Vital Signs BP (!) 117/55 (BP Location: Right Arm)   Pulse 64   Temp 100.1 F (37.8 C) (Oral)   Resp 18   Ht 5\' 4"  (1.626 m)   Wt 90.7 kg   SpO2 96%   BMI 34.33 kg/m   Physical Exam Vitals signs and nursing note reviewed.  Constitutional:      General: She is not in acute distress.    Appearance: She is well-developed.  HENT:     Head: Normocephalic and atraumatic.  Neck:     Musculoskeletal: Normal range of motion.  Cardiovascular:     Rate and Rhythm: Normal rate and regular rhythm.     Heart sounds: Normal heart sounds.  Pulmonary:     Effort: Pulmonary effort is normal.     Breath sounds: Normal breath sounds.  Abdominal:     General: There is no distension.     Palpations: Abdomen is soft.     Tenderness: There is no abdominal tenderness.  Musculoskeletal: Normal range of motion.  Skin:    General: Skin is warm and dry.  Neurological:     Mental Status: She is alert and oriented to person, place, and time.  Psychiatric:        Judgment: Judgment normal.      ED Treatments / Results  Labs (all labs ordered are listed, but only abnormal results are displayed) Labs Reviewed  CBC - Abnormal; Notable for the following components:      Result Value   Hemoglobin 11.6 (*)    All other components within normal limits  COMPREHENSIVE METABOLIC PANEL - Abnormal; Notable for the following components:    Glucose, Bld 112 (*)    Creatinine, Ser 1.35 (*)    Calcium 8.8 (*)    Total Protein 6.1 (*)    GFR calc non Af Amer 39 (*)  GFR calc Af Amer 45 (*)    All other components within normal limits  LIPASE, BLOOD    EKG EKG Interpretation  Date/Time:  Thursday July 18 2018 09:36:37 EDT Ventricular Rate:  61 PR Interval:    QRS Duration: 81 QT Interval:  431 QTC Calculation: 435 R Axis:   45 Text Interpretation:  Sinus rhythm Borderline short PR interval No old tracing to compare Confirmed by Jola Schmidt [phone]) on 07/18/2018 11:00:08 AM   Radiology Ct Head Wo Contrast  Result Date: 07/18/2018 CLINICAL DATA:  Headache with nausea and vomiting EXAM: CT HEAD WITHOUT CONTRAST TECHNIQUE: Contiguous axial images were obtained from the base of the skull through the vertex without intravenous contrast. COMPARISON:  None. FINDINGS: Brain: There is mild diffuse atrophy. There is no intracranial mass, hemorrhage, extra-axial fluid collection or midline shift. There is patchy small vessel disease in the centra semiovale bilaterally. No acute infarct is demonstrable on this study. Vascular: There is no appreciable hyperdense vessel. There is calcification in each carotid siphon region. Skull: The bony calvarium appears intact. Sinuses/Orbits: There is mucosal thickening in several ethmoid air cells. Frontal sinuses are aplastic. Orbits appear symmetric bilaterally. Other: Mastoid air cells are clear. IMPRESSION: Mild atrophy with patchy periventricular small vessel disease. No acute infarct. No mass or hemorrhage. There are foci of arterial vascular calcification. There is mucosal thickening in several ethmoid air cells. Electronically Signed   By: Lowella Grip III M.D.   On: 07/18/2018 10:25    Procedures Procedures (including critical care time)  Medications Ordered in ED Medications - No data to display   Initial Impression / Assessment and Plan / ED Course  I have reviewed the triage  vital signs and the nursing notes.  Pertinent labs & imaging results that were available during my care of the patient were reviewed by me and considered in my medical decision making (see chart for details).        CT without acute findings.  Some chronic findings.  Could represent small stroke.  Patient to undergo MRI imaging at this time.  She is overall well-appearing at this time.  Her blood pressure is 117/55.  MRI pending at this time.  If MRI is negative I feels that the patient is stable for discharge home from the emergency department with close primary care follow-up.  No chest pain or shortness of breath.  No cough.  No known contact with COVID-19 positive patients or patients under investigation for COVID-19.  Care transferred to Margarita Mail, PA-C to await MRI and final disposition  Final Clinical Impressions(s) / ED Diagnoses   Final diagnoses:  None    ED Discharge Orders    None       Jola Schmidt, MD 07/18/18 1134

## 2018-07-18 NOTE — ED Triage Notes (Signed)
Per EMS- pt has been more confused over the last few months but per son pt has more confusion today. Pt woke this morning with a headache and light sensitivity. Pt also reports "violent" episode of vomiting. Reports dizziness.   Dian Situ, Pts son- [phone] (in [place])

## 2018-07-18 NOTE — ED Notes (Signed)
Patient transported to MRI 

## 2018-07-18 NOTE — Discharge Instructions (Addendum)

## 2018-07-18 NOTE — ED Notes (Signed)
Assisted pt in removing metallic objects

## 2018-07-18 NOTE — ED Provider Notes (Signed)
7:43 PM  Assumed care of the patient from Dr. Venora Maples, clinical decision pending. Patient is a 73 y/o F with Headache and confusion this morning found to be hypertensive to the 230s.  ? Fever. Awaiting MRI. Mild renal insuffciency, hyperglycemia, and anemia noted. Otherwise labs unremarkable.    EKG Interpretation  Date/Time:  Thursday July 18 2018 09:36:37 EDT Ventricular Rate:  61 PR Interval:    QRS Duration: 81 QT Interval:  431 QTC Calculation: 435 R Axis:   45 Text Interpretation:  Sinus rhythm Borderline short PR interval No old tracing to compare Confirmed by Jola Schmidt [phone]) on 07/18/2018 11:00:08 AM        Results for orders placed or performed during the hospital encounter of 07/18/18  CBC  Result Value Ref Range   WBC 6.8 4.0 - 10.5 K/uL   RBC 3.90 3.87 - 5.11 MIL/uL   Hemoglobin 11.6 (L) 12.0 - 15.0 g/dL   HCT 36.8 36.0 - 46.0 %   MCV 94.4 80.0 - 100.0 fL   MCH 29.7 26.0 - 34.0 pg   MCHC 31.5 30.0 - 36.0 g/dL   RDW 13.5 11.5 - 15.5 %   Platelets 219 150 - 400 K/uL   nRBC 0.0 0.0 - 0.2 %  Comprehensive metabolic panel  Result Value Ref Range   Sodium 137 135 - 145 mmol/L   Potassium 4.5 3.5 - 5.1 mmol/L   Chloride 101 98 - 111 mmol/L   CO2 27 22 - 32 mmol/L   Glucose, Bld 112 (H) 70 - 99 mg/dL   BUN 14 8 - 23 mg/dL   Creatinine, Ser 1.35 (H) 0.44 - 1.00 mg/dL   Calcium 8.8 (L) 8.9 - 10.3 mg/dL   Total Protein 6.1 (L) 6.5 - 8.1 g/dL   Albumin 3.7 3.5 - 5.0 g/dL   AST 20 15 - 41 U/L   ALT 17 0 - 44 U/L   Alkaline Phosphatase 61 38 - 126 U/L   Total Bilirubin 0.6 0.3 - 1.2 mg/dL   GFR calc non Af Amer 39 (L) >60 mL/min   GFR calc Af Amer 45 (L) >60 mL/min   Anion gap 9 5 - 15  Lipase, blood  Result Value Ref Range   Lipase 31 11 - 51 U/L   Clinical Course as of Jul 17 1941  Thu Jul 18, 2018  1210 Temp: 98.6 F (37 C) [AH]    Clinical Course User Index [AH] Margarita Mail, PA-C    [id number], vomiting and hypertension VS: BP (!)  117/99 (BP Location: Right Arm)   Pulse 66   Temp 98.6 F (37 C) (Oral)   Resp 16   Ht 5\' 4"  (1.626 m)   Wt 90.7 kg   SpO2 98%   BMI 34.33 kg/m  [id number] is gathered by patient  and EMR. [id number] considerations for headache include subarachnoid hemorrhage, meningitis, temporal arteritis, glaucoma, cerebral ischemia, carotid/vertebral dissection, intracranial tumor, Venous sinus thrombosis, carbon monoxide poisoning, acute or chronic subdural hemorrhage.  Other considerations include: Migraine, Cluster headache, Hypertension, Caffeine, alcohol, or drug withdrawal, Pseudotumor cerebri, Arteriovenous malformation, Head injury, Neurocysticercosis, Post-lumbar puncture, Preeclampsia, Tension headache, Sinusitis, Cervical arthritis, Refractive error causing strain, Dental abscess, Otitis media, Temporomandibular joint syndrome, Depression, Somatoform disorder (eg, somatization) Trigeminal neuralgia, Glossopharyngeal neuralgia. Labs: I reviewed the labs which show No acute abnormalities Imaging: I personally reviewed the images (CT head/ MRI) which show(s) No acute abnormalities EKG:  EKG Interpretation  Date/Time:  Thursday July 18 2018 09:36:37 EDT Ventricular  Rate:  61 PR Interval:    QRS Duration: 81 QT Interval:  431 QTC Calculation: 435 R Axis:   45 Text Interpretation:  Sinus rhythm Borderline short PR interval No old tracing to compare Confirmed by Jola Schmidt [phone]) on 07/18/2018 11:00:08 AM      [id number] with transient HTN and headache.  CT/MRI negative. Ha and htn resolved. I doubt SAH or sentinel bleed event. Patient appears appropriate for discharge. Patient disposition:discharge Patient condition: Good. The patient appears reasonably screened and/or stabilized for discharge and I doubt any other medical condition or other [facility] requiring further screening, evaluation, or treatment in the ED at this time prior to discharge. I have discussed lab and/or imaging findings  with the patient and answered all questions/concerns to the best of my ability. I have discussed return precautions and OP follow up.       Margarita Mail, PA-C 07/18/18 1945    Jola Schmidt, MD 07/19/18 2151

## 2018-07-18 NOTE — ED Notes (Signed)
This RN acting as Art therapist and asked pt if she would like me to call family. Pt requested that her son, Kara Huynh be updated. This RN spoke with Kara Huynh. He stated- this morning he talked with her and she was asking where her husband was, and for his phone number. But Kara Huynh had to remind her that her husband passed away years ago. This was the first time she has been this confused, per her son. Pt was able to be reoriented, per son. Son very thankful for the updates. Will continue to communicate with son.

## 2018-07-18 NOTE — ED Notes (Signed)
Patient verbalizes understanding of discharge instructions. Opportunity for questioning and answers were provided. Armband removed by staff, pt discharged from ED.  

## 2018-07-22 ENCOUNTER — Other Ambulatory Visit: Payer: Self-pay | Admitting: Adult Health

## 2018-07-22 DIAGNOSIS — E039 Hypothyroidism, unspecified: Secondary | ICD-10-CM

## 2018-07-24 NOTE — Telephone Encounter (Signed)
Sent to the pharmacy by e-scribe. 

## 2018-07-26 ENCOUNTER — Encounter: Payer: Self-pay | Admitting: Gastroenterology

## 2018-08-05 ENCOUNTER — Other Ambulatory Visit: Payer: Self-pay | Admitting: Adult Health

## 2018-08-05 DIAGNOSIS — Z1231 Encounter for screening mammogram for malignant neoplasm of breast: Secondary | ICD-10-CM

## 2018-08-14 ENCOUNTER — Emergency Department (HOSPITAL_COMMUNITY)
Admission: EM | Admit: 2018-08-14 | Discharge: 2018-08-15 | Disposition: A | Discharge status: Home / Self Care | Payer: Medicare HMO | Attending: Emergency Medicine | Admitting: Emergency Medicine

## 2018-08-14 ENCOUNTER — Encounter (HOSPITAL_COMMUNITY): Payer: Self-pay | Admitting: Emergency Medicine

## 2018-08-14 ENCOUNTER — Emergency Department (HOSPITAL_COMMUNITY): Payer: Medicare HMO

## 2018-08-14 ENCOUNTER — Other Ambulatory Visit: Payer: Self-pay

## 2018-08-14 DIAGNOSIS — Z8542 Personal history of malignant neoplasm of other parts of uterus: Secondary | ICD-10-CM | POA: Insufficient documentation

## 2018-08-14 DIAGNOSIS — E039 Hypothyroidism, unspecified: Secondary | ICD-10-CM | POA: Diagnosis not present

## 2018-08-14 DIAGNOSIS — I10 Essential (primary) hypertension: Secondary | ICD-10-CM | POA: Insufficient documentation

## 2018-08-14 DIAGNOSIS — Z79899 Other long term (current) drug therapy: Secondary | ICD-10-CM | POA: Insufficient documentation

## 2018-08-14 DIAGNOSIS — Z7982 Long term (current) use of aspirin: Secondary | ICD-10-CM | POA: Diagnosis not present

## 2018-08-14 DIAGNOSIS — R410 Disorientation, unspecified: Secondary | ICD-10-CM | POA: Diagnosis not present

## 2018-08-14 DIAGNOSIS — R4020 Unspecified coma: Secondary | ICD-10-CM | POA: Diagnosis not present

## 2018-08-14 DIAGNOSIS — R4182 Altered mental status, unspecified: Secondary | ICD-10-CM | POA: Diagnosis not present

## 2018-08-14 DIAGNOSIS — E119 Type 2 diabetes mellitus without complications: Secondary | ICD-10-CM | POA: Insufficient documentation

## 2018-08-14 DIAGNOSIS — Z87891 Personal history of nicotine dependence: Secondary | ICD-10-CM | POA: Insufficient documentation

## 2018-08-14 DIAGNOSIS — R404 Transient alteration of awareness: Secondary | ICD-10-CM | POA: Diagnosis not present

## 2018-08-14 DIAGNOSIS — R51 Headache: Secondary | ICD-10-CM | POA: Diagnosis not present

## 2018-08-14 LAB — URINALYSIS, ROUTINE W REFLEX MICROSCOPIC
Bacteria, UA: NONE SEEN
Bilirubin Urine: NEGATIVE
Glucose, UA: NEGATIVE mg/dL
Ketones, ur: NEGATIVE mg/dL
Leukocytes,Ua: NEGATIVE
Nitrite: NEGATIVE
Protein, ur: NEGATIVE mg/dL
Specific Gravity, Urine: 1.018 (ref 1.005–1.030)
pH: 7 (ref 5.0–8.0)

## 2018-08-14 LAB — CBC WITH DIFFERENTIAL/PLATELET
Abs Immature Granulocytes: 0.03 10*3/uL (ref 0.00–0.07)
Basophils Absolute: 0 10*3/uL (ref 0.0–0.1)
Basophils Relative: 0 %
Eosinophils Absolute: 0.2 10*3/uL (ref 0.0–0.5)
Eosinophils Relative: 2 %
HCT: 39.6 % (ref 36.0–46.0)
Hemoglobin: 12.9 g/dL (ref 12.0–15.0)
Immature Granulocytes: 0 %
Lymphocytes Relative: 23 %
Lymphs Abs: 1.7 10*3/uL (ref 0.7–4.0)
MCH: 30.1 pg (ref 26.0–34.0)
MCHC: 32.6 g/dL (ref 30.0–36.0)
MCV: 92.3 fL (ref 80.0–100.0)
Monocytes Absolute: 0.3 10*3/uL (ref 0.1–1.0)
Monocytes Relative: 4 %
Neutro Abs: 5.2 10*3/uL (ref 1.7–7.7)
Neutrophils Relative %: 71 %
Platelets: 207 10*3/uL (ref 150–400)
RBC: 4.29 MIL/uL (ref 3.87–5.11)
RDW: 14 % (ref 11.5–15.5)
WBC: 7.4 10*3/uL (ref 4.0–10.5)
nRBC: 0 % (ref 0.0–0.2)

## 2018-08-14 LAB — BASIC METABOLIC PANEL
Anion gap: 13 (ref 5–15)
BUN: 16 mg/dL (ref 8–23)
CO2: 20 mmol/L — ABNORMAL LOW (ref 22–32)
Calcium: 9 mg/dL (ref 8.9–10.3)
Chloride: 105 mmol/L (ref 98–111)
Creatinine, Ser: 1.28 mg/dL — ABNORMAL HIGH (ref 0.44–1.00)
GFR calc Af Amer: 48 mL/min — ABNORMAL LOW (ref >60)
GFR calc non Af Amer: 41 mL/min — ABNORMAL LOW (ref >60)
Glucose, Bld: 106 mg/dL — ABNORMAL HIGH (ref 70–99)
Potassium: 4.2 mmol/L (ref 3.5–5.1)
Sodium: 138 mmol/L (ref 135–145)

## 2018-08-14 MED ORDER — ACETAMINOPHEN 500 MG PO TABS
1000.0000 mg | ORAL_TABLET | Freq: Once | ORAL | Status: AC
  Administered 2018-08-14: 1000 mg via ORAL
  Filled 2018-08-14: qty 2

## 2018-08-14 NOTE — ED Provider Notes (Signed)
[facility] EMERGENCY DEPARTMENT Provider Note   CSN: [id number] Arrival date & time: 08/14/18  1939    History   Chief Complaint Chief Complaint  Patient presents with   Altered Mental Status    HPI Kara Huynh is a 73 y.o. female.     The history is provided by the patient and the EMS personnel.  Altered Mental Status  Presenting symptoms: confusion   Severity:  Mild Most recent episode:  Today Episode history:  Single Timing:  Intermittent Progression:  Waxing and waning Chronicity:  Recurrent Context: dementia (possible?)   Context: not head injury, not recent illness and not recent infection   Associated symptoms: no abdominal pain, no agitation, no difficulty breathing, no fever, no hallucinations, no headaches, no palpitations, no rash, no seizures, no slurred speech, no suicidal behavior and no vomiting     Past Medical History:  Diagnosis Date   Anxiety    Arthritis    Cancer ([place])    uterine   Depression    Endometrial polyp 02-17-13   11'14 Dx. endometrial cancer   Hyperlipidemia    Hypertension    Hypothyroidism     Patient Active Problem List   Diagnosis Date Noted   Mixed hyperlipidemia 02/05/2018   Endometrial cancer ([place]) 02/11/2013   Postmenopausal bleeding 11/27/2012   Essential hypertension 07/04/2012   Diabetes type 2, controlled ([place]) 07/04/2012   Depression 07/04/2012   Unspecified hypothyroidism 07/04/2012   Unspecified vitamin D deficiency 07/04/2012   Osteoarthrosis, unspecified whether generalized or localized, involving lower leg 07/04/2012    Past Surgical History:  Procedure Laterality Date   APPENDECTOMY     BREAST SURGERY     biopsy   CATARACT EXTRACTION, BILATERAL Bilateral 02-17-13   bilateral   DILITATION & CURRETTAGE/HYSTROSCOPY WITH VERSAPOINT RESECTION N/A 01/31/2013   Procedure: DILATATION & CURETTAGE/HYSTEROSCOPY WITH CERVICAL BLOCK;  Surgeon: Marylynn Pearson, MD;   Location: [facility];  Service: Gynecology;  Laterality: N/A;   LAPAROSCOPY FOR ECTOPIC PREGNANCY  1977   SALPINGOSTOMY (SIDE UNKNOWN)   LAPAROTOMY Bilateral 02/18/2013   Procedure: EXPLORATORY LAPAROTOMY TOTAL ABDMONIAL HYSTERECTOMY BILATERAL SALPINGO OOPHORECTOMY ;  Surgeon: Alvino Chapel, MD;  Location: [facility];  Service: Gynecology;  Laterality: Bilateral;   TOTAL ABDOMINAL HYSTERECTOMY W/ BILATERAL SALPINGOOPHORECTOMY     TOTAL KNEE ARTHROPLASTY Bilateral      OB History    Gravida  2   Para      Term      Preterm      AB  2   Living        SAB  1   TAB      Ectopic  1   Multiple      Live Births               Home Medications    Prior to Admission medications   Medication Sig Start Date End Date Taking? Authorizing Provider  aspirin EC 81 MG tablet Take 81 mg by mouth daily.    [provider]  atorvastatin (LIPITOR) 20 MG tablet TAKE 1 TABLET BY MOUTH EVERY DAY 07/24/18   Nafziger, Tommi Rumps, NP  FLUoxetine (PROZAC) 40 MG capsule TAKE 1 CAPSULE BY MOUTH EVERY DAY 06/11/18   Nafziger, Tommi Rumps, NP  levothyroxine (SYNTHROID) 25 MCG tablet TAKE 1 TABLET EVERY MORNING AS DIRECTED 07/24/18   Nafziger, Tommi Rumps, NP  lisinopril (PRINIVIL,ZESTRIL) 5 MG tablet Take 1 tablet (5 mg total) by mouth daily. 03/29/18   Dorothyann Peng, NP  Family History Family History  Problem Relation Age of Onset   Arthritis Mother    Mental illness Mother    Diabetes Mother    Cancer Father        colon, lung, and prostate   Arthritis Father    Hyperlipidemia Father    Stroke Father    Heart disease Father    Hypertension Father    Colitis Father    Esophageal cancer Neg Hx    Rectal cancer Neg Hx    Stomach cancer Neg Hx     Social History Social History   Tobacco Use   Smoking status: Former Smoker    Packs/day: 1.50    Years: 47.00    Pack years: 70.50    Last attempt to quit: 07/04/2009    Years since quitting: 9.1   Smokeless tobacco: Never  Used   Tobacco comment: quit a long time ago  Substance Use Topics   Alcohol use: Yes    Comment: twice monthly; had her 1st glass of wine in 2 year    Drug use: No     Allergies   Patient has no known allergies.   Review of Systems Review of Systems  Constitutional: Negative for chills and fever.  HENT: Negative for ear pain and sore throat.   Eyes: Negative for pain and visual disturbance.  Respiratory: Negative for cough and shortness of breath.   Cardiovascular: Negative for chest pain and palpitations.  Gastrointestinal: Negative for abdominal pain and vomiting.  Genitourinary: Negative for dysuria and hematuria.  Musculoskeletal: Negative for arthralgias and back pain.  Skin: Negative for color change and rash.  Neurological: Negative for seizures, syncope and headaches.  Psychiatric/Behavioral: Positive for confusion. Negative for agitation and hallucinations.  All other systems reviewed and are negative.    Physical Exam Updated Vital Signs  ED Triage Vitals  Enc Vitals Group     BP --      Pulse Rate 08/14/18 1949 (!) 58     Resp 08/14/18 1949 16     Temp 08/14/18 1949 99.2 F (37.3 C)     Temp Source 08/14/18 1949 Oral     SpO2 08/14/18 1949 98 %     Weight 08/14/18 1951 180 lb (81.6 kg)     Height 08/14/18 1951 5\' 4"  (1.626 m)     Head Circumference --      Peak Flow --      Pain Score 08/14/18 1949 4     Pain Loc --      Pain Edu? --      Excl. in [place]? --     Physical Exam Vitals signs and nursing note reviewed.  Constitutional:      General: She is not in acute distress.    Appearance: She is well-developed. She is not ill-appearing.  HENT:     Head: Normocephalic and atraumatic.     Nose: Nose normal.  Eyes:     Extraocular Movements: Extraocular movements intact.     Conjunctiva/sclera: Conjunctivae normal.     Pupils: Pupils are equal, round, and reactive to light.  Neck:     Musculoskeletal: Neck supple.  Cardiovascular:     Rate  and Rhythm: Normal rate and regular rhythm.     Pulses: Normal pulses.     Heart sounds: Normal heart sounds. No murmur.  Pulmonary:     Effort: Pulmonary effort is normal. No respiratory distress.     Breath sounds: Normal breath sounds.  Abdominal:  Palpations: Abdomen is soft.     Tenderness: There is no abdominal tenderness.  Skin:    General: Skin is warm and dry.     Capillary Refill: Capillary refill takes less than 2 seconds.  Neurological:     General: No focal deficit present.     Mental Status: She is alert and oriented to person, place, and time.     Cranial Nerves: No cranial nerve deficit.     Sensory: No sensory deficit.     Motor: No weakness.     Coordination: Coordination normal.     Gait: Gait normal.     Comments: 5+/5 strength, normal sensation, no drift, normal finger to nose finger      ED Treatments / Results  Labs (all labs ordered are listed, but only abnormal results are displayed) Labs Reviewed  BASIC METABOLIC PANEL - Abnormal; Notable for the following components:      Result Value   CO2 20 (*)    Glucose, Bld 106 (*)    Creatinine, Ser 1.28 (*)    GFR calc non Af Amer 41 (*)    GFR calc Af Amer 48 (*)    All other components within normal limits  URINALYSIS, ROUTINE W REFLEX MICROSCOPIC - Abnormal; Notable for the following components:   Hgb urine dipstick SMALL (*)    All other components within normal limits  URINE CULTURE  CBC WITH DIFFERENTIAL/PLATELET    EKG EKG Interpretation  Date/Time:  Wednesday Aug 14 2018 19:49:07 EDT Ventricular Rate:  57 PR Interval:    QRS Duration: 87 QT Interval:  454 QTC Calculation: 443 R Axis:   45 Text Interpretation:  Sinus rhythm Borderline short PR interval Confirmed by Lennice Sites [phone]) on 08/14/2018 8:32:11 PM   Radiology Dg Chest 2 View  Result Date: 08/14/2018 CLINICAL DATA:  Altered mental status EXAM: CHEST - 2 VIEW COMPARISON:  None. FINDINGS: The heart size and mediastinal  contours are within normal limits. Both lungs are clear. The visualized skeletal structures are unremarkable. IMPRESSION: No active cardiopulmonary disease. Electronically Signed   By: Ulyses Jarred M.D.   On: 08/14/2018 20:56   Ct Head Wo Contrast  Result Date: 08/14/2018 CLINICAL DATA:  Altered LOC EXAM: CT HEAD WITHOUT CONTRAST TECHNIQUE: Contiguous axial images were obtained from the base of the skull through the vertex without intravenous contrast. COMPARISON:  MRI and CT brain 07/18/2018 FINDINGS: Brain: No acute territorial infarction, hemorrhage or intracranial mass. Mild small vessel ischemic changes of the white matter and mild atrophy. Stable ventricle size. Vascular: No hyperdense vessels.  Carotid vascular calcification. Skull: Sclerosis at the skull base unchanged.  No fracture Sinuses/Orbits: No acute finding. Other: None IMPRESSION: 1. No CT evidence for acute intracranial abnormality. 2. Atrophy and mild small vessel ischemic changes of the white matter Electronically Signed   By: Donavan Foil M.D.   On: 08/14/2018 21:10    Procedures Procedures (including critical care time)  Medications Ordered in ED Medications  acetaminophen (TYLENOL) tablet 1,000 mg (1,000 mg Oral Given 08/14/18 2247)     Initial Impression / Assessment and Plan / ED Course  I have reviewed the triage vital signs and the nursing notes.  Pertinent labs & imaging results that were available during my care of the patient were reviewed by me and considered in my medical decision making (see chart for details).     Kara Huynh is a 73 year old female history of high cholesterol, hypertension, depression who presents to the  ED with altered mental status.  Patient with unremarkable vitals.  No fever.  Patient overall appears well.  He she can tell me her name and where she is.  She is neurologically intact.  Patient states that this happened to her several months ago.  At times it appears that she tells  neighbors that her husband is still alive but he died 2 years ago.  I think there may be some underlying dementia going on.  Patient lives by herself.  Her son is in [place].  Overall she is very well-appearing.  She appears to be back at her baseline.  She seemed to improve after reminding her that her husband passed away 2 years ago.  She suspects that she has had some memory issues here recently.  She has not followed up with her primary care doctor.  Overall patient with reassuring vitals.  We will get basic lab work including head CT, chest x-ray, urinalysis.  She denies any infectious symptoms.  We will try to touch base with her son.  Overall she appears safe to be at home by herself.  Chest x-ray showed no signs of infection.  Urinalysis also negative for infection.  CT scan of the head with no acute findings.  No significant leukocytosis, anemia, electrolyte abnormality.  Creatinine at baseline.  Patient remained hemodynamically stable throughout my care.  She did have a low-grade fever 100.6.  No signs to suggest meningitis.  She denies any body aches, shortness of breath, cough.  She is educated about self isolation as this could be coronavirus.  Recommend follow-up with primary care doctor.  Discharged from ED in good condition.  Given return precautions.  This chart was dictated using voice recognition software.  Despite best efforts to proofread,  errors can occur which can change the documentation meaning.    Final Clinical Impressions(s) / ED Diagnoses   Final diagnoses:  Confusion    ED Discharge Orders    None       Lennice Sites, DO 08/14/18 2348

## 2018-08-14 NOTE — Discharge Instructions (Addendum)
Follow up with your PCP tomorrow as discussed, return to ED if symptoms worsen.

## 2018-08-14 NOTE — ED Notes (Signed)
Patient transported to X-ray 

## 2018-08-14 NOTE — ED Notes (Signed)
Pt ambulatory to bathroom without any problems 

## 2018-08-14 NOTE — ED Triage Notes (Signed)
Pt BIB [facility] from home where she lives alone. LSN yesterday at 1700. Neighbors checked on her today and noticed she wasn't answering questions appropriately. Pt oriented to self and place, disoriented to time. Denies recent falls or trauma, reports left sided headache.

## 2018-08-15 NOTE — ED Notes (Signed)
Signature pad doesn't work

## 2018-08-15 NOTE — ED Notes (Signed)
Pt discharged from ED; instructions provided; Pt encouraged to return to ED if symptoms worsen and to f/u with PCP; Pt verbalized understanding of all instructions 

## 2018-08-29 ENCOUNTER — Encounter: Payer: Medicare HMO | Admitting: Gastroenterology

## 2018-09-05 ENCOUNTER — Other Ambulatory Visit: Payer: Self-pay

## 2018-09-05 ENCOUNTER — Ambulatory Visit
Admission: RE | Admit: 2018-09-05 | Discharge: 2018-09-05 | Disposition: A | Discharge status: Home / Self Care | Payer: Medicare HMO | Source: Ambulatory Visit | Attending: Adult Health | Admitting: Adult Health

## 2018-09-05 DIAGNOSIS — Z1231 Encounter for screening mammogram for malignant neoplasm of breast: Secondary | ICD-10-CM | POA: Diagnosis not present

## 2018-09-17 ENCOUNTER — Telehealth: Payer: Self-pay

## 2018-09-17 NOTE — Telephone Encounter (Signed)
Called pt to inform that Dr. Harrell Gave will be in office tomorrow if she would like to change appointment to an in office visit. Left message to call back.

## 2018-09-18 ENCOUNTER — Telehealth: Payer: Self-pay

## 2018-09-18 ENCOUNTER — Telehealth: Payer: Medicare HMO | Admitting: Cardiology

## 2018-09-18 NOTE — Telephone Encounter (Signed)
Called patient to prechart before her 9:00 virtual visit with Dr. Harrell Gave. She stated that today was not a good day and she wants to reschedule for another time. She would like to be called back for another appointment date.

## 2018-09-24 ENCOUNTER — Telehealth: Payer: Self-pay | Admitting: *Deleted

## 2018-09-24 ENCOUNTER — Telehealth: Payer: Self-pay | Admitting: Cardiology

## 2018-09-24 NOTE — Telephone Encounter (Signed)
LVM for patient to call and reschedule her VV with Dr. Harrell Gave.

## 2018-09-24 NOTE — Telephone Encounter (Signed)
Left message forpatient to call and reschedule 09/23/18 Virtual visit with Dr. Harrell Gave

## 2018-09-26 NOTE — Telephone Encounter (Signed)
MyChart message sent to patient.

## 2018-09-26 NOTE — Telephone Encounter (Signed)
Left message for patient to call to reschedule cancelled virtual visit on 09/18/18 with Dr. Harrell Gave

## 2018-10-02 ENCOUNTER — Ambulatory Visit: Payer: Medicare HMO

## 2018-10-02 ENCOUNTER — Telehealth: Payer: Self-pay | Admitting: *Deleted

## 2018-10-02 NOTE — Telephone Encounter (Signed)
Received staff message on 09/18/18 from Isac Caddy, RN to reschedule virtual appointment with Dr. Harrell Gave.  Called patient 09/24/18--09/26/18--sent [employer] [facility] message 09/26/18 and call again on 10/02/18.  Patient has not responded to reschedule

## 2018-11-12 ENCOUNTER — Other Ambulatory Visit: Payer: Self-pay | Admitting: Adult Health

## 2018-11-12 DIAGNOSIS — F331 Major depressive disorder, recurrent, moderate: Secondary | ICD-10-CM

## 2018-11-13 NOTE — Telephone Encounter (Signed)
Sent to the pharmacy by e-scribe. 

## 2019-01-15 ENCOUNTER — Telehealth: Payer: Self-pay | Admitting: *Deleted

## 2019-01-15 NOTE — Telephone Encounter (Signed)
Unable to leave a message no voicemail

## 2019-01-23 ENCOUNTER — Telehealth: Payer: Self-pay | Admitting: *Deleted

## 2019-01-23 NOTE — Telephone Encounter (Signed)
We have call several time,phone rings with no answering machine.

## 2019-01-29 ENCOUNTER — Inpatient Hospital Stay: Admission: RE | Admit: 2019-01-29 | Payer: Medicare HMO | Source: Ambulatory Visit

## 2019-02-05 ENCOUNTER — Telehealth: Payer: Self-pay | Admitting: *Deleted

## 2019-02-11 ENCOUNTER — Telehealth: Payer: Self-pay | Admitting: Family Medicine

## 2019-02-11 NOTE — Telephone Encounter (Signed)
Spoke to [employer] (DPR on file) and he is requesting someone to come out and help his mother.  He has noticed a decline in her in general but also her memory.  Advised that if he is looking to have a nursing assistant come than that will have to be paid for out of pocket or he can contact the patient's insurance company.  Also notified him that Tommi Rumps could see her to evaluate her for home health/nursing.  Appointment scheduled to come into the office for eval and memory exam.  Will forward as FYI.

## 2019-02-11 NOTE — Telephone Encounter (Signed)
Copied from [place] [id number]. Topic: General - Inquiry >> Feb 11, 2019  2:16 PM Mathis Bud wrote: Reason for CRM: Dian Situ patients son called requesting patient to get home health visit for patient.   Call back [phone]

## 2019-03-05 ENCOUNTER — Ambulatory Visit: Payer: Medicare HMO | Admitting: Adult Health

## 2019-03-12 NOTE — Telephone Encounter (Signed)
Error

## 2019-04-07 ENCOUNTER — Other Ambulatory Visit: Payer: Self-pay | Admitting: Adult Health

## 2019-04-08 NOTE — Telephone Encounter (Signed)
Rx denied

## 2019-04-08 NOTE — Telephone Encounter (Signed)
Patient need to schedule an ov for more refills. Called pt 2x no answer no vm set up

## 2019-04-24 ENCOUNTER — Encounter (HOSPITAL_COMMUNITY): Payer: Self-pay | Admitting: Psychiatry

## 2019-04-24 ENCOUNTER — Other Ambulatory Visit: Payer: Self-pay

## 2019-04-24 ENCOUNTER — Inpatient Hospital Stay (HOSPITAL_COMMUNITY)
Admission: RE | Admit: 2019-04-24 | Discharge: 2019-05-14 | DRG: 057 | Disposition: A | Discharge status: Home / Self Care | Payer: Medicare HMO | Attending: Psychiatry | Admitting: Psychiatry

## 2019-04-24 DIAGNOSIS — E119 Type 2 diabetes mellitus without complications: Secondary | ICD-10-CM | POA: Diagnosis present

## 2019-04-24 DIAGNOSIS — Z818 Family history of other mental and behavioral disorders: Secondary | ICD-10-CM

## 2019-04-24 DIAGNOSIS — I10 Essential (primary) hypertension: Secondary | ICD-10-CM | POA: Diagnosis present

## 2019-04-24 DIAGNOSIS — Z7982 Long term (current) use of aspirin: Secondary | ICD-10-CM

## 2019-04-24 DIAGNOSIS — R05 Cough: Secondary | ICD-10-CM

## 2019-04-24 DIAGNOSIS — Z20822 Contact with and (suspected) exposure to covid-19: Secondary | ICD-10-CM | POA: Diagnosis present

## 2019-04-24 DIAGNOSIS — Z7951 Long term (current) use of inhaled steroids: Secondary | ICD-10-CM

## 2019-04-24 DIAGNOSIS — F0281 Dementia in other diseases classified elsewhere with behavioral disturbance: Secondary | ICD-10-CM | POA: Diagnosis present

## 2019-04-24 DIAGNOSIS — Z79899 Other long term (current) drug therapy: Secondary | ICD-10-CM

## 2019-04-24 DIAGNOSIS — Z96653 Presence of artificial knee joint, bilateral: Secondary | ICD-10-CM | POA: Diagnosis present

## 2019-04-24 DIAGNOSIS — R45851 Suicidal ideations: Secondary | ICD-10-CM | POA: Diagnosis present

## 2019-04-24 DIAGNOSIS — R058 Other specified cough: Secondary | ICD-10-CM

## 2019-04-24 DIAGNOSIS — G309 Alzheimer's disease, unspecified: Principal | ICD-10-CM | POA: Diagnosis present

## 2019-04-24 DIAGNOSIS — G4700 Insomnia, unspecified: Secondary | ICD-10-CM | POA: Diagnosis present

## 2019-04-24 DIAGNOSIS — F02818 Dementia in other diseases classified elsewhere, unspecified severity, with other behavioral disturbance: Secondary | ICD-10-CM

## 2019-04-24 DIAGNOSIS — F411 Generalized anxiety disorder: Secondary | ICD-10-CM | POA: Diagnosis present

## 2019-04-24 DIAGNOSIS — F329 Major depressive disorder, single episode, unspecified: Secondary | ICD-10-CM | POA: Diagnosis present

## 2019-04-24 LAB — RESPIRATORY PANEL BY RT PCR (FLU A&B, COVID)
Influenza A by PCR: NEGATIVE
Influenza B by PCR: NEGATIVE
SARS Coronavirus 2 by RT PCR: NEGATIVE

## 2019-04-24 MED ORDER — ACETAMINOPHEN 325 MG PO TABS
650.0000 mg | ORAL_TABLET | Freq: Four times a day (QID) | ORAL | Status: DC | PRN
  Administered 2019-05-06 – 2019-05-10 (×6): 650 mg via ORAL
  Filled 2019-04-24 (×6): qty 2

## 2019-04-24 MED ORDER — MAGNESIUM HYDROXIDE 400 MG/5ML PO SUSP: 30.0000 mL | Freq: Every day | ORAL | Status: DC | PRN

## 2019-04-24 MED ORDER — HYDROXYZINE HCL 25 MG PO TABS: 25.0000 mg | ORAL_TABLET | Freq: Three times a day (TID) | ORAL | Status: DC | PRN

## 2019-04-24 MED ORDER — ALUM & MAG HYDROXIDE-SIMETH 200-200-20 MG/5ML PO SUSP: 30.0000 mL | ORAL | Status: DC | PRN

## 2019-04-24 MED ORDER — TRAZODONE HCL 50 MG PO TABS
50.0000 mg | ORAL_TABLET | Freq: Every evening | ORAL | Status: DC | PRN
  Administered 2019-04-30 – 2019-05-07 (×6): 50 mg via ORAL
  Filled 2019-04-24 (×6): qty 1

## 2019-04-24 NOTE — [facility] Counselor (Signed)
[facility] CSW, Caitlyn, faxing to [facility].
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
## 2019-04-24 NOTE — Progress Notes (Signed)
Kara Huynh is a 74 y.o. female Voluntary admitted for suicide ideation with a plan to either stab herself with a knife or jump in front of oncoming cars. Pt stated her husband die three years ago due to cancer and since then, she has  been depressed, not sleeping well, increased appetite and feeling lonely. Pt stated she is not so much untouched with her family, they do not call and she does not want to call because she does not want to be a burden to them. Pt also complaining of memory loss, she forget things. This has led to her not being complaint with her medication because she gets confused as when she took her meds last. Pt is afraid of living a lone anymore.  Pt has been calm and cooperative, alert and oriented, denies SI/HI, AVH at the time of admission. Consents signed, skin/belongings search completed and pt oriented to unit. Pt stable at this time. Pt given the opportunity to express concerns and ask questions. Pt given toiletries. Will continue to monitor.

## 2019-04-24 NOTE — H&P (Signed)
Behavioral Health Medical Screening Exam  Kara Huynh is an 74 y.o. female.  Patient presents to [facility] behavioral health voluntarily for walk-in assessment.  Patient assessed by nurse practitioner.  Patient reports suicidal ideations with a plan to "run in front of traffic or take a knife and stabbed in me."  Patient denies access to weapons.  Patient denies homicidal ideations.  Patient denies auditory and visual hallucinations. Patient reports "I am alone 24 hours a day 7 days a week and nobody calls me and I will call them because I do not want to call them and be a burden."  Patient reports death of husband 3 years ago and recent loss of a brother and a nephew related to Covid "that took me over the edge."  Patient reports recent memory loss states she cannot drive as she forgets where she is going and she forgets to take her medications. Patient reports increased appetite states "this happens whenever I am depressed, all I do is eat sugar." Patient denies current psychiatrist however patient reports antidepressant prescribed by primary care physician, patient unable to remember medication name at this time.  Total Time spent with patient: 30 minutes  Psychiatric Specialty Exam: Physical Exam  Nursing note and vitals reviewed. Constitutional: She is oriented to person, place, and time. She appears well-developed.  HENT:  Head: Normocephalic.  Cardiovascular: Normal rate.  Respiratory: Effort normal.  Neurological: She is alert and oriented to person, place, and time.  Psychiatric: Her speech is normal and behavior is normal. Judgment normal. Cognition and memory are impaired. She exhibits a depressed mood. She expresses suicidal ideation. She expresses suicidal plans.    Review of Systems  Constitutional: Negative.   HENT: Negative.   Eyes: Negative.   Respiratory: Negative.   Cardiovascular: Negative.   Gastrointestinal: Negative.   Genitourinary: Negative.   Musculoskeletal:  Negative.   Skin: Negative.   Neurological: Negative.   Psychiatric/Behavioral: Positive for suicidal ideas.    There were no vitals taken for this visit.There is no height or weight on file to calculate BMI.  General Appearance: Casual and Fairly Groomed  Eye Contact:  Good  Speech:  Clear and Coherent and Normal Rate  Volume:  Normal  Mood:  Depressed  Affect:  Congruent and Depressed  Thought Process:  Coherent, Goal Directed and Descriptions of Associations: Intact  Orientation:  Full (Time, Place, and Person)  Thought Content:  Rumination  Suicidal Thoughts:  Yes.  with intent/plan  Homicidal Thoughts:  No  Memory:  Immediate;   Good Recent;   Fair Remote;   Fair  Judgement:  Fair  Insight:  Fair  Psychomotor Activity:  Normal  Concentration: Concentration: Good and Attention Span: Good  Recall:  [place] of Knowledge:Good  Language: Good  Akathisia:  NA  Handed:  Right  AIMS (if indicated):     Assets:  Communication Skills Desire for Improvement Financial Resources/Insurance Housing Leisure Time Physical Health Resilience  Sleep:       Musculoskeletal: Strength & Muscle Tone: within normal limits Gait & Station: normal Patient leans: N/A  There were no vitals taken for this visit.  Recommendations: Recommend inpatient geriatric psychiatric admission.  Based on my evaluation the patient does not appear to have an emergency medical condition.  Emmaline Kluver, FNP 04/24/2019, 4:32 PM

## 2019-04-24 NOTE — Progress Notes (Signed)
Patient meets inpatient criteria per Letitia Libra, FNP. Patient has been faxed out to the following facilities for review:   [place]  CSW will continue to follow and assist with bed placement.   Domenic Schwab, MSW, LCSW-A Clinical Disposition Social Worker [facility]/TTS [phone]

## 2019-04-24 NOTE — Plan of Care (Signed)
[place] Observation Crisis Plan  Reason for Crisis Plan:  Crisis Stabilization   Plan of Care:  Referral for Inpatient Hospitalization  Family Support:      Current Living Environment:  Living Arrangements: Alone  Insurance:   Hospital Account    Name Acct ID Class Status Primary Coverage   Sheana, Andary [id number] [place]        Guarantor Account (for Hospital Account [id number])    Name Relation to Pt Service Area Active? Acct Type   Lester Kinsman Self [facility] Yes Behavioral Health   Address Phone       [address] [place], [place] [address] [phone])          Coverage Information (for Hospital Account [id number])    F/O Payor/Plan Precert #   [facility] [place] #   [place], [place] [facility]   Address Phone   [address] [address] [phone]      Legal Guardian:  Legal Guardian: (drlg)  Primary Care Provider:  Dorothyann Peng, NP  Current Outpatient Providers:  pt not sure of outside provider  Psychiatrist:  Name of Psychiatrist: none  Counselor/Therapist:  Name of Therapist: none  Compliant with Medications:  No  Additional Information:   Wolfgang Phoenix 2/4/202110:32 PM

## 2019-04-25 DIAGNOSIS — F332 Major depressive disorder, recurrent severe without psychotic features: Secondary | ICD-10-CM | POA: Diagnosis not present

## 2019-04-25 DIAGNOSIS — Z7982 Long term (current) use of aspirin: Secondary | ICD-10-CM | POA: Diagnosis not present

## 2019-04-25 DIAGNOSIS — R05 Cough: Secondary | ICD-10-CM | POA: Diagnosis not present

## 2019-04-25 DIAGNOSIS — Z96653 Presence of artificial knee joint, bilateral: Secondary | ICD-10-CM | POA: Diagnosis present

## 2019-04-25 DIAGNOSIS — Z20822 Contact with and (suspected) exposure to covid-19: Secondary | ICD-10-CM | POA: Diagnosis not present

## 2019-04-25 DIAGNOSIS — R69 Illness, unspecified: Secondary | ICD-10-CM | POA: Diagnosis not present

## 2019-04-25 DIAGNOSIS — Z79899 Other long term (current) drug therapy: Secondary | ICD-10-CM | POA: Diagnosis not present

## 2019-04-25 DIAGNOSIS — Z818 Family history of other mental and behavioral disorders: Secondary | ICD-10-CM | POA: Diagnosis not present

## 2019-04-25 DIAGNOSIS — G4700 Insomnia, unspecified: Secondary | ICD-10-CM | POA: Diagnosis not present

## 2019-04-25 DIAGNOSIS — F329 Major depressive disorder, single episode, unspecified: Secondary | ICD-10-CM | POA: Diagnosis not present

## 2019-04-25 DIAGNOSIS — G309 Alzheimer's disease, unspecified: Secondary | ICD-10-CM | POA: Diagnosis not present

## 2019-04-25 DIAGNOSIS — F0281 Dementia in other diseases classified elsewhere with behavioral disturbance: Secondary | ICD-10-CM | POA: Diagnosis not present

## 2019-04-25 DIAGNOSIS — I10 Essential (primary) hypertension: Secondary | ICD-10-CM | POA: Diagnosis not present

## 2019-04-25 DIAGNOSIS — F411 Generalized anxiety disorder: Secondary | ICD-10-CM | POA: Diagnosis not present

## 2019-04-25 DIAGNOSIS — R45851 Suicidal ideations: Secondary | ICD-10-CM | POA: Diagnosis not present

## 2019-04-25 DIAGNOSIS — G301 Alzheimer's disease with late onset: Secondary | ICD-10-CM | POA: Diagnosis not present

## 2019-04-25 DIAGNOSIS — E119 Type 2 diabetes mellitus without complications: Secondary | ICD-10-CM | POA: Diagnosis present

## 2019-04-25 DIAGNOSIS — Z7951 Long term (current) use of inhaled steroids: Secondary | ICD-10-CM | POA: Diagnosis not present

## 2019-04-25 LAB — CBC WITH DIFFERENTIAL/PLATELET
Abs Immature Granulocytes: 0.02 10*3/uL (ref 0.00–0.07)
Basophils Absolute: 0 10*3/uL (ref 0.0–0.1)
Basophils Relative: 1 %
Eosinophils Absolute: 0.2 10*3/uL (ref 0.0–0.5)
Eosinophils Relative: 4 %
HCT: 39 % (ref 36.0–46.0)
Hemoglobin: 12.6 g/dL (ref 12.0–15.0)
Immature Granulocytes: 0 %
Lymphocytes Relative: 33 %
Lymphs Abs: 1.9 10*3/uL (ref 0.7–4.0)
MCH: 31.1 pg (ref 26.0–34.0)
MCHC: 32.3 g/dL (ref 30.0–36.0)
MCV: 96.3 fL (ref 80.0–100.0)
Monocytes Absolute: 0.3 10*3/uL (ref 0.1–1.0)
Monocytes Relative: 5 %
Neutro Abs: 3.3 10*3/uL (ref 1.7–7.7)
Neutrophils Relative %: 57 %
Platelets: 236 10*3/uL (ref 150–400)
RBC: 4.05 MIL/uL (ref 3.87–5.11)
RDW: 14.1 % (ref 11.5–15.5)
WBC: 5.8 10*3/uL (ref 4.0–10.5)
nRBC: 0 % (ref 0.0–0.2)

## 2019-04-25 LAB — LIPID PANEL
Cholesterol: 174 mg/dL (ref 0–200)
HDL: 57 mg/dL (ref >40)
LDL Cholesterol: 93 mg/dL (ref 0–99)
Total CHOL/HDL Ratio: 3.1 RATIO
Triglycerides: 119 mg/dL (ref <150)
VLDL: 24 mg/dL (ref 0–40)

## 2019-04-25 LAB — COMPREHENSIVE METABOLIC PANEL
ALT: 21 U/L (ref 0–44)
AST: 23 U/L (ref 15–41)
Albumin: 4 g/dL (ref 3.5–5.0)
Alkaline Phosphatase: 60 U/L (ref 38–126)
Anion gap: 10 (ref 5–15)
BUN: 21 mg/dL (ref 8–23)
CO2: 28 mmol/L (ref 22–32)
Calcium: 9 mg/dL (ref 8.9–10.3)
Chloride: 103 mmol/L (ref 98–111)
Creatinine, Ser: 1.49 mg/dL — ABNORMAL HIGH (ref 0.44–1.00)
GFR calc Af Amer: 39 mL/min — ABNORMAL LOW (ref >60)
GFR calc non Af Amer: 34 mL/min — ABNORMAL LOW (ref >60)
Glucose, Bld: 144 mg/dL — ABNORMAL HIGH (ref 70–99)
Potassium: 4 mmol/L (ref 3.5–5.1)
Sodium: 141 mmol/L (ref 135–145)
Total Bilirubin: 0.6 mg/dL (ref 0.3–1.2)
Total Protein: 6.9 g/dL (ref 6.5–8.1)

## 2019-04-25 LAB — TSH: TSH: 1.296 u[IU]/mL (ref 0.350–4.500)

## 2019-04-25 LAB — HEMOGLOBIN A1C
Hgb A1c MFr Bld: 5.9 % — ABNORMAL HIGH (ref 4.8–5.6)
Mean Plasma Glucose: 122.63 mg/dL

## 2019-04-25 MED ORDER — LEVOTHYROXINE SODIUM 25 MCG PO TABS
25.0000 ug | ORAL_TABLET | Freq: Every day | ORAL | Status: DC
  Administered 2019-04-25 – 2019-05-14 (×17): 25 ug via ORAL
  Filled 2019-04-25 (×22): qty 1

## 2019-04-25 MED ORDER — HYDROXYZINE HCL 10 MG PO TABS
10.0000 mg | ORAL_TABLET | Freq: Three times a day (TID) | ORAL | Status: DC | PRN
  Administered 2019-04-26 – 2019-05-07 (×10): 10 mg via ORAL
  Filled 2019-04-25 (×10): qty 1

## 2019-04-25 MED ORDER — FLUOXETINE HCL 20 MG PO CAPS
20.0000 mg | ORAL_CAPSULE | Freq: Every day | ORAL | Status: DC
  Administered 2019-04-25 – 2019-04-29 (×5): 20 mg via ORAL
  Filled 2019-04-25 (×7): qty 1

## 2019-04-25 MED ORDER — ATORVASTATIN CALCIUM 20 MG PO TABS
20.0000 mg | ORAL_TABLET | Freq: Every day | ORAL | Status: DC
  Administered 2019-04-25 – 2019-05-13 (×19): 20 mg via ORAL
  Filled 2019-04-25 (×20): qty 1
  Filled 2019-04-25: qty 2
  Filled 2019-04-25: qty 1

## 2019-04-25 MED ORDER — ASPIRIN 81 MG PO TBEC
81.0000 mg | DELAYED_RELEASE_TABLET | Freq: Every day | ORAL | Status: DC
  Administered 2019-04-25 – 2019-05-14 (×20): 81 mg via ORAL
  Filled 2019-04-25 (×23): qty 1

## 2019-04-25 NOTE — Progress Notes (Signed)
Mayer Camel is reviewing this pt and requests that labs be completed on pt. CSW will notify [facility] NP.   Audree Camel, LCSW, [place] Disposition [facility]/TTS [phone]

## 2019-04-25 NOTE — Progress Notes (Signed)
Patient ID: Kara Huynh, female   DOB: [birth year], 74 y.o.   MRN: [id number]  Observation unit progress note 04/25/2019 at 62 AM  74 year old widowed female (husband passed away 3 years ago), lives alone, has 1 adult son with whom she has limited contact, on social security as source of income . She  presented to [facility] reporting worsening depression, suicidal thoughts, worsening memory . She reports feeling sad most of the time, endorses neurovegetative symptoms of depression to include anhedonia, erratic sleep and appetite.  She reports she has had suicidal ideations with thoughts of walking into traffic or stabbing herself.  She denies psychotic symptoms. She reports she has been more socially isolated, not cleaning her home as she used to, staying in bed most of the day, .  States "I have been crying a lot too". She is concerned about her memory.  States she is becoming increasingly forgetful.  She reports she has gotten lost when going to the supermarket. She reports history of depression with a prior psychiatric admission "  A long time ago" for depression. Denies history of suicide attempts or of psychosis. Denies history of mania or hypomania.  She reports history of hypertension ( BP today 126/75, pulse 67) . NKDA. She reports she is prescribed medications but does not remember names at this time other than ASA.  Home medications listed as ASA 81 mgr ADAY, Lipitor 20 mgrs QDAY, Prozac 40 mgrs QDAY, Synthroid 25 micrograms QDAY, Lisinopril 5 mgrs QDAY   MSE- Alert, attentive, well groomed in hospital garb, no psychomotor agitation, reports feeling depressed, affect is labile and tearful at times during session. No thought disorder, endorses recent suicidal ideations as above, denies current and contracts for safety at this time. Denies homicidal ideations, no hallucinations, no delusions, not internally preoccupied .  She is fully alert and attentive and is oriented x 3. Able to spell WORLD  forwards, backwards without difficulty, able to name common objects without difficulty and no aphasia noted , able to follow three stage command without difficulty, recall is 3/3 immediate , 0/3 at 5 minutes ( 1/3 with cues) .  Dx- MDD, Consider Early Dementia among differential diagnosis as well .  Plan- Patient will benefit from gero psychiatric unit admission for management of depression, dementia work up and treatment as needed . As per home medication list she is on Prozac 40 mgrs QDAY but currently cannot confirm. Will continue Prozac 20 mgrs QDAY for now. Continue Lipitor, Synthroid at doses listed . Will not restart Lisinopril at this time as it appears she may not have been taking it and her BP is WNL this AM. CMP, CBC, Lipid Panel, HgbA1C, TSH, RPR, B12   F Lynora Dymond MD
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
## 2019-04-25 NOTE — BHH Group Notes (Signed)
Type of Therapy/Topic: Identifying Irrational Beliefs/Thoughts  Participation Level: Active  Description of Group: The purpose of this group is to assist patients in learning to identify irrational beliefs and thoughts that contribute to their negative emotions and experience positive emotions. Patients will be guided to discuss ways in which they have been effected by irrational thoughts and beliefs and how to transform those irrational beliefs into rational ones. Newly identified rational beliefs will be juxtaposed with experiences of positive emotions or situations, and patients will be challenged to use rational beliefs or thoughts to combat negative ones. Special emphasis will be placed on coping with irrational beliefs in conflict situations, and patients will process healthy conflict resolution skills.  Therapeutic Goals: 1. Patient will identify two irrational thoughts or beliefs  to reflect on in order to balance out those thoughts 2. Patient will label two or more irrational thoughts/beliefs that they find the most difficult to cope with 3. Patient will demonstrate positive conflict resolution skills through discussion and/or role plays that will assist in transforming irrational thoughts or beliefs into positive ones.  Summary of Patient Progress:  Due to the COVID-19 Pandemic, this group was supplemented with a worksheet and direct patient feedback.   Therapeutic Modalities: Cognitive Behavioral Therapy Feelings Identification Dialectical Behavioral Therapy

## 2019-04-25 NOTE — BH Assessment (Signed)
[place] Assessment Progress Note  Per Hampton Abbot, MD, this voluntary pt requires psychiatric hospitalization at this time.  Jasmine has assigned pt to [facility] Rm 302-1.  Pt's nurse, Benjamine Mola, has been notified.  Jalene Mullet, [place] Coordinator [phone]

## 2019-04-25 NOTE — Progress Notes (Signed)
DAR NOTE: Patient presents with anxious affect and mood.  Denies suicidal thoughts, pain, auditory and visual hallucinations.   Maintained on routine safety checks.  Medications given as prescribed.  Support and encouragement offered as needed.   Patient visible in milieu interacting with staff and peers.  Patient is safe on the unit.  Patient transferred to 300 hall for inpatient admission.  Verbalizes understanding of unit rules and protocols.  Offered no complaint. Reports given to RN Susy Manor.

## 2019-04-25 NOTE — BHH Suicide Risk Assessment (Signed)
[facility] Admission Suicide Risk Assessment   Nursing information obtained from:  Patient Demographic factors:  Unemployed, Living alone, Divorced or widowed, Age 74 or older Current Mental Status:  NA Loss Factors:  Loss of significant relationship, Decline in physical health Historical Factors:  NA Risk Reduction Factors:  Responsible for children under 55 years of age  Total Time spent with patient: 45 minutes Principal Problem: MDD Diagnosis:  Active Problems:   MDD (major depressive disorder)  Subjective Data:   Continued Clinical Symptoms:  Alcohol Use Disorder Identification Test Final Score (AUDIT): 0 The "Alcohol Use Disorders Identification Test", Guidelines for Use in Primary Care, Second Edition.  World Pharmacologist [facility]). Score between 0-7:  no or low risk or alcohol related problems. Score between 8-15:  moderate risk of alcohol related problems. Score between 16-19:  high risk of alcohol related problems. Score 20 or above:  warrants further diagnostic evaluation for alcohol dependence and treatment.   CLINICAL FACTORS:  74 year old widowed female (husband passed away 3 years ago), lives alone, has 1 adult son with whom she has limited contact, on [employer] farm [employer].  Presented for worsening depression, suicidal ideations.  With recent thoughts of wanting to traffic or stabbing self, neurovegetative symptoms, social isolation and decreased self-care.  She expressed concern regarding memory loss over recent months and states that she recently had an episode where she got lost while going to a supermarket.   Psychiatric Specialty Exam: Physical Exam  Review of Systems  Blood pressure 126/75, pulse 67, temperature 98.5 F (36.9 C), temperature source Oral, resp. rate 18, height 5\' 5"  (1.651 m), weight 84.4 kg, SpO2 96 %.Body mass index is 30.95 kg/m.  See admit note MSE    COGNITIVE FEATURES THAT CONTRIBUTE TO RISK:  Closed-mindedness and Loss of executive function     SUICIDE RISK:   Moderate:  Frequent suicidal ideation with limited intensity, and duration, some specificity in terms of plans, no associated intent, good self-control, limited dysphoria/symptomatology, some risk factors present, and identifiable protective factors, including available and accessible social support.  PLAN OF CARE: Patient will be admitted to inpatient psychiatric unit for stabilization and safety. Will provide and encourage milieu participation. Provide medication management and maked adjustments as needed.  Will follow daily.    I certify that inpatient services furnished can reasonably be expected to improve the patient's condition.   Jenne Campus, MD 04/25/2019, 3:19 PM

## 2019-04-26 LAB — RPR: RPR Ser Ql: NONREACTIVE

## 2019-04-26 MED ORDER — DONEPEZIL HCL 5 MG PO TABS
5.0000 mg | ORAL_TABLET | Freq: Every day | ORAL | Status: DC
  Filled 2019-04-26: qty 1

## 2019-04-26 NOTE — Progress Notes (Signed)
   04/25/19 2115  Psych Admission Type (Psych Patients Only)  Admission Status Voluntary  Psychosocial Assessment  Patient Complaints Anxiety  Eye Contact Fair  Facial Expression Worried  Affect Anxious  Speech Logical/coherent  Interaction Assertive  Motor Activity Hyperactive  Appearance/Hygiene In hospital gown;Improved  Behavior Characteristics Appropriate to situation;Anxious  Mood Anxious;Pleasant  Thought Process  Coherency WDL  Content WDL  Delusions None reported or observed  Perception WDL  Hallucination None reported or observed  Judgment WDL  Confusion None  Danger to Self  Current suicidal ideation? Denies  Danger to Others  Danger to Others None reported or observed

## 2019-04-26 NOTE — Progress Notes (Addendum)
[facility] MD Progress Note  04/26/2019 11:20 AM Kara Huynh  MRN:  [id number] Subjective: Patient assessed by nurse practitioner.  Patient states "I feel very sad and lonely, I am wondering how I ended up like this?" Patient states "my husband is very intelligent and I do not know why he died and I did not I am just taking up space."  Patient reports "my sleep was not good and my appetite never goes away, I have gained 10 pounds." Patient reports "one of my biggest problems is my memory but I do not forget all the bad stuff." Principal Problem: MDD (major depressive disorder) Diagnosis: Principal Problem:   MDD (major depressive disorder)  Total Time spent with patient: 20 minutes  Past Psychiatric History: Depression  Past Medical History:  Past Medical History:  Diagnosis Date  . Anxiety   . Arthritis   . Cancer ([place])    uterine  . Depression   . Endometrial polyp 02-17-13   11'14 Dx. endometrial cancer  . Hyperlipidemia   . Hypertension   . Hypothyroidism     Past Surgical History:  Procedure Laterality Date  . APPENDECTOMY    . BREAST SURGERY     biopsy  . CATARACT EXTRACTION, BILATERAL Bilateral 02-17-13   bilateral  . DILITATION & CURRETTAGE/HYSTROSCOPY WITH VERSAPOINT RESECTION N/A 01/31/2013   Procedure: DILATATION & CURETTAGE/HYSTEROSCOPY WITH CERVICAL BLOCK;  Surgeon: Marylynn Pearson, MD;  Location: [facility];  Service: Gynecology;  Laterality: N/A;  . LAPAROSCOPY FOR ECTOPIC PREGNANCY  1977   SALPINGOSTOMY (SIDE UNKNOWN)  . LAPAROTOMY Bilateral 02/18/2013   Procedure: EXPLORATORY LAPAROTOMY TOTAL ABDMONIAL HYSTERECTOMY BILATERAL SALPINGO OOPHORECTOMY ;  Surgeon: Alvino Chapel, MD;  Location: [facility];  Service: Gynecology;  Laterality: Bilateral;  . TOTAL ABDOMINAL HYSTERECTOMY W/ BILATERAL SALPINGOOPHORECTOMY    . TOTAL KNEE ARTHROPLASTY Bilateral    Family History:  Family History  Problem Relation Age of Onset  . Arthritis Mother   . Mental illness Mother    . Diabetes Mother   . Cancer Father        colon, lung, and prostate  . Arthritis Father   . Hyperlipidemia Father   . Stroke Father   . Heart disease Father   . Hypertension Father   . Colitis Father   . Esophageal cancer Neg Hx   . Rectal cancer Neg Hx   . Stomach cancer Neg Hx   . Breast cancer Neg Hx    Family Psychiatric  History: Denies Social History:  Social History   Substance and Sexual Activity  Alcohol Use Yes   Comment: twice monthly; had her 1st glass of wine in 2 year      Social History   Substance and Sexual Activity  Drug Use No    Social History   Socioeconomic History  . Marital status: Widowed    Spouse name: Not on file  . Number of children: Not on file  . Years of education: Not on file  . Highest education level: Not on file  Occupational History  . Not on file  Tobacco Use  . Smoking status: Former Smoker    Packs/day: 1.50    Years: 47.00    Pack years: 70.50    Quit date: 07/04/2009    Years since quitting: 9.8  . Smokeless tobacco: Never Used  . Tobacco comment: quit a long time ago  Substance and Sexual Activity  . Alcohol use: Yes    Comment: twice monthly; had her 1st glass of  wine in 2 year   . Drug use: No  . Sexual activity: Yes    Partners: Male    Birth control/protection: Post-menopausal  Other Topics Concern  . Not on file  Social History Narrative   Retired from being an Web designer   Married for 35 years   3 step children and 1 adopted child. They live all over the [name]   Social Determinants of Health   Financial Resource Strain:   . Difficulty of Paying Living Expenses: Not on file  Food Insecurity:   . Worried About Charity fundraiser in the Last Year: Not on file  . Ran Out of Food in the Last Year: Not on file  Transportation Needs:   . Lack of Transportation (Medical): Not on file  . Lack of Transportation (Non-Medical): Not on file  Physical Activity:   . Days of Exercise per Week: Not  on file  . Minutes of Exercise per Session: Not on file  Stress:   . Feeling of Stress : Not on file  Social Connections:   . Frequency of Communication with Friends and Family: Not on file  . Frequency of Social Gatherings with Friends and Family: Not on file  . Attends Religious Services: Not on file  . Active Member of Clubs or Organizations: Not on file  . Attends Archivist Meetings: Not on file  . Marital Status: Not on file   Additional Social History:    Pain Medications: see MAR Prescriptions: see MAR Over the Counter: see MAR History of alcohol / drug use?: No history of alcohol / drug abuse                    Sleep: Poor  Appetite:  Good  Current Medications: Current Facility-Administered Medications  Medication Dose Route Frequency Provider Last Rate Last Admin  . acetaminophen (TYLENOL) tablet 650 mg  650 mg Oral Q6H PRN Dixon, Rashaun M, NP      . alum & mag hydroxide-simeth (MAALOX/MYLANTA) 200-200-20 MG/5ML suspension 30 mL  30 mL Oral Q4H PRN Dixon, Rashaun M, NP      . aspirin EC tablet 81 mg  81 mg Oral Daily Adriena Manfre, Myer Peer, MD   81 mg at 04/26/19 0839  . atorvastatin (LIPITOR) tablet 20 mg  20 mg Oral q1800 Kimberla Driskill, Myer Peer, MD   20 mg at 04/25/19 1735  . FLUoxetine (PROZAC) capsule 20 mg  20 mg Oral Daily Enid Maultsby A, MD   20 mg at 04/26/19 [id number]  . hydrOXYzine (ATARAX/VISTARIL) tablet 10 mg  10 mg Oral TID PRN Sadeel Fiddler, Myer Peer, MD      . levothyroxine (SYNTHROID) tablet 25 mcg  25 mcg Oral Q0600 Deanna Wiater, Myer Peer, MD   25 mcg at 04/26/19 [phone]  . magnesium hydroxide (MILK OF MAGNESIA) suspension 30 mL  30 mL Oral Daily PRN Dixon, Rashaun M, NP      . traZODone (DESYREL) tablet 50 mg  50 mg Oral QHS PRN Deloria Lair, NP        Lab Results:  Results for orders placed or performed during the hospital encounter of 04/24/19 (from the past 48 hour(s))  Respiratory Panel by RT PCR (Flu A&B, Covid) - Nasopharyngeal Swab     Status:  None   Collection Time: 04/24/19  4:59 PM   Specimen: Nasopharyngeal Swab  Result Value Ref Range   SARS Coronavirus 2 by RT PCR NEGATIVE NEGATIVE    Comment: (NOTE)  SARS-CoV-2 target nucleic acids are NOT DETECTED. The SARS-CoV-2 RNA is generally detectable in upper respiratoy specimens during the acute phase of infection. The lowest concentration of SARS-CoV-2 viral copies this assay can detect is 131 copies/mL. A negative result does not preclude SARS-Cov-2 infection and should not be used as the sole basis for treatment or other patient management decisions. A negative result may occur with  improper specimen collection/handling, submission of specimen other than nasopharyngeal swab, presence of viral mutation(s) within the areas targeted by this assay, and inadequate number of viral copies (<131 copies/mL). A negative result must be combined with clinical observations, patient history, and epidemiological information. The expected result is Negative. Fact Sheet for Patients:  PinkCheek.be Fact Sheet for Healthcare Providers:  GravelBags.it This test is not yet ap proved or cleared by the [place] FDA and  has been authorized for detection and/or diagnosis of SARS-CoV-2 by FDA under an Emergency Use Authorization (EUA). This EUA will remain  in effect (meaning this test can be used) for the duration of the COVID-19 declaration under Section 564(b)(1) of the Act, 21 U.S.C. section 360bbb-3(b)(1), unless the authorization is terminated or revoked sooner.    Influenza A by PCR NEGATIVE NEGATIVE   Influenza B by PCR NEGATIVE NEGATIVE    Comment: (NOTE) The Xpert Xpress SARS-CoV-2/FLU/RSV assay is intended as an aid in  the diagnosis of influenza from Nasopharyngeal swab specimens and  should not be used as a sole basis for treatment. Nasal washings and  aspirates are unacceptable for Xpert Xpress SARS-CoV-2/FLU/RSV   testing. Fact Sheet for Patients: PinkCheek.be Fact Sheet for Healthcare Providers: GravelBags.it This test is not yet approved or cleared by the [place] FDA and  has been authorized for detection and/or diagnosis of SARS-CoV-2 by  FDA under an Emergency Use Authorization (EUA). This EUA will remain  in effect (meaning this test can be used) for the duration of the  Covid-19 declaration under Section 564(b)(1) of the Act, 21  U.S.C. section 360bbb-3(b)(1), unless the authorization is  terminated or revoked. Performed at [facility], [place] [address] [place], [place] [address]   Comprehensive metabolic panel     Status: Abnormal   Collection Time: 04/25/19  6:13 PM  Result Value Ref Range   Sodium 141 135 - 145 mmol/L   Potassium 4.0 3.5 - 5.1 mmol/L   Chloride 103 98 - 111 mmol/L   CO2 28 22 - 32 mmol/L   Glucose, Bld 144 (H) 70 - 99 mg/dL   BUN 21 8 - 23 mg/dL   Creatinine, Ser 1.49 (H) 0.44 - 1.00 mg/dL   Calcium 9.0 8.9 - 10.3 mg/dL   Total Protein 6.9 6.5 - 8.1 g/dL   Albumin 4.0 3.5 - 5.0 g/dL   AST 23 15 - 41 U/L   ALT 21 0 - 44 U/L   Alkaline Phosphatase 60 38 - 126 U/L   Total Bilirubin 0.6 0.3 - 1.2 mg/dL   GFR calc non Af Amer 34 (L) >60 mL/min   GFR calc Af Amer 39 (L) >60 mL/min   Anion gap 10 5 - 15    Comment: Performed at [facility], [place] [address], [place], [place] [address]  CBC with Differential/Platelet     Status: None   Collection Time: 04/25/19  6:13 PM  Result Value Ref Range   WBC 5.8 4.0 - 10.5 K/uL   RBC 4.05 3.87 - 5.11 MIL/uL   Hemoglobin 12.6 12.0 - 15.0  g/dL   HCT 39.0 36.0 - 46.0 %   MCV 96.3 80.0 - 100.0 fL   MCH 31.1 26.0 - 34.0 pg   MCHC 32.3 30.0 - 36.0 g/dL   RDW 14.1 11.5 - 15.5 %   Platelets 236 150 - 400 K/uL   nRBC 0.0 0.0 - 0.2 %   Neutrophils Relative % 57 %   Neutro Abs 3.3 1.7 - 7.7 K/uL   Lymphocytes Relative 33 %    Lymphs Abs 1.9 0.7 - 4.0 K/uL   Monocytes Relative 5 %   Monocytes Absolute 0.3 0.1 - 1.0 K/uL   Eosinophils Relative 4 %   Eosinophils Absolute 0.2 0.0 - 0.5 K/uL   Basophils Relative 1 %   Basophils Absolute 0.0 0.0 - 0.1 K/uL   Immature Granulocytes 0 %   Abs Immature Granulocytes 0.02 0.00 - 0.07 K/uL    Comment: Performed at [facility], [place] [address], [place], [place] [address]  Hemoglobin A1c     Status: Abnormal   Collection Time: 04/25/19  6:13 PM  Result Value Ref Range   Hgb A1c MFr Bld 5.9 (H) 4.8 - 5.6 %    Comment: (NOTE) Pre diabetes:          5.7%-6.4% Diabetes:              >6.4% Glycemic control for   <7.0% adults with diabetes    Mean Plasma Glucose 122.63 mg/dL    Comment: Performed at [facility] [address] [place], [place] [address]  TSH     Status: None   Collection Time: 04/25/19  6:13 PM  Result Value Ref Range   TSH 1.296 0.350 - 4.500 uIU/mL    Comment: Performed by a 3rd Generation assay with a functional sensitivity of <=0.01 uIU/mL. Performed at [facility], [place] [address], [place], [place] [address]   Lipid panel     Status: None   Collection Time: 04/25/19  6:13 PM  Result Value Ref Range   Cholesterol 174 0 - 200 mg/dL   Triglycerides 119 <150 mg/dL   HDL 57 >40 mg/dL   Total CHOL/HDL Ratio 3.1 RATIO   VLDL 24 0 - 40 mg/dL   LDL Cholesterol 93 0 - 99 mg/dL    Comment:        Total Cholesterol/HDL:CHD Risk Coronary Heart Disease Risk Table                     Men   Women  1/2 Average Risk   3.4   3.3  Average Risk       5.0   4.4  2 X Average Risk   9.6   7.1  3 X Average Risk  23.4   11.0        Use the calculated Patient Ratio above and the CHD Risk Table to determine the patient's CHD Risk.        ATP III CLASSIFICATION (LDL):  <100     mg/dL   Optimal  100-129  mg/dL   Near or Above                    Optimal  130-159  mg/dL   Borderline  160-189  mg/dL   High  >190      mg/dL   Very High Performed at [place] [address], [place], [place] [address]     Blood Alcohol level:  No results found for: [facility]  Metabolic Disorder Labs: Lab Results  Component Value Date   HGBA1C 5.9 (H) 04/25/2019   MPG 122.63 04/25/2019   No results found for: PROLACTIN Lab Results  Component Value Date   CHOL 174 04/25/2019   TRIG 119 04/25/2019   HDL 57 04/25/2019   CHOLHDL 3.1 04/25/2019   VLDL 24 04/25/2019   LDLCALC 93 04/25/2019   LDLCALC 92 05/07/2018    Physical Findings: AIMS:  , ,  ,  ,    CIWA:    COWS:     Musculoskeletal: Strength & Muscle Tone: within normal limits Gait & Station: normal Patient leans: N/A  Psychiatric Specialty Exam: Physical Exam  Nursing note and vitals reviewed. Constitutional: She is oriented to person, place, and time. She appears well-developed.  HENT:  Head: Normocephalic.  Cardiovascular: Normal rate.  Respiratory: Effort normal.  Musculoskeletal:        General: Normal range of motion.     Cervical back: Normal range of motion.  Neurological: She is alert and oriented to person, place, and time.  Psychiatric: Her speech is normal and behavior is normal. Judgment and thought content normal. Cognition and memory are impaired. She exhibits a depressed mood.    Review of Systems  Constitutional: Negative.   HENT: Negative.   Eyes: Negative.   Respiratory: Negative.   Cardiovascular: Negative.   Gastrointestinal: Negative.   Genitourinary: Negative.   Musculoskeletal: Negative.   Skin: Negative.   Neurological: Negative.     Blood pressure 113/79, pulse 76, temperature 98.2 F (36.8 C), temperature source Oral, resp. rate 18, height 5\' 5"  (1.651 m), weight 84.4 kg, SpO2 96 %.Body mass index is 30.95 kg/m.  General Appearance: Casual and Fairly Groomed  Eye Contact:  Good  Speech:  Clear and Coherent and Normal Rate  Volume:  Normal  Mood:  Depressed  Affect:  Depressed  Thought  Process:  Coherent, Goal Directed and Descriptions of Associations: Intact  Orientation:  Full (Time, Place, and Person)  Thought Content:  Rumination  Suicidal Thoughts:  No  Homicidal Thoughts:  No  Memory:  Immediate;   Fair Recent;   Fair Remote;   Fair  Judgement:  Good  Insight:  Fair  Psychomotor Activity:  Normal  Concentration:  Concentration: Good and Attention Span: Good  Recall:  Good  Fund of Knowledge:  Good  Language:  Good  Akathisia:  No  Handed:  Right  AIMS (if indicated):     Assets:  Communication Skills Desire for Improvement Financial Resources/Insurance Housing Intimacy Leisure Time Physical Health Resilience  ADL's:  Intact  Cognition:  WNL  Sleep:  Number of Hours: 5.5     Treatment Plan Summary: Daily contact with patient to assess and evaluate symptoms and progress in treatment and Medication management.  Initiate 5mg  Aricept, memory loss Case discussed with Dr Parke Poisson  Emmaline Kluver, FNP 04/26/2019, 11:20 AM    Patient seen , case reviewed with Ms. Hall Busing ( NP)  50 y old female, presented for worsening depression, suicidal ideations , neuro-vegetative symptoms and reported cognitive decline, particularly regarding short term memory. Today patient presents alert, attentive, pleasant on approach. Remembers Probation officer from yesterday. States she is feeling better today in the context of being " around people". Denies SI at this time.Affect remains somewhat labile, but improved overall . No HI. No psychotic symptoms endorsed or noted  No disruptive or agitated behaviors. Ms Hall Busing and Probation officer encouraged her to consider medication management (  such as Aricept) to help address memory loss concerns, but at this time patient declines . States " I feel like I am already on to many medications as it is". Denies side effects. Labs reviewed- Creatinine 1.49 ( 1.35 in April 2020) , BUN 21, Lipid panel unremarkable, CBC unremarkable , HgbA1C 5.9, TSH 1.296, RPR Non  reactive  EKG NSR HR 64, QTc 447 Dx- MDD Plan- Continue inpatient treatment Continue Prozac 20 mgrs QDAY .  Continue Lipitor 20 mgrs QDAY, Synthroid 25 micrograms QDAY, ASA 81 mgr QDAY   F Takuma Cifelli MD

## 2019-04-26 NOTE — [facility] Group Notes (Signed)
Adult Psychoeducational Group Note  Date:  04/26/2019 Time:  12:22 PM  Group Topic/Focus:  Goals Group:   The focus of this group is to help patients establish daily goals to achieve during treatment and discuss how the patient can incorporate goal setting into their daily lives to aide in recovery.  Participation Level:  Did Not Attend   Paulino Rily 04/26/2019, 12:22 PM

## 2019-04-26 NOTE — [facility] Group Notes (Signed)
LCSW Group Therapy Note  04/26/2019    10:00-11:00am   Type of Therapy and Topic:  Group Therapy: Anger and Coping Skills  Participation Level:  Active   Description of Group:   In this group, patients learned how to recognize the physical, cognitive, emotional, and behavioral responses they have to anger-provoking situations.  They identified how they usually or often react when angered, and learned how healthy and unhealthy coping skills work initially, but the unhealthy ones stop working.   They analyzed how their frequently-chosen coping skill is possibly beneficial and how it is possibly unhelpful.  The group discussed a variety of healthier coping skills that could help in resolving the actual issues, as well as how to go about planning for the the possibility of future similar situations.  Therapeutic Goals: Patients will identify one thing that makes them angry and how they feel emotionally and physically, what their thoughts are or tend to be in those situations, and what healthy or unhealthy coping mechanism they typically use Patients will identify how their coping technique works for them, as well as how it works against them. Patients will explore possible new behaviors to use in future anger situations. Patients will learn that anger itself is normal and cannot be eliminated, and that healthier coping skills can assist with resolving conflict rather than worsening situations.  Summary of Patient Progress:  The patient shared that she often is angered by selfish people who are "all about me, me, me" and chooses to cope with these feelings by stating her own feelings about the situation in a positive way.  The patient sees this coping skill as healthy and tries to use this but there are times she is unsuccessful and will instead ignore the situation which leads to her feeling hurt and often overeating.  She expressed openness to figuring out healthy coping skills and participated fully in  a discussion of some healthier coping skills.  Therapeutic Modalities:   Cognitive Behavioral Therapy Motivation Interviewing  [name]  .

## 2019-04-26 NOTE — Progress Notes (Signed)
Pt presents with a flat affect. The patient reports feeling sad because she's been all alone since her husband died and she has no family close by. Pt denies SI/HI. Pt denies AVH.   Orders reviewed with pt. V/s assessed. Verbal support provided. Pt encouraged to attend groups. 15 minute checks performed for safety.  Pt compliant with tx plan.

## 2019-04-26 NOTE — Progress Notes (Signed)
   04/26/19 2215  COVID-19 Daily Checkoff  Have you had a fever (temp > 37.80C/100F)  in the past 24 hours?  No  If you have had runny nose, nasal congestion, sneezing in the past 24 hours, has it worsened? No  COVID-19 EXPOSURE  Have you traveled outside the state in the past 14 days? No  Have you been in contact with someone with a confirmed diagnosis of COVID-19 or PUI in the past 14 days without wearing appropriate PPE? No  Have you been living in the same home as a person with confirmed diagnosis of COVID-19 or a PUI (household contact)? No  Have you been diagnosed with COVID-19? No

## 2019-04-27 MED ORDER — DONEPEZIL HCL 5 MG PO TABS
5.0000 mg | ORAL_TABLET | Freq: Every day | ORAL | Status: DC
  Administered 2019-04-27 – 2019-04-30 (×4): 5 mg via ORAL
  Filled 2019-04-27 (×6): qty 1

## 2019-04-27 NOTE — Progress Notes (Signed)
Pt denies SI but reported feeling like her life is useless and she has no purpose for living. Pt verbally contracts for safety.   Orders reviewed. V/s assessed. Verbal support provided. 15 minute checks performed for safety.   Pt compliant with tx plan and denies any side effects to the medications.     04/27/19 0900  Psych Admission Type (Psych Patients Only)  Admission Status Voluntary  Psychosocial Assessment  Patient Complaints Depression  Eye Contact Fair  Facial Expression Flat  Affect Sad  Speech Logical/coherent  Interaction Assertive  Motor Activity Fidgety  Appearance/Hygiene Unremarkable  Behavior Characteristics Appropriate to situation  Mood Anxious  Aggressive Behavior  Effect No apparent injury  Thought Process  Coherency WDL  Content WDL  Delusions None reported or observed  Perception WDL  Hallucination None reported or observed  Judgment WDL  Confusion None  Danger to Self  Current suicidal ideation? Denies  Danger to Others  Danger to Others None reported or observed

## 2019-04-27 NOTE — Progress Notes (Signed)
[facility] MD Progress Note  04/27/2019 2:22 PM RACHELANN ENLOE  MRN:  [id number] Subjective: Patient reports " I am trying to feel better". States she enjoys spending time with peers and in day room. Continues to express concern about her memory. Denies suicidal ideations. Objective : I have reviewed chart notes and have met with patient. 74 year old widowed female (husband passed away 3 years ago), lives alone, has 1 adult son with whom she has limited contact, on Fish farm manager.  Presented for worsening depression, suicidal ideations.  With recent thoughts of wanting to traffic or stabbing self, neurovegetative symptoms, social isolation and decreased self-care.  She expressed concern regarding memory loss over recent months and states that she recently had an episode where she got lost while going to a supermarket.  Patient is presenting alert, attentive, and calm, pleasant on approach. She reports she is making efforts to socialize and to feel better. Currently denies SI. 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 She does report she continues to feel depressed and expresses a sense of loneliness after he husband passed away and concern about her memory loss. MMSE today, scored 24/40. Memory is 3/3 immediate and 0/3 at 5 minutes . Orientation is intact . No disruptive or agitated behaviors on unit. Denies medication side effects.    Principal Problem: MDD (major depressive disorder) Diagnosis: Principal Problem:   MDD (major depressive disorder)  Total Time spent with patient: 20 minutes  Past Psychiatric History: Depression  Past Medical History:  Past Medical History:  Diagnosis Date  . Anxiety   . Arthritis   . Cancer ([place])    uterine  . Depression   . Endometrial polyp 02-17-13   11'14 Dx. endometrial cancer  . Hyperlipidemia   . Hypertension    . Hypothyroidism     Past Surgical History:  Procedure Laterality Date  . APPENDECTOMY    . BREAST SURGERY     biopsy  . CATARACT EXTRACTION, BILATERAL Bilateral 02-17-13   bilateral  . DILITATION & CURRETTAGE/HYSTROSCOPY WITH VERSAPOINT RESECTION N/A 01/31/2013   Procedure: DILATATION & CURETTAGE/HYSTEROSCOPY WITH CERVICAL BLOCK;  Surgeon: Marylynn Pearson, MD;  Location: [facility];  Service: Gynecology;  Laterality: N/A;  . LAPAROSCOPY FOR ECTOPIC PREGNANCY  1977   SALPINGOSTOMY (SIDE UNKNOWN)  . LAPAROTOMY Bilateral 02/18/2013   Procedure: EXPLORATORY LAPAROTOMY TOTAL ABDMONIAL HYSTERECTOMY BILATERAL SALPINGO OOPHORECTOMY ;  Surgeon: Alvino Chapel, MD;  Location: [facility];  Service: Gynecology;  Laterality: Bilateral;  . TOTAL ABDOMINAL HYSTERECTOMY W/ BILATERAL SALPINGOOPHORECTOMY    . TOTAL KNEE ARTHROPLASTY Bilateral    Family History:  Family History  Problem Relation Age of Onset  . Arthritis Mother   . Mental illness Mother   . Diabetes Mother   . Cancer Father        colon, lung, and prostate  . Arthritis Father   . Hyperlipidemia Father   . Stroke Father   . Heart disease Father   . Hypertension Father   . Colitis Father   . Esophageal cancer Neg Hx   . Rectal cancer Neg Hx   . Stomach cancer Neg Hx   . Breast cancer Neg Hx    Family Psychiatric  History: Denies Social History:  Social History   Substance and Sexual Activity  Alcohol Use Yes   Comment: twice monthly; had her 1st glass of wine in 2  year      Social History   Substance and Sexual Activity  Drug Use No    Social History   Socioeconomic History  . Marital status: Widowed    Spouse name: Not on file  . Number of children: Not on file  . Years of education: Not on file  . Highest education level: Not on file  Occupational History  . Not on file  Tobacco Use  . Smoking status: Former Smoker    Packs/day: 1.50    Years: 47.00    Pack years: 70.50    Quit date: 07/04/2009     Years since quitting: 9.8  . Smokeless tobacco: Never Used  . Tobacco comment: quit a long time ago  Substance and Sexual Activity  . Alcohol use: Yes    Comment: twice monthly; had her 1st glass of wine in 2 year   . Drug use: No  . Sexual activity: Yes    Partners: Male    Birth control/protection: Post-menopausal  Other Topics Concern  . Not on file  Social History Narrative   Retired from being an Web designer   Married for 35 years   3 step children and 1 adopted child. They live all over the [name]   Social Determinants of Health   Financial Resource Strain:   . Difficulty of Paying Living Expenses: Not on file  Food Insecurity:   . Worried About Charity fundraiser in the Last Year: Not on file  . Ran Out of Food in the Last Year: Not on file  Transportation Needs:   . Lack of Transportation (Medical): Not on file  . Lack of Transportation (Non-Medical): Not on file  Physical Activity:   . Days of Exercise per Week: Not on file  . Minutes of Exercise per Session: Not on file  Stress:   . Feeling of Stress : Not on file  Social Connections:   . Frequency of Communication with Friends and Family: Not on file  . Frequency of Social Gatherings with Friends and Family: Not on file  . Attends Religious Services: Not on file  . Active Member of Clubs or Organizations: Not on file  . Attends Archivist Meetings: Not on file  . Marital Status: Not on file   Additional Social History:    Pain Medications: see MAR Prescriptions: see MAR Over the Counter: see MAR History of alcohol / drug use?: No history of alcohol / drug abuse  Sleep: Good  Appetite:  Good  Current Medications: Current Facility-Administered Medications  Medication Dose Route Frequency Provider Last Rate Last Admin  . acetaminophen (TYLENOL) tablet 650 mg  650 mg Oral Q6H PRN Dixon, Rashaun M, NP      . alum & mag hydroxide-simeth (MAALOX/MYLANTA) 200-200-20 MG/5ML suspension 30 mL   30 mL Oral Q4H PRN Dixon, Rashaun M, NP      . aspirin EC tablet 81 mg  81 mg Oral Daily Nannette Zill, Myer Peer, MD   81 mg at 04/27/19 0756  . atorvastatin (LIPITOR) tablet 20 mg  20 mg Oral q1800 Suzane Vanderweide, Myer Peer, MD   20 mg at 04/26/19 1724  . FLUoxetine (PROZAC) capsule 20 mg  20 mg Oral Daily Callyn Severtson, Myer Peer, MD   20 mg at 04/27/19 0756  . hydrOXYzine (ATARAX/VISTARIL) tablet 10 mg  10 mg Oral TID PRN Avryl Roehm, Myer Peer, MD   10 mg at 04/26/19 2107  . levothyroxine (SYNTHROID) tablet 25 mcg  25 mcg Oral  [id number] Jenavieve Freda, Myer Peer, MD   25 mcg at 04/27/19 0756  . magnesium hydroxide (MILK OF MAGNESIA) suspension 30 mL  30 mL Oral Daily PRN Deloria Lair, NP      . traZODone (DESYREL) tablet 50 mg  50 mg Oral QHS PRN Deloria Lair, NP        Lab Results:  Results for orders placed or performed during the hospital encounter of 04/24/19 (from the past 48 hour(s))  Comprehensive metabolic panel     Status: Abnormal   Collection Time: 04/25/19  6:13 PM  Result Value Ref Range   Sodium 141 135 - 145 mmol/L   Potassium 4.0 3.5 - 5.1 mmol/L   Chloride 103 98 - 111 mmol/L   CO2 28 22 - 32 mmol/L   Glucose, Bld 144 (H) 70 - 99 mg/dL   BUN 21 8 - 23 mg/dL   Creatinine, Ser 1.49 (H) 0.44 - 1.00 mg/dL   Calcium 9.0 8.9 - 10.3 mg/dL   Total Protein 6.9 6.5 - 8.1 g/dL   Albumin 4.0 3.5 - 5.0 g/dL   AST 23 15 - 41 U/L   ALT 21 0 - 44 U/L   Alkaline Phosphatase 60 38 - 126 U/L   Total Bilirubin 0.6 0.3 - 1.2 mg/dL   GFR calc non Af Amer 34 (L) >60 mL/min   GFR calc Af Amer 39 (L) >60 mL/min   Anion gap 10 5 - 15    Comment: Performed at [facility], [place] [address] [place], [place] [address]  RPR     Status: None   Collection Time: 04/25/19  6:13 PM  Result Value Ref Range   RPR Ser Ql NON REACTIVE NON REACTIVE    Comment: Performed at [facility], [place] [address], [place], [place] [address]  CBC with Differential/Platelet     Status: None   Collection Time:  04/25/19  6:13 PM  Result Value Ref Range   WBC 5.8 4.0 - 10.5 K/uL   RBC 4.05 3.87 - 5.11 MIL/uL   Hemoglobin 12.6 12.0 - 15.0 g/dL   HCT 39.0 36.0 - 46.0 %   MCV 96.3 80.0 - 100.0 fL   MCH 31.1 26.0 - 34.0 pg   MCHC 32.3 30.0 - 36.0 g/dL   RDW 14.1 11.5 - 15.5 %   Platelets 236 150 - 400 K/uL   nRBC 0.0 0.0 - 0.2 %   Neutrophils Relative % 57 %   Neutro Abs 3.3 1.7 - 7.7 K/uL   Lymphocytes Relative 33 %   Lymphs Abs 1.9 0.7 - 4.0 K/uL   Monocytes Relative 5 %   Monocytes Absolute 0.3 0.1 - 1.0 K/uL   Eosinophils Relative 4 %   Eosinophils Absolute 0.2 0.0 - 0.5 K/uL   Basophils Relative 1 %   Basophils Absolute 0.0 0.0 - 0.1 K/uL   Immature Granulocytes 0 %   Abs Immature Granulocytes 0.02 0.00 - 0.07 K/uL    Comment: Performed at [facility], [place] [address] [place], [place] [address]  Hemoglobin A1c     Status: Abnormal   Collection Time: 04/25/19  6:13 PM  Result Value Ref Range   Hgb A1c MFr Bld 5.9 (H) 4.8 - 5.6 %    Comment: (NOTE) Pre diabetes:          5.7%-6.4% Diabetes:              >6.4% Glycemic control for   <7.0%  adults with diabetes    Mean Plasma Glucose 122.63 mg/dL    Comment: Performed at [place] [address] [place], [place] [address]  TSH     Status: None   Collection Time: 04/25/19  6:13 PM  Result Value Ref Range   TSH 1.296 0.350 - 4.500 uIU/mL    Comment: Performed by a 3rd Generation assay with a functional sensitivity of <=0.01 uIU/mL. Performed at [facility], [place] [address], [place], [place] [address]   Lipid panel     Status: None   Collection Time: 04/25/19  6:13 PM  Result Value Ref Range   Cholesterol 174 0 - 200 mg/dL   Triglycerides 119 <150 mg/dL   HDL 57 >40 mg/dL   Total CHOL/HDL Ratio 3.1 RATIO   VLDL 24 0 - 40 mg/dL   LDL Cholesterol 93 0 - 99 mg/dL    Comment:        Total Cholesterol/HDL:CHD Risk Coronary Heart Disease Risk Table                     Men   Women   1/2 Average Risk   3.4   3.3  Average Risk       5.0   4.4  2 X Average Risk   9.6   7.1  3 X Average Risk  23.4   11.0        Use the calculated Patient Ratio above and the CHD Risk Table to determine the patient's CHD Risk.        ATP III CLASSIFICATION (LDL):  <100     mg/dL   Optimal  100-129  mg/dL   Near or Above                    Optimal  130-159  mg/dL   Borderline  160-189  mg/dL   High  >190     mg/dL   Very High Performed at [facility] [address] [place],  [address]     Blood Alcohol level:  No results found for: [facility]  Metabolic Disorder Labs: Lab Results  Component Value Date   HGBA1C 5.9 (H) 04/25/2019   MPG 122.63 04/25/2019   No results found for: PROLACTIN Lab Results  Component Value Date   CHOL 174 04/25/2019   TRIG 119 04/25/2019   HDL 57 04/25/2019   CHOLHDL 3.1 04/25/2019   VLDL 24 04/25/2019   LDLCALC 93 04/25/2019   LDLCALC 92 05/07/2018    Physical Findings: AIMS: Facial and Oral Movements Muscles of Facial Expression: None, normal Lips and Perioral Area: None, normal Jaw: None, normal Tongue: None, normal,Extremity Movements Upper (arms, wrists, hands, fingers): None, normal Lower (legs, knees, ankles, toes): None, normal, Trunk Movements Neck, shoulders, hips: None, normal, Overall Severity Severity of abnormal movements (highest score from questions above): None, normal Incapacitation due to abnormal movements: None, normal Patient's awareness of abnormal movements (rate only patient's report): No Awareness, Dental Status Current problems with teeth and/or dentures?: No Does patient usually wear dentures?: No  CIWA:    COWS:     Musculoskeletal: Strength & Muscle Tone: within normal limits Gait & Station: normal Patient leans: N/A  Psychiatric Specialty Exam: Physical Exam  Nursing note and vitals reviewed. Constitutional: She is oriented to person, place, and time. She appears  well-developed.  HENT:  Head: Normocephalic.  Cardiovascular: Normal rate.  Respiratory: Effort normal.  Musculoskeletal:  General: Normal range of motion.     Cervical back: Normal range of motion.  Neurological: She is alert and oriented to person, place, and time.  Psychiatric: Her speech is normal and behavior is normal. Judgment and thought content normal. Cognition and memory are impaired. She exhibits a depressed mood.    Review of Systems  Constitutional: Negative.   HENT: Negative.   Eyes: Negative.   Respiratory: Negative.   Cardiovascular: Negative.   Gastrointestinal: Negative.   Genitourinary: Negative.   Musculoskeletal: Negative.   Skin: Negative.   Neurological: Negative.   denies headache, no chest pain, no shortness of breath, no cough, no vomiting   Blood pressure 113/79, pulse 76, temperature 98.2 F (36.8 C), temperature source Oral, resp. rate 18, height '5\' 5"'$  (1.651 m), weight 84.4 kg, SpO2 96 %.Body mass index is 30.95 kg/m.  General Appearance: improving groomingn  Eye Contact:  Good  Speech:  Normal Rate  Volume:  Normal  Mood:  remains depressed, but improved compared to admission  Affect:  more reactive   Thought Process:  Linear and Descriptions of Associations: Intact  Orientation:  Full (Time, Place, and Person)  Thought Content:  no hallucinations, no delusions expressed , not internally preoccupied   Suicidal Thoughts:  No denies suicidal or self injurious ideations at this time and contracts for safety  Homicidal Thoughts:  No  Memory:  3/3 immediate, 0/3 at 5 minutes, 1/3 with cues  Judgement:  Other:  improving   Insight:  Fair  Psychomotor Activity:  Normal- no psychomotor agitation or restlessness   Concentration:  Concentration: Good and Attention Span: Good  Recall:  Good  Fund of Knowledge:  Good  Language:  Good  Akathisia:  No  Handed:  Right  AIMS (if indicated):     Assets:  Communication Skills Desire for  Improvement Financial Resources/Insurance Housing Intimacy Leisure Time Physical Health Resilience  ADL's:  Intact  Cognition:  WNL  Sleep:  Number of Hours: 6.75   Assessment -  74 year old widowed female (husband passed away 3 years ago), lives alone, has 1 adult son with whom she has limited contact, on Fish farm manager.  Presented for worsening depression, suicidal ideations.  With recent thoughts of wanting to traffic or stabbing self, neurovegetative symptoms, social isolation and decreased self-care.  She expressed concern regarding memory loss over recent months and states that she recently had an episode where she got lost while going to a supermarket.  Patient presents with improving mood and a fuller range of affect, but still expressing depression and ongoing concerns about perceived memory loss. MMSE is 24/30 and recall is compromised ( 0/3 items at 5 minutes) . Denies SI. Tolerating medications well. Today does agree to Aricept trial. Side effects reviewed.  Treatment Plan Summary: Daily contact with patient to assess and evaluate symptoms and progress in treatment and Medication management. Treatment plan reviewed as below today 2/7  Encourage group and milieu participation Start Aricept  '5mg'$  QDAY for memory loss  Repeat BMP to monitor BUN/Creatinine and check B12/Vitamin D serum levels Continue Prozac 20 mgrs QDAY for depression Continue Lipitor 20 mgrs QDAY for hypercholesterolemia Continue Trazodone 50 mgrs QHS PRN for insomnia Continue ASA 81 mgrs QDAY  Continue Synthroid 25 micrograms QDAY for hypothyroidism Jenne Campus, MD 04/27/2019, 2:22 PM     Patient ID: Lester Kinsman, female   DOB: Aug 13, 1945, 74 y.o.   MRN: [id number]

## 2019-04-27 NOTE — [facility] Group Notes (Signed)
Adult Psychoeducational Group Note  Date:  04/27/2019 Time:  3:17 PM  Group Topic/Focus:  Identifying Needs:   The focus of this group is to help patients identify their personal needs that have been historically problematic and identify healthy behaviors to address their needs.  Participation Level:  Active  Participation Quality:  Appropriate  Affect:  Appropriate  Cognitive:  Confused  Insight: Good and Improving  Engagement in Group:  Engaged  Modes of Intervention:  Discussion and Support  Additional Comments:  Pt became upset when another Pt was monopolizing the group and its time. Agreed to end the group but came to the nurses station and started talking with this Probation officer. Group was resumed again.  Paulino Rily 04/27/2019, 3:17 PM
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
## 2019-04-27 NOTE — [facility] Group Notes (Signed)
Adult Psychoeducational Group Note  Date:  04/27/2019 Time:  11:33 AM  Group Topic/Focus:  Progressive Relaxation. A group where where the pts sit quietly and tighten and release muscles . Also how to deep breathe  Participation Level:  Active  Participation Quality:  Appropriate  Affect:  Appropriate  Cognitive:  Appropriate  Insight: Good  Engagement in Group:  Engaged  Modes of Intervention:  Activity and Support  Additional Comments:  Pt participated fully  Paulino Rily 04/27/2019, 11:33 AM

## 2019-04-27 NOTE — Progress Notes (Signed)
   04/27/19 2212  COVID-19 Daily Checkoff  Have you had a fever (temp > 37.80C/100F)  in the past 24 hours?  No  If you have had runny nose, nasal congestion, sneezing in the past 24 hours, has it worsened? No  COVID-19 EXPOSURE  Have you traveled outside the state in the past 14 days? No  Have you been in contact with someone with a confirmed diagnosis of COVID-19 or PUI in the past 14 days without wearing appropriate PPE? No  Have you been living in the same home as a person with confirmed diagnosis of COVID-19 or a PUI (household contact)? No  Have you been diagnosed with COVID-19? No

## 2019-04-27 NOTE — [facility] Group Notes (Signed)
[facility] LCSW Group Therapy Note  Date/Time:  04/27/2019 10:00-11:00AM  Type of Therapy and Topic:  Group Therapy:  Healthy and Unhealthy Supports  Participation Level:  Active   Description of Group:  Patients in this group were introduced to the idea of adding a variety of healthy supports to address the various needs in their lives.Patients discussed what additional healthy supports could be helpful in their recovery and wellness after discharge in order to prevent future hospitalizations.   An emphasis was placed on using counselor, doctor, therapy groups, 12-step groups, and problem-specific support groups to expand supports.  Several songs were played to emphasize points made throughout group.  Therapeutic Goals:   1)  discuss importance of adding supports to stay well once out of the hospital  2)  compare healthy versus unhealthy supports and identify some examples of each  3)  generate ideas and descriptions of healthy supports that can be added  4)  offer mutual support about how to address unhealthy supports  5)  encourage active participation in and adherence to discharge plan    Summary of Patient Progress:  The patient stated that current healthy supports in her life are nonexistent because she has cut herself off from everybody while current unhealthy supports include her son who has only contacted her 3 times in the last 3 years.  The patient expressed a willingness to add a widow's support group as support(s) to help in her recovery journey.  At one point, the patient complained that she could not retain her train of thought because people kept interrupting her.  She received significant positive strokes for speaking up and being kind while doing so.  Later in group, someone was telling her about the [facility] and how it may increase activities soon since the [place] vaccination is out among her age group, and she said she would not accept the vaccine and started talking  about a government conspiracy.  She was offended when CSW shut that topic of discussion down immediately.   Therapeutic Modalities:   Motivational Interviewing Brief Solution-Focused Therapy  Selmer Dominion, LCSW

## 2019-04-27 NOTE — Progress Notes (Signed)
   04/27/19 2214  Psych Admission Type (Psych Patients Only)  Admission Status Voluntary  Psychosocial Assessment  Patient Complaints Depression  Eye Contact Fair  Facial Expression Other (Comment) (appropriate)  Affect Appropriate to circumstance  Speech Logical/coherent  Interaction Assertive  Motor Activity Other (Comment) (WDL)  Behavior Characteristics Appropriate to situation  Mood Anxious;Pleasant  Thought Process  Coherency WDL  Content WDL  Delusions None reported or observed  Perception WDL  Hallucination None reported or observed  Judgment WDL  Confusion None  Danger to Self  Current suicidal ideation? Denies  Danger to Others  Danger to Others None reported or observed

## 2019-04-28 LAB — BASIC METABOLIC PANEL
Anion gap: 8 (ref 5–15)
BUN: 24 mg/dL — ABNORMAL HIGH (ref 8–23)
CO2: 27 mmol/L (ref 22–32)
Calcium: 9 mg/dL (ref 8.9–10.3)
Chloride: 105 mmol/L (ref 98–111)
Creatinine, Ser: 1.35 mg/dL — ABNORMAL HIGH (ref 0.44–1.00)
GFR calc Af Amer: 44 mL/min — ABNORMAL LOW (ref >60)
GFR calc non Af Amer: 38 mL/min — ABNORMAL LOW (ref >60)
Glucose, Bld: 146 mg/dL — ABNORMAL HIGH (ref 70–99)
Potassium: 3.8 mmol/L (ref 3.5–5.1)
Sodium: 140 mmol/L (ref 135–145)

## 2019-04-28 LAB — URINALYSIS, COMPLETE (UACMP) WITH MICROSCOPIC
Bilirubin Urine: NEGATIVE
Glucose, UA: NEGATIVE mg/dL
Hgb urine dipstick: NEGATIVE
Ketones, ur: NEGATIVE mg/dL
Leukocytes,Ua: NEGATIVE
Nitrite: NEGATIVE
Protein, ur: NEGATIVE mg/dL
Specific Gravity, Urine: 1.009 (ref 1.005–1.030)
pH: 6 (ref 5.0–8.0)

## 2019-04-28 LAB — FOLATE: Folate: 10 ng/mL (ref >5.9)

## 2019-04-28 LAB — VITAMIN D 25 HYDROXY (VIT D DEFICIENCY, FRACTURES): Vit D, 25-Hydroxy: 14.1 ng/mL — ABNORMAL LOW (ref 30–100)

## 2019-04-28 LAB — VITAMIN B12: Vitamin B-12: 146 pg/mL — ABNORMAL LOW (ref 180–914)

## 2019-04-28 NOTE — Progress Notes (Signed)
Patient stated she is no longer suicidal, no thoughts to hurt others.  Contracts for safety.  Denied A/V hallucinations.    Asks herself what is she going to do with the rest of her life.  Cannot get a job.  No family/friends in this area.  She feels extremely lonely in a big house .  She does not go out, she stays home.  Just feels miserable. Emotional support and encouragement given patient. Safety maintained with 15 minute checks.

## 2019-04-28 NOTE — Plan of Care (Signed)
Nurse discussed anxiety, depression, coping skills with patient. 

## 2019-04-28 NOTE — Tx Team (Signed)
Interdisciplinary Treatment and Diagnostic Plan Update  04/28/2019 Time of Session: 8:50am Kara Huynh MRN: [id number]  Principal Diagnosis: MDD (major depressive disorder)  Secondary Diagnoses: Principal Problem:   MDD (major depressive disorder)   Current Medications:  Current Facility-Administered Medications  Medication Dose Route Frequency Provider Last Rate Last Admin  . acetaminophen (TYLENOL) tablet 650 mg  650 mg Oral Q6H PRN Dixon, Rashaun M, NP      . alum & mag hydroxide-simeth (MAALOX/MYLANTA) 200-200-20 MG/5ML suspension 30 mL  30 mL Oral Q4H PRN Dixon, Rashaun M, NP      . aspirin EC tablet 81 mg  81 mg Oral Daily Cobos, Myer Peer, MD   81 mg at 04/28/19 0826  . atorvastatin (LIPITOR) tablet 20 mg  20 mg Oral q1800 Cobos, Myer Peer, MD   20 mg at 04/27/19 1651  . donepezil (ARICEPT) tablet 5 mg  5 mg Oral QHS Cobos, Myer Peer, MD   5 mg at 04/27/19 2127  . FLUoxetine (PROZAC) capsule 20 mg  20 mg Oral Daily Cobos, Myer Peer, MD   20 mg at 04/28/19 [id number]  . hydrOXYzine (ATARAX/VISTARIL) tablet 10 mg  10 mg Oral TID PRN Cobos, Myer Peer, MD   10 mg at 04/27/19 2127  . levothyroxine (SYNTHROID) tablet 25 mcg  25 mcg Oral Q0600 Cobos, Myer Peer, MD   25 mcg at 04/28/19 [phone]  . magnesium hydroxide (MILK OF MAGNESIA) suspension 30 mL  30 mL Oral Daily PRN Dixon, Rashaun M, NP      . traZODone (DESYREL) tablet 50 mg  50 mg Oral QHS PRN Deloria Lair, NP       PTA Medications: Medications Prior to Admission  Medication Sig Dispense Refill Last Dose  . aspirin EC 81 MG tablet Take 81 mg by mouth daily.     Marland Kitchen atorvastatin (LIPITOR) 20 MG tablet TAKE 1 TABLET BY MOUTH EVERY DAY 90 tablet 3   . FLUoxetine (PROZAC) 40 MG capsule TAKE 1 CAPSULE BY MOUTH EVERY DAY 90 capsule 1   . levothyroxine (SYNTHROID) 25 MCG tablet TAKE 1 TABLET EVERY MORNING AS DIRECTED 90 tablet 3   . lisinopril (PRINIVIL,ZESTRIL) 5 MG tablet Take 1 tablet (5 mg total) by mouth daily. 90 tablet 3      Patient Stressors:    Patient Strengths:    Treatment Modalities: Medication Management, Group therapy, Case management,  1 to 1 session with clinician, Psychoeducation, Recreational therapy.   Physician Treatment Plan for Primary Diagnosis: MDD (major depressive disorder) Long Term Goal(s): Improvement in symptoms so as ready for discharge Improvement in symptoms so as ready for discharge   Short Term Goals: Ability to identify changes in lifestyle to reduce recurrence of condition will improve Ability to verbalize feelings will improve Ability to disclose and discuss suicidal ideas Ability to demonstrate self-control will improve Ability to identify and develop effective coping behaviors will improve Ability to maintain clinical measurements within normal limits will improve Ability to identify changes in lifestyle to reduce recurrence of condition will improve Ability to maintain clinical measurements within normal limits will improve  Medication Management: Evaluate patient's response, side effects, and tolerance of medication regimen.  Therapeutic Interventions: 1 to 1 sessions, Unit Group sessions and Medication administration.  Evaluation of Outcomes: Progressing  Physician Treatment Plan for Secondary Diagnosis: Principal Problem:   MDD (major depressive disorder)  Long Term Goal(s): Improvement in symptoms so as ready for discharge Improvement in symptoms so as ready for discharge  Short Term Goals: Ability to identify changes in lifestyle to reduce recurrence of condition will improve Ability to verbalize feelings will improve Ability to disclose and discuss suicidal ideas Ability to demonstrate self-control will improve Ability to identify and develop effective coping behaviors will improve Ability to maintain clinical measurements within normal limits will improve Ability to identify changes in lifestyle to reduce recurrence of condition will improve Ability  to maintain clinical measurements within normal limits will improve     Medication Management: Evaluate patient's response, side effects, and tolerance of medication regimen.  Therapeutic Interventions: 1 to 1 sessions, Unit Group sessions and Medication administration.  Evaluation of Outcomes: Progressing   RN Treatment Plan for Primary Diagnosis: MDD (major depressive disorder) Long Term Goal(s): Knowledge of disease and therapeutic regimen to maintain health will improve  Short Term Goals: Ability to verbalize feelings will improve, Ability to disclose and discuss suicidal ideas, Ability to identify and develop effective coping behaviors will improve and Compliance with prescribed medications will improve  Medication Management: RN will administer medications as ordered by provider, will assess and evaluate patient's response and provide education to patient for prescribed medication. RN will report any adverse and/or side effects to prescribing provider.  Therapeutic Interventions: 1 on 1 counseling sessions, Psychoeducation, Medication administration, Evaluate responses to treatment, Monitor vital signs and CBGs as ordered, Perform/monitor CIWA, COWS, AIMS and Fall Risk screenings as ordered, Perform wound care treatments as ordered.  Evaluation of Outcomes: Progressing   LCSW Treatment Plan for Primary Diagnosis: MDD (major depressive disorder) Long Term Goal(s): Safe transition to appropriate next level of care at discharge, Engage patient in therapeutic group addressing interpersonal concerns.  Short Term Goals: Engage patient in aftercare planning with referrals and resources  Therapeutic Interventions: Assess for all discharge needs, 1 to 1 time with Social worker, Explore available resources and support systems, Assess for adequacy in community support network, Educate family and significant other(s) on suicide prevention, Complete Psychosocial Assessment, Interpersonal group  therapy.  Evaluation of Outcomes: Progressing   Progress in Treatment: Attending groups: Yes. Participating in groups: Yes. Taking medication as prescribed: Yes. Toleration medication: Yes. Family/Significant other contact made: No, will contact:  one of the patient's family members Patient understands diagnosis: Yes. Discussing patient identified problems/goals with staff: Yes. Medical problems stabilized or resolved: Yes. Denies suicidal/homicidal ideation: No. Passive SI Issues/concerns per patient self-inventory: No. Other:   New problem(s) identified: None   New Short Term/Long Term Goal(s):Detox, medication stabilization, elimination of SI thoughts, development of comprehensive mental wellness plan.    Patient Goals:  "To have some peace and to be kinder to myself"   Discharge Plan or Barriers: Patient currently lives alone, however she does not know if she can return at discharge due to fear and loneliness. CSW will continue to follow and assess for appropriate referrals and possible discharge planning.    Reason for Continuation of Hospitalization: Anxiety Depression Medication stabilization Suicidal ideation  Estimated Length of Stay: 3-5 days   Attendees: Patient: Kara Huynh  04/28/2019 10:01 AM  Physician: Dr. Neita Garnet, MD 04/28/2019 10:01 AM  Nursing:  04/28/2019 10:01 AM  RN Care Manager: 04/28/2019 10:01 AM  Social Worker: Radonna Ricker, LCSW 04/28/2019 10:01 AM  Recreational Therapist:  04/28/2019 10:01 AM  Other:  04/28/2019 10:01 AM  Other:  04/28/2019 10:01 AM  Other: 04/28/2019 10:01 AM    Scribe for Treatment Team: Marylee Floras, [place] 04/28/2019 10:01 AM

## 2019-04-28 NOTE — Progress Notes (Signed)
   04/28/19 2200  Psych Admission Type (Psych Patients Only)  Admission Status Voluntary  Psychosocial Assessment  Patient Complaints Anxiety  Eye Contact Fair  Facial Expression Other (Comment) (appropriate)  Affect Appropriate to circumstance  Speech Logical/coherent  Interaction Assertive  Motor Activity Other (Comment) (WDL)  Appearance/Hygiene Disheveled  Behavior Characteristics Anxious  Mood Depressed  Thought Process  Coherency WDL  Content WDL  Delusions None reported or observed  Perception WDL  Hallucination None reported or observed  Judgment WDL  Confusion None  Danger to Self  Current suicidal ideation? Denies  Danger to Others  Danger to Others None reported or observed

## 2019-04-28 NOTE — Progress Notes (Signed)
Recreation Therapy Notes  Date:  2.8.21 Time: 0930 Location: [address] Dayroom  Group Topic: Stress Management  Goal Area(s) Addresses:  Patient will identify positive stress management techniques. Patient will identify benefits of using stress management post d/c.  Behavioral Response:  Engaged  Intervention: Stress Management  Activity :  Meditation.  LRT played a meditation that focused on making the most of your day and making every moment count.  Patients were to listen and follow along as meditation played to engage in activity.  Education:  Stress Management, Discharge Planning.   Education Outcome: Acknowledges Education  Clinical Observations/Feedback:  Pt attended and participated in activity.     Victorino Sparrow, LRT/CTRS         Victorino Sparrow A 04/28/2019 10:43 AM

## 2019-04-28 NOTE — BHH Group Notes (Signed)
LCSW Group Therapy Notes  Type of Therapy and Topic: Group Therapy: Healthy Vs. Unhealthy Coping Strategies  Participation Level: BHH PARTICIPATION LEVEL: Active  Description of Group: In this group, patients will be encouraged to explore their healthy and unhealthy coping strategics. Coping strategies are actions that we take to deal with stress, problems, or uncomfortable emotions in our daily lives. Each patient will be challenged to read some scenarios and discuss the unhealthy and healthy coping strategies within those scenarios. Also, each patient will be challenged to describe current healthy and unhealthy strategies that they use in their own lives and discuss the outcomes and barriers to those strategies. This group will be process-oriented, with patients participating in exploration of their own experiences as well as giving and receiving support and challenge from other group members.  Therapeutic Goals: Patient will identify personal healthy and unhealthy coping strategies. Patient will identify healthy and unhealthy coping strategies, in others, through scenarios. Patient will identify expected outcomes of healthy and unhealthy coping strategies. Patient will identify barriers to using healthy coping strategies.  Summary of Patient Progress:  Due to the COVID-19 pandemic and acuity of the unit, this group has been supplemented with worksheets.  Therapeutic Modalities:  Cognitive Behavioral Therapy Solution Focused Therapy Motivational Interviewing

## 2019-04-28 NOTE — B[facility] Group Notes (Signed)
Adult Psychoeducational Group Note  Date:  04/28/2019 Time:  9:57 PM  Group Topic/Focus:  Wrap-Up Group:   The focus of this group is to help patients review their daily goal of treatment and discuss progress on daily workbooks.  Participation Level:  Active  Participation Quality:  Appropriate and Attentive  Affect:  Appropriate  Cognitive:  Alert and Appropriate  Insight: Appropriate and Good  Engagement in Group:  Engaged  Modes of Intervention:  Discussion and Education  Additional Comments:  Pt attended and participated in wrap up group this evening and rated their day a 7/10. Pt spoke with their friends and family who gave her hope and love. Pt completed their goal, which was to "stay in the moment" and not be a "downer".   Cristi Loron 04/28/2019, 9:57 PM

## 2019-04-28 NOTE — Progress Notes (Signed)
Spiritual care group on grief and loss facilitated by chaplain Jerene Pitch  Group Goal:  Support / Education around grief and loss Members engage in facilitated group support and psycho-social education.  Group Description:  Following introductions and group rules, group members engaged in facilitated group dialog and support around topic of loss, with particular support around experiences of loss in their lives. Group Identified types of loss (relationships / self / things) and identified patterns, circumstances, and changes that precipitate losses. Reflected on thoughts / feelings around loss, normalized grief responses, and recognized variety in grief experience. Patient Progress:  Kara Huynh was present throughout group.  Spoke with others about the loss of her spouse three years prior, as well as loss of contact with her adopted son.  Described loss of spouse as bringing up other experiences of grief: her relationship with her parents, her inability to have children.  She spoke with group about her relationship with spouse, Kara Huynh, and how they had created a loving space for the children he had from another marriage as well as the son they adopted at 66 months.  Kara Huynh with other group members about the values that helped define her relationship and which she held in caring for Kara Huynh through Leukemia.  She expressed feelings of depression, disorientation - especially being in the house they shared without him there.  She spoke with group members about how to re-orient to her values, re-imagine what she gets to do now, and find care for herself.  Kara Huynh noted that she had sold all of her furniture - hoping that this would make it easier to sell her house, but she has not been able to do so, and thus is going back to an empty house.

## 2019-04-28 NOTE — Progress Notes (Signed)
[facility] MD Progress Note  04/28/2019 3:36 PM Kara Huynh  MRN:  [id number] Subjective: patient reports she is feeling better in the context of interacting with peers and feeling less lonely. She denies suicidal ideations at this time. She does express concerns and tends to ruminate about returning home once she discharges, describing feeling lonely and isolated in that environment. Currently denies medication side effects .   Objective : I have discussed case with treatment team and have met with patient. 74 year old widowed female (husband passed away 3 years ago), lives alone, has 1 adult son with whom she has limited contact, on Fish farm manager.  Presented for worsening depression, suicidal ideations.  With recent thoughts of wanting to traffic or stabbing self, neurovegetative symptoms, social isolation and decreased self-care.  She expressed concern regarding memory loss over recent months and states that she recently had an episode where she got lost while going to a supermarket.  Patient presents alert, attentive, well related . Describes mood as improving, but as above, attributes this to being in milieu environment and being able to interact with peers and staff, and ruminates about feeling lonely and socially isolated at home. Currently denies suicidal ideations and presents future oriented. She states she is planning to sell her home and move in to a senior living center or similar. We have reviewed whether she may have other options, such as living with family member for a period of time, but patient states " I would not want to be a burden to anyone". She often brings up other patients' concerns or issues during our assessments, based on what she hears them say or talk about in group sessions. She responds well to gentle redirection to focus on her own improvement and treatment. Denies medication side effects. No disruptive or agitated behaviors, visible in day room.     Principal Problem:  MDD (major depressive disorder) Diagnosis: Principal Problem:   MDD (major depressive disorder)  Total Time spent with patient: 20 minutes  Past Psychiatric History: Depression  Past Medical History:  Past Medical History:  Diagnosis Date  . Anxiety   . Arthritis   . Cancer ([place])    uterine  . Depression   . Endometrial polyp 02-17-13   11'14 Dx. endometrial cancer  . Hyperlipidemia   . Hypertension   . Hypothyroidism     Past Surgical History:  Procedure Laterality Date  . APPENDECTOMY    . BREAST SURGERY     biopsy  . CATARACT EXTRACTION, BILATERAL Bilateral 02-17-13   bilateral  . DILITATION & CURRETTAGE/HYSTROSCOPY WITH VERSAPOINT RESECTION N/A 01/31/2013   Procedure: DILATATION & CURETTAGE/HYSTEROSCOPY WITH CERVICAL BLOCK;  Surgeon: Marylynn Pearson, MD;  Location: [facility];  Service: Gynecology;  Laterality: N/A;  . LAPAROSCOPY FOR ECTOPIC PREGNANCY  1977   SALPINGOSTOMY (SIDE UNKNOWN)  . LAPAROTOMY Bilateral 02/18/2013   Procedure: EXPLORATORY LAPAROTOMY TOTAL ABDMONIAL HYSTERECTOMY BILATERAL SALPINGO OOPHORECTOMY ;  Surgeon: Alvino Chapel, MD;  Location: [facility];  Service: Gynecology;  Laterality: Bilateral;  . TOTAL ABDOMINAL HYSTERECTOMY W/ BILATERAL SALPINGOOPHORECTOMY    . TOTAL KNEE ARTHROPLASTY Bilateral    Family History:  Family History  Problem Relation Age of Onset  . Arthritis Mother   . Mental illness Mother   . Diabetes Mother   . Cancer Father        colon, lung, and prostate  . Arthritis Father   . Hyperlipidemia Father   . Stroke Father   . Heart disease Father   . Hypertension  Father   . Colitis Father   . Esophageal cancer Neg Hx   . Rectal cancer Neg Hx   . Stomach cancer Neg Hx   . Breast cancer Neg Hx    Family Psychiatric  History: Denies Social History:  Social History   Substance and Sexual Activity  Alcohol Use Yes   Comment: twice monthly; had her 1st glass of wine in 2 year      Social History   Substance and  Sexual Activity  Drug Use No    Social History   Socioeconomic History  . Marital status: Widowed    Spouse name: Not on file  . Number of children: Not on file  . Years of education: Not on file  . Highest education level: Not on file  Occupational History  . Not on file  Tobacco Use  . Smoking status: Former Smoker    Packs/day: 1.50    Years: 47.00    Pack years: 70.50    Quit date: 07/04/2009    Years since quitting: 9.8  . Smokeless tobacco: Never Used  . Tobacco comment: quit a long time ago  Substance and Sexual Activity  . Alcohol use: Yes    Comment: twice monthly; had her 1st glass of wine in 2 year   . Drug use: No  . Sexual activity: Yes    Partners: Male    Birth control/protection: Post-menopausal  Other Topics Concern  . Not on file  Social History Narrative   Retired from being an Web designer   Married for 35 years   3 step children and 1 adopted child. They live all over the [name]   Social Determinants of Health   Financial Resource Strain:   . Difficulty of Paying Living Expenses: Not on file  Food Insecurity:   . Worried About Charity fundraiser in the Last Year: Not on file  . Ran Out of Food in the Last Year: Not on file  Transportation Needs:   . Lack of Transportation (Medical): Not on file  . Lack of Transportation (Non-Medical): Not on file  Physical Activity:   . Days of Exercise per Week: Not on file  . Minutes of Exercise per Session: Not on file  Stress:   . Feeling of Stress : Not on file  Social Connections:   . Frequency of Communication with Friends and Family: Not on file  . Frequency of Social Gatherings with Friends and Family: Not on file  . Attends Religious Services: Not on file  . Active Member of Clubs or Organizations: Not on file  . Attends Archivist Meetings: Not on file  . Marital Status: Not on file   Additional Social History:    Pain Medications: see MAR Prescriptions: see MAR Over the  Counter: see MAR History of alcohol / drug use?: No history of alcohol / drug abuse  Sleep: Good  Appetite:  Good  Current Medications: Current Facility-Administered Medications  Medication Dose Route Frequency Provider Last Rate Last Admin  . acetaminophen (TYLENOL) tablet 650 mg  650 mg Oral Q6H PRN Dixon, Rashaun M, NP      . alum & mag hydroxide-simeth (MAALOX/MYLANTA) 200-200-20 MG/5ML suspension 30 mL  30 mL Oral Q4H PRN Dixon, Rashaun M, NP      . aspirin EC tablet 81 mg  81 mg Oral Daily Conn Trombetta, Myer Peer, MD   81 mg at 04/28/19 0826  . atorvastatin (LIPITOR) tablet 20 mg  20 mg Oral  [id number] Cortlin Marano, Myer Peer, MD   20 mg at 04/27/19 1651  . donepezil (ARICEPT) tablet 5 mg  5 mg Oral QHS Hue Frick, Myer Peer, MD   5 mg at 04/27/19 2127  . FLUoxetine (PROZAC) capsule 20 mg  20 mg Oral Daily Danice Dippolito, Myer Peer, MD   20 mg at 04/28/19 1856  . hydrOXYzine (ATARAX/VISTARIL) tablet 10 mg  10 mg Oral TID PRN Kiyo Heal, Myer Peer, MD   10 mg at 04/27/19 2127  . levothyroxine (SYNTHROID) tablet 25 mcg  25 mcg Oral Q0600 Xayla Puzio, Myer Peer, MD   25 mcg at 04/28/19 [id number]  . magnesium hydroxide (MILK OF MAGNESIA) suspension 30 mL  30 mL Oral Daily PRN Doren Custard, Rashaun M, NP      . traZODone (DESYREL) tablet 50 mg  50 mg Oral QHS PRN Deloria Lair, NP        Lab Results:  Results for orders placed or performed during the hospital encounter of 04/24/19 (from the past 48 hour(s))  Urinalysis, Complete w Microscopic     Status: Abnormal   Collection Time: 04/28/19  6:23 AM  Result Value Ref Range   Color, Urine STRAW (A) YELLOW   APPearance CLEAR CLEAR   Specific Gravity, Urine 1.009 1.005 - 1.030   pH 6.0 5.0 - 8.0   Glucose, UA NEGATIVE NEGATIVE mg/dL   Hgb urine dipstick NEGATIVE NEGATIVE   Bilirubin Urine NEGATIVE NEGATIVE   Ketones, ur NEGATIVE NEGATIVE mg/dL   Protein, ur NEGATIVE NEGATIVE mg/dL   Nitrite NEGATIVE NEGATIVE   Leukocytes,Ua NEGATIVE NEGATIVE   RBC / HPF 0-5 0 - 5 RBC/hpf    WBC, UA 0-5 0 - 5 WBC/hpf   Bacteria, UA RARE (A) NONE SEEN   Squamous Epithelial / LPF 0-5 0 - 5    Comment: Performed at [facility], [place] [address] [place], [place] [address]    Blood Alcohol level:  No results found for: [facility]  Metabolic Disorder Labs: Lab Results  Component Value Date   HGBA1C 5.9 (H) 04/25/2019   MPG 122.63 04/25/2019   No results found for: PROLACTIN Lab Results  Component Value Date   CHOL 174 04/25/2019   TRIG 119 04/25/2019   HDL 57 04/25/2019   CHOLHDL 3.1 04/25/2019   VLDL 24 04/25/2019   LDLCALC 93 04/25/2019   LDLCALC 92 05/07/2018    Physical Findings: AIMS: Facial and Oral Movements Muscles of Facial Expression: None, normal Lips and Perioral Area: None, normal Jaw: None, normal Tongue: None, normal,Extremity Movements Upper (arms, wrists, hands, fingers): None, normal Lower (legs, knees, ankles, toes): None, normal, Trunk Movements Neck, shoulders, hips: None, normal, Overall Severity Severity of abnormal movements (highest score from questions above): None, normal Incapacitation due to abnormal movements: None, normal Patient's awareness of abnormal movements (rate only patient's report): No Awareness, Dental Status Current problems with teeth and/or dentures?: No Does patient usually wear dentures?: No  CIWA:    COWS:     Musculoskeletal: Strength & Muscle Tone: within normal limits Gait & Station: normal Patient leans: N/A  Psychiatric Specialty Exam: Physical Exam  Nursing note and vitals reviewed. Constitutional: She is oriented to person, place, and time. She appears well-developed.  HENT:  Head: Normocephalic.  Cardiovascular: Normal rate.  Respiratory: Effort normal.  Musculoskeletal:        General: Normal range of motion.     Cervical back: Normal range of motion.  Neurological: She is alert and oriented to person, place, and time.  Psychiatric: Her speech is normal and behavior is normal.  Judgment and thought content normal. Cognition and memory are impaired. She exhibits a depressed mood.    Review of Systems  Constitutional: Negative.   HENT: Negative.   Eyes: Negative.   Respiratory: Negative.   Cardiovascular: Negative.   Gastrointestinal: Negative.   Genitourinary: Negative.   Musculoskeletal: Negative.   Skin: Negative.   Neurological: Negative.   denies headache, no chest pain, no shortness of breath, no cough, no vomiting   Blood pressure 116/79, pulse 80, temperature 97.6 F (36.4 C), temperature source Oral, resp. rate 18, height '5\' 5"'$  (1.651 m), weight 84.4 kg, SpO2 96 %.Body mass index is 30.95 kg/m.  General Appearance: improving grooming  Eye Contact:  Good  Speech:  Normal Rate  Volume:  Normal  Mood:  partially improved mood   Affect:  appears more reactive  Thought Process:  Linear and Descriptions of Associations: Intact  Orientation:  Full (Time, Place, and Person)  Thought Content:  no hallucinations, no delusions expressed , not internally preoccupied   Suicidal Thoughts:  No denies suicidal or self injurious ideations at this time and contracts for safety  Homicidal Thoughts:  No  Memory:  3/3 immediate, 0/3 at 5 minutes, 1/3 with cues  Judgement:  Other:  improving   Insight:  Fair  Psychomotor Activity:  Normal- no psychomotor agitation or restlessness   Concentration:  Concentration: Good and Attention Span: Good  Recall:  Good  Fund of Knowledge:  Good  Language:  Good  Akathisia:  No  Handed:  Right  AIMS (if indicated):     Assets:  Communication Skills Desire for Improvement  ADL's:  Intact  Cognition:  WNL  Sleep:  Number of Hours: 5   Assessment -  74 year old widowed female (husband passed away 3 years ago), lives alone, has 1 adult son with whom she has limited contact, on Fish farm manager.  Presented for worsening depression, suicidal ideations.  With recent thoughts of wanting to traffic or stabbing self, neurovegetative  symptoms, social isolation and decreased self-care.  She expressed concern regarding memory loss over recent months and states that she recently had an episode where she got lost while going to a supermarket.  Today patient is presenting with gradually improving mood and range of affect and is noted to brighten as she interacts with peers and staff. She does report ongoing ruminations / apprehension about returning home following discharge from unit, and mainly appears concerned about loneliness and limited support network. She denies SI at this time. She is tolerating medications well , including Aricept , which was started yesterday.   Treatment Plan Summary: Daily contact with patient to assess and evaluate symptoms and progress in treatment and Medication management. Treatment plan reviewed as below today 2/8 Encourage group and milieu participation Continue Aricept  '5mg'$  QDAY for memory loss  Continue Prozac 20 mgrs QDAY for depression Continue Lipitor 20 mgrs QDAY for hypercholesterolemia Continue Trazodone 50 mgrs QHS PRN for insomnia Continue ASA 81 mgrs QDAY  Continue Synthroid 25 micrograms QDAY for hypothyroidism Labs pending  Treatment team working on disposition planning . As reviewed with CSW : currently exploring whether she may be able to temporarily live with family member while she makes arrangements to [facility] into [facility] .  Jenne Campus, MD 04/28/2019, 3:36 PM     Patient ID: Kara Huynh, female   DOB: 31-Dec-1945, 74 y.o.   MRN: [id number]

## 2019-04-29 DIAGNOSIS — F411 Generalized anxiety disorder: Secondary | ICD-10-CM

## 2019-04-29 DIAGNOSIS — F329 Major depressive disorder, single episode, unspecified: Secondary | ICD-10-CM

## 2019-04-29 DIAGNOSIS — G301 Alzheimer's disease with late onset: Secondary | ICD-10-CM

## 2019-04-29 DIAGNOSIS — F0281 Dementia in other diseases classified elsewhere with behavioral disturbance: Secondary | ICD-10-CM

## 2019-04-29 MED ORDER — THIAMINE HCL 100 MG PO TABS
100.0000 mg | ORAL_TABLET | Freq: Every day | ORAL | Status: DC
  Administered 2019-04-29 – 2019-05-14 (×16): 100 mg via ORAL
  Filled 2019-04-29 (×18): qty 1

## 2019-04-29 MED ORDER — FLUOXETINE HCL 10 MG PO CAPS
30.0000 mg | ORAL_CAPSULE | Freq: Every day | ORAL | Status: DC
  Administered 2019-04-30 – 2019-05-14 (×15): 30 mg via ORAL
  Filled 2019-04-29 (×17): qty 3

## 2019-04-29 MED ORDER — FOLIC ACID 1 MG PO TABS
1.0000 mg | ORAL_TABLET | Freq: Every day | ORAL | Status: DC
  Administered 2019-04-29 – 2019-05-14 (×16): 1 mg via ORAL
  Filled 2019-04-29 (×18): qty 1

## 2019-04-29 MED ORDER — BUSPIRONE HCL 5 MG PO TABS
5.0000 mg | ORAL_TABLET | Freq: Two times a day (BID) | ORAL | Status: DC
  Administered 2019-04-29 – 2019-05-01 (×4): 5 mg via ORAL
  Filled 2019-04-29 (×8): qty 1

## 2019-04-29 NOTE — Progress Notes (Signed)
The patient stated that what she learned today is that she needs to sleep. Her goal for tomorrow is to "stay in the moment".

## 2019-04-29 NOTE — Progress Notes (Signed)
Recreation Therapy Notes  Animal-Assisted Activity (AAA) Program Checklist/Progress Notes Patient Eligibility Criteria Checklist & Daily Group note for Rec Tx Intervention  Date: 2.9.21 Time: 28 Location: [address]   AAA/T Program Assumption of Risk Form signed by Teacher, music or Parent Legal Guardian  YES   Patient is free of allergies or sever asthma  YES  Patient reports no fear of animals  YES   Patient reports no history of cruelty to animals  YES  Patient understands his/her participation is voluntary  YES  Patient washes hands before animal contact  YES   Patient washes hands after animal contact   YES  Behavioral Response: Engaged  Education: Contractor, Appropriate Animal Interaction   Education Outcome: Acknowledges understanding/In group clarification offered/Needs additional education.   Clinical Observations/Feedback: Pt attended and participated in activity.    Victorino Sparrow, LRT/CTRS   Victorino Sparrow A 04/29/2019 3:28 PM

## 2019-04-29 NOTE — Progress Notes (Signed)
Pt stated she was lonely without her husband. Pt encouraged to find some groups, or places she could volunteer to help her keep busy. Pt encouraged to work on finding things to do especially when things start to open up as COVID restrictions begin to lift in the near future.

## 2019-04-29 NOTE — [facility] Suicide Risk Assessment (Signed)
[facility] INPATIENT:  Family/Significant Other Suicide Prevention Education  Suicide Prevention Education:  Education Completed; Kara Huynh, son [phone] has been identified by the patient as the family member/significant other with whom the patient will be residing, and identified as the person(s) who will aid the patient in the event of a mental health crisis (suicidal ideations/suicide attempt).  With written consent from the patient, the family member/significant other has been provided the following suicide prevention education, prior to the and/or following the discharge of the patient.  The suicide prevention education provided includes the following:  Suicide risk factors  Suicide prevention and interventions  National Suicide Hotline telephone number  [facility] assessment telephone number  [facility] Emergency Assistance [place] and/or Residential Mobile Crisis Unit telephone number  Request made of family/significant other to:  Remove weapons (e.g., guns, rifles, knives), all items previously/currently identified as safety concern.    Remove drugs/medications (over-the-counter, prescriptions, illicit drugs), all items previously/currently identified as a safety concern.  The family member/significant other verbalizes understanding of the suicide prevention education information provided.  The family member/significant other agrees to remove the items of safety concern listed above.     Son, Kara Huynh, shares that the patient and his father moved from their home in [place], [place] to [place], [place] about 4-5 years ago to care for the patient's father-in-law at the end of his life. Patient has lived in that [place] since then. There are no local family or close friends other than concerned neighbors.  Son reports that he has been concerned about the patient's memory and behaviors for approximately 3 years, ever since patient's husband died. Son shares that  he has his own concerns about patient living independently and reports he has tried to assist the patient in long-term placement. Son reports that patient is unfortunately not eligible for Medicaid as she receives SSI from her deceased husband and she owns the 57. Son shares that patient has become forgetful in regard to bills and has neglected to make reverse-mortgage payments, HOA payments, and utility payments. Per son, patient has not had hot water in four months. CSW shared with son that she will make an APS report based on this information. Son denies knowledge of other home safety concerns. Per son, patient has food in the home and other utilities are on.   Son shares that the patient has a history of medication non-compliance due to forgetfulness, not refusal.  Son reports that his mother has turned "aggressive" toward him within the last year, he attributes this to their estrangement. Son shares that patient will occasionally call him and angrily ask why he moved away from [facility] and left her here (son never lived in [place]). Sometimes the patient will call her son believing that her husband is still living.  Son is willing to help patient sell her condo, but he worries about her finding a safe place to be first. He believes that the patient is receptive to being placed into an assisted living facility or memory care facility.   Son shares that he has considered having the patient come to stay with him and his family in [place], but he does not believe this would be a healthy or safe environment. Son lives with his fiance and their two children in a small two bedroom condo.  Son has no additional questions or concerns at this time, he was appreciative of CSW calling and providing updates.      Kara Huynh 04/29/2019, 2:59  PM

## 2019-04-29 NOTE — Progress Notes (Signed)
[facility] MD Progress Note  04/29/2019 12:41 PM Kara Huynh  MRN:  [id number] Subjective: Patient is a 74 year old female with a probable past psychiatric history significant for major depression, generalized anxiety disorder, probable dementia of unspecified origin (most likely Alzheimer's) who was admitted on 04/24/2019 secondary to suicidal ideation.  Objective: Patient is seen and examined.  Patient is a 74 year old female with the above-stated past psychiatric history who is seen in follow-up.  She remains significantly depressed.  She is tearful during the interview.  She is alert and oriented x4 today.  Review of the electronic medical record revealed that she has had memory issues since at least 06/2018.  She presented to the emergency department at that time confused.  Her husband had been dead for several years, and she was still telling neighbors that he was still alive.  She had CT scan of the brain as well as an MRI of the brain done, and it showed mild to moderate atrophy.  She apparently has been on multiple antidepressants in the past.  She does not recall which ones she has been on in the past.  We discussed contacting her son and letting him know that she is in the hospital.  She declined this.  She stated that she had adopted him 3 days after he was born.  She stated that he had only called her "twice in the last 3 years".  I discussed with her the potential that perhaps she might not remember when her son and called her because of her memory issues.  Review of her laboratories revealed a negative reversible dementia work-up.  She does not have an anemia, and her MCV was normal suggesting normal folic acid as well as thiamine.  Her folic acid was measured, and was 10.  Her RPR was negative.  Her TSH was normal at 1.296.  Urinalysis was negative.  Her EKG showed a normal sinus rhythm with a normal QTC.  It looks like the last time she saw anyone for follow-up directly was in February 2020.  She was  being treated with fluoxetine 40 mg a day at that time and 25 mcg p.o. daily of levothyroxine.  She also has a history of type 2 diabetes, hypertension and hyperlipidemia.  Psychotherapy was recommended at that time, but she had declined it.  Her vital signs are stable, she is afebrile.  She slept 6.75 hours last night.  The patient's home medication list stated she was on Prozac 40, but because of the patient's memory issues she was unclear on her dosage, and 20 mg was continued.  Principal Problem: MDD (major depressive disorder) Diagnosis: Principal Problem:   MDD (major depressive disorder)  Total Time spent with patient: 20 minutes  Past Psychiatric History: See admission H&P  Past Medical History:  Past Medical History:  Diagnosis Date  . Anxiety   . Arthritis   . Cancer ([place])    uterine  . Depression   . Endometrial polyp 02-17-13   11'14 Dx. endometrial cancer  . Hyperlipidemia   . Hypertension   . Hypothyroidism     Past Surgical History:  Procedure Laterality Date  . APPENDECTOMY    . BREAST SURGERY     biopsy  . CATARACT EXTRACTION, BILATERAL Bilateral 02-17-13   bilateral  . DILITATION & CURRETTAGE/HYSTROSCOPY WITH VERSAPOINT RESECTION N/A 01/31/2013   Procedure: DILATATION & CURETTAGE/HYSTEROSCOPY WITH CERVICAL BLOCK;  Surgeon: Marylynn Pearson, MD;  Location: [facility];  Service: Gynecology;  Laterality: N/A;  . LAPAROSCOPY  FOR ECTOPIC PREGNANCY  1977   SALPINGOSTOMY (SIDE UNKNOWN)  . LAPAROTOMY Bilateral 02/18/2013   Procedure: EXPLORATORY LAPAROTOMY TOTAL ABDMONIAL HYSTERECTOMY BILATERAL SALPINGO OOPHORECTOMY ;  Surgeon: Alvino Chapel, MD;  Location: [facility];  Service: Gynecology;  Laterality: Bilateral;  . TOTAL ABDOMINAL HYSTERECTOMY W/ BILATERAL SALPINGOOPHORECTOMY    . TOTAL KNEE ARTHROPLASTY Bilateral    Family History:  Family History  Problem Relation Age of Onset  . Arthritis Mother   . Mental illness Mother   . Diabetes Mother   . Cancer Father         colon, lung, and prostate  . Arthritis Father   . Hyperlipidemia Father   . Stroke Father   . Heart disease Father   . Hypertension Father   . Colitis Father   . Esophageal cancer Neg Hx   . Rectal cancer Neg Hx   . Stomach cancer Neg Hx   . Breast cancer Neg Hx    Family Psychiatric  History: See admission H&P Social History:  Social History   Substance and Sexual Activity  Alcohol Use Yes   Comment: twice monthly; had her 1st glass of wine in 2 year      Social History   Substance and Sexual Activity  Drug Use No    Social History   Socioeconomic History  . Marital status: Widowed    Spouse name: Not on file  . Number of children: Not on file  . Years of education: Not on file  . Highest education level: Not on file  Occupational History  . Not on file  Tobacco Use  . Smoking status: Former Smoker    Packs/day: 1.50    Years: 47.00    Pack years: 70.50    Quit date: 07/04/2009    Years since quitting: 9.8  . Smokeless tobacco: Never Used  . Tobacco comment: quit a long time ago  Substance and Sexual Activity  . Alcohol use: Yes    Comment: twice monthly; had her 1st glass of wine in 2 year   . Drug use: No  . Sexual activity: Yes    Partners: Male    Birth control/protection: Post-menopausal  Other Topics Concern  . Not on file  Social History Narrative   Retired from being an Web designer   Married for 35 years   3 step children and 1 adopted child. They live all over the [name]   Social Determinants of Health   Financial Resource Strain:   . Difficulty of Paying Living Expenses: Not on file  Food Insecurity:   . Worried About Charity fundraiser in the Last Year: Not on file  . Ran Out of Food in the Last Year: Not on file  Transportation Needs:   . Lack of Transportation (Medical): Not on file  . Lack of Transportation (Non-Medical): Not on file  Physical Activity:   . Days of Exercise per Week: Not on file  . Minutes of  Exercise per Session: Not on file  Stress:   . Feeling of Stress : Not on file  Social Connections:   . Frequency of Communication with Friends and Family: Not on file  . Frequency of Social Gatherings with Friends and Family: Not on file  . Attends Religious Services: Not on file  . Active Member of Clubs or Organizations: Not on file  . Attends Archivist Meetings: Not on file  . Marital Status: Not on file   Additional Social History:  Pain Medications: see MAR Prescriptions: see MAR Over the Counter: see MAR History of alcohol / drug use?: No history of alcohol / drug abuse                    Sleep: Good  Appetite:  Fair  Current Medications: Current Facility-Administered Medications  Medication Dose Route Frequency Provider Last Rate Last Admin  . acetaminophen (TYLENOL) tablet 650 mg  650 mg Oral Q6H PRN Dixon, Rashaun M, NP      . alum & mag hydroxide-simeth (MAALOX/MYLANTA) 200-200-20 MG/5ML suspension 30 mL  30 mL Oral Q4H PRN Dixon, Rashaun M, NP      . aspirin EC tablet 81 mg  81 mg Oral Daily Cobos, Myer Peer, MD   81 mg at 04/29/19 0810  . atorvastatin (LIPITOR) tablet 20 mg  20 mg Oral q1800 Cobos, Myer Peer, MD   20 mg at 04/28/19 1732  . donepezil (ARICEPT) tablet 5 mg  5 mg Oral QHS Cobos, Myer Peer, MD   5 mg at 04/28/19 2137  . FLUoxetine (PROZAC) capsule 20 mg  20 mg Oral Daily Cobos, Myer Peer, MD   20 mg at 04/29/19 0810  . hydrOXYzine (ATARAX/VISTARIL) tablet 10 mg  10 mg Oral TID PRN Cobos, Myer Peer, MD   10 mg at 04/28/19 2137  . levothyroxine (SYNTHROID) tablet 25 mcg  25 mcg Oral Q0600 Cobos, Myer Peer, MD   25 mcg at 04/28/19 [phone]  . magnesium hydroxide (MILK OF MAGNESIA) suspension 30 mL  30 mL Oral Daily PRN Doren Custard, Rashaun M, NP      . traZODone (DESYREL) tablet 50 mg  50 mg Oral QHS PRN Deloria Lair, NP        Lab Results:  Results for orders placed or performed during the hospital encounter of 04/24/19 (from the  past 48 hour(s))  Urinalysis, Complete w Microscopic     Status: Abnormal   Collection Time: 04/28/19  6:23 AM  Result Value Ref Range   Color, Urine STRAW (A) YELLOW   APPearance CLEAR CLEAR   Specific Gravity, Urine 1.009 1.005 - 1.030   pH 6.0 5.0 - 8.0   Glucose, UA NEGATIVE NEGATIVE mg/dL   Hgb urine dipstick NEGATIVE NEGATIVE   Bilirubin Urine NEGATIVE NEGATIVE   Ketones, ur NEGATIVE NEGATIVE mg/dL   Protein, ur NEGATIVE NEGATIVE mg/dL   Nitrite NEGATIVE NEGATIVE   Leukocytes,Ua NEGATIVE NEGATIVE   RBC / HPF 0-5 0 - 5 RBC/hpf   WBC, UA 0-5 0 - 5 WBC/hpf   Bacteria, UA RARE (A) NONE SEEN   Squamous Epithelial / LPF 0-5 0 - 5    Comment: Performed at [facility], [place] [address], [place], [place] [address]  Vitamin B12     Status: Abnormal   Collection Time: 04/28/19  6:08 PM  Result Value Ref Range   Vitamin B-12 146 (L) 180 - 914 pg/mL    Comment: (NOTE) This assay is not validated for testing neonatal or myeloproliferative syndrome specimens for Vitamin B12 levels. Performed at [facility], [place] [address] [place], [place] [address]   VITAMIN D 25 Hydroxy (Vit-D Deficiency, Fractures)     Status: Abnormal   Collection Time: 04/28/19  6:08 PM  Result Value Ref Range   Vit D, 25-Hydroxy 14.10 (L) 30 - 100 ng/mL    Comment: (NOTE) Vitamin D deficiency has been defined by the Institute of Medicine  and an Endocrine Society practice guideline as  a level of serum 25-OH  vitamin D less than 20 ng/mL (1,2). The Endocrine Society went on to  further define vitamin D insufficiency as a level between 21 and 29  ng/mL (2). 1. IOM (Institute of Medicine). 2010. Dietary reference intakes for  calcium and D. [place]: The [employer]. 2. Holick MF, Binkley Morrill, Bischoff-Ferrari HA, et al. Evaluation,  treatment, and prevention of vitamin D deficiency: an Endocrine  Society clinical practice guideline, JCEM. 2011 Jul; 96(7):  1911-30. Performed at [facility], [place] [address] [place], [place] [address]   Folate     Status: None   Collection Time: 04/28/19  6:08 PM  Result Value Ref Range   Folate 10.0 >5.9 ng/mL    Comment: Performed at [facility], [place] [address] [place], [place] [id number]  Basic metabolic panel     Status: Abnormal   Collection Time: 04/28/19  6:08 PM  Result Value Ref Range   Sodium 140 135 - 145 mmol/L   Potassium 3.8 3.5 - 5.1 mmol/L   Chloride 105 98 - 111 mmol/L   CO2 27 22 - 32 mmol/L   Glucose, Bld 146 (H) 70 - 99 mg/dL   BUN 24 (H) 8 - 23 mg/dL   Creatinine, Ser 1.35 (H) 0.44 - 1.00 mg/dL   Calcium 9.0 8.9 - 10.3 mg/dL   GFR calc non Af Amer 38 (L) >60 mL/min   GFR calc Af Amer 44 (L) >60 mL/min   Anion gap 8 5 - 15    Comment: Performed at [place] [address], [place], [place] [address]    Blood Alcohol level:  No results found for: [facility]  Metabolic Disorder Labs: Lab Results  Component Value Date   HGBA1C 5.9 (H) 04/25/2019   MPG 122.63 04/25/2019   No results found for: PROLACTIN Lab Results  Component Value Date   CHOL 174 04/25/2019   TRIG 119 04/25/2019   HDL 57 04/25/2019   CHOLHDL 3.1 04/25/2019   VLDL 24 04/25/2019   LDLCALC 93 04/25/2019   LDLCALC 92 05/07/2018    Physical Findings: AIMS: Facial and Oral Movements Muscles of Facial Expression: None, normal Lips and Perioral Area: None, normal Jaw: None, normal Tongue: None, normal,Extremity Movements Upper (arms, wrists, hands, fingers): None, normal Lower (legs, knees, ankles, toes): None, normal, Trunk Movements Neck, shoulders, hips: None, normal, Overall Severity Severity of abnormal movements (highest score from questions above): None, normal Incapacitation due to abnormal movements: None, normal Patient's awareness of abnormal movements (rate only patient's report): No Awareness, Dental Status Current problems with teeth and/or  dentures?: No Does patient usually wear dentures?: No  CIWA:    COWS:     Musculoskeletal: Strength & Muscle Tone: within normal limits Gait & Station: normal Patient leans: N/A  Psychiatric Specialty Exam: Physical Exam  Nursing note and vitals reviewed. Constitutional: She is oriented to person, place, and time. She appears well-developed and well-nourished.  HENT:  Head: Normocephalic and atraumatic.  Respiratory: Effort normal.  Neurological: She is alert and oriented to person, place, and time.    Review of Systems  Blood pressure 116/79, pulse 80, temperature 97.6 F (36.4 C), temperature source Oral, resp. rate 18, height 5\' 5"  (1.651 m), weight 84.4 kg, SpO2 96 %.Body mass index is 30.95 kg/m.  General Appearance: Casual  Eye Contact:  Fair  Speech:  Normal Rate  Volume:  Normal  Mood:  Anxious and Depressed  Affect:  Tearful  Thought Process:  Coherent and Descriptions of Associations: Circumstantial  Orientation:  Full (Time, Place, and Person)  Thought Content:  Rumination  Suicidal Thoughts:  No  Homicidal Thoughts:  No  Memory:  Immediate;   Fair Recent;   Fair Remote;   Fair  Judgement:  Impaired  Insight:  Lacking  Psychomotor Activity:  Increased  Concentration:  Concentration: Fair and Attention Span: Fair  Recall:  [employer] of Knowledge:  Poor  Language:  Good  Akathisia:  Negative  Handed:  Right  AIMS (if indicated):     Assets:  Desire for Improvement Resilience  ADL's:  Intact  Cognition:  Impaired,  Moderate  Sleep:  Number of Hours: 6.75     Treatment Plan Summary: Daily contact with patient to assess and evaluate symptoms and progress in treatment, Medication management and Plan : Patient is seen and examined.  Patient is a 74 year old female with the above-stated past psychiatric history who is seen in follow-up.   Diagnosis: #1 unspecified depression versus major depression, #2 generalized anxiety disorder, #3 unspecified  dementia, #4 hypothyroidism, #5 type 2 diabetes well controlled, #6 history of hypertension, #7 hyperlipidemia.  Patient is seen in follow-up.  Review of the electronic medical record shows that she has had dementia symptoms at least since early 2020.  It may have been earlier than that.  I am going to increase her fluoxetine to 30 mg p.o. daily with a goal of increasing it up to 40 within a couple of days.  I am also going to add buspirone for anxiety.  Clearly medications like hydroxyzine and Benadryl can lead to worsening cognitive status, but right now she is only on 10 mg p.o. 3 times daily as needed anxiety.  We will not change that for now.  We will start BuSpar at 5 mg p.o. twice daily and titrate that.  She has been on the donepezil 5 mg at bedtime since 04/27/2019.  We will wait another day or 2 before increasing that to 10 mg a day to avoid GI side effects.  Her TSH was normal on admission, and she has been prescribed 25 mcg p.o. daily, but I am a bit concerned that because of her memory issues what her compliance was at home.  TSH will have to be followed up.  She is also on an aspirin a day for coronary artery disease protection.  She is also on Lipitor for hyperlipidemia.  As stated above her CT and MRI of the brain from 2020 showed mild to moderate atrophy which would be consistent with an Alzheimer's type dementia.  She apparently has a history of hypertension and was treated with lisinopril, but that has been held since she was in the hospital secondary to being normotensive.  Placement will be an issue, and have asked social work to contact her son who lives in [place] for assistance.  According to the patient he is adopted and he has not had much contact with her since the death of her husband/his father.  It is unclear whether that is true or not given her memory issues.  We will continue the medication changes as listed above and attempt placement of some shape or form.  1.  Continue coated  aspirin 81 mg p.o. daily for heart health. 2.  Continue Lipitor 20 mg p.o. every afternoon for hyperlipidemia. 3.  Continue Aricept 5 mg p.o. nightly for cognitive issues. 4.  Increase fluoxetine to 30 mg p.o. daily for anxiety  and depression. 5.  Continue hydroxyzine 10 mg p.o. 3 times daily as needed anxiety. 6.  Add buspirone 5 mg p.o. twice daily for anxiety and titrate. 7.  Continue levothyroxine 25 mcg p.o. daily for hypothyroidism. 8.  Continue trazodone 50 mg p.o. nightly as needed insomnia. 9.  Disposition planning-in progress.  Sharma Covert, MD 04/29/2019, 12:41 PM

## 2019-04-29 NOTE — Progress Notes (Signed)
Patient denied SI and HI, contracts for safety.  Denied A/V hallucinations.  Denied pain. Medications administered per MD orders.  Emotional support and encouragement given patient. Safety maintained with 15 minute checks.   

## 2019-04-29 NOTE — Progress Notes (Signed)
   04/29/19 2300  Psych Admission Type (Psych Patients Only)  Admission Status Voluntary  Psychosocial Assessment  Patient Complaints Anxiety  Eye Contact Fair  Facial Expression Other (Comment) (appropriate)  Affect Appropriate to circumstance  Speech Logical/coherent  Interaction Assertive  Motor Activity Other (Comment) (WDL)  Appearance/Hygiene Disheveled  Behavior Characteristics Anxious  Mood Anxious  Thought Process  Coherency WDL  Content WDL  Delusions None reported or observed  Perception WDL  Hallucination None reported or observed  Judgment WDL  Confusion None  Danger to Self  Current suicidal ideation? Denies  Danger to Others  Danger to Others None reported or observed

## 2019-04-29 NOTE — BHH Counselor (Signed)
Per psychiatry recommendation, CSW team will pursue placement for this patient to ensure a safe discharge plan.  CSW submitted a PASRR screening which requires an additional review.  PT/OT consults will be ordered to assist in placement. CSW to complete FL2  Stephanie Acre, MSW, [place] Social Worker [facility]  [phone]

## 2019-04-30 LAB — GLUCOSE, CAPILLARY: Glucose-Capillary: 99 mg/dL (ref 70–99)

## 2019-04-30 NOTE — Progress Notes (Signed)
D:  Pt denied SI/HI/AVH. Pt reported that she feels anxious and depressed.    A: Administered Medications per MD orders. Assessed for needs/concerns and provided reassurance when needed. Monitored for safety.  R:  Pt has hard times articulating her thoughts at times and she needed assistance/prompting with word recall.  Pt says that some of the reason for her anxiety and depression is due to the fact that her memory/cognition is declining.  Pt is observed interacting with other peers on the unit.  RN observed pt walking in the hallways with OT this morning.  Pt is safe on the unit.  RN will continue to monitor and provided support as needed.

## 2019-04-30 NOTE — Progress Notes (Signed)
Recreation Therapy Notes  Date:  2.10.21 Time: 0930 Location: 300 Hall Group Room  Group Topic: Stress Management  Goal Area(s) Addresses:  Patient will identify positive stress management techniques. Patient will identify benefits of using stress management post d/c.  Intervention: Stress Management  Activity :  Guided Imagery.  LRT read a script that lead patients to the beach to hear the peaceful waves.  Patients were to listen and follow along as script was read to engage in activity.  Education:  Stress Management, Discharge Planning.   Education Outcome: Acknowledges Education  Clinical Observations/Feedback: Pt did not attend group activity.     Victorino Sparrow, LRT/CTRS         Victorino Sparrow A 04/30/2019 11:26 AM

## 2019-04-30 NOTE — BHH Counselor (Signed)
CSW attempted to complete an APS report regarding home safety concerns expressed by patient's son, Vee Solan, yesterday (02/10). Son stated that patient has not had hot water in her home, due to forgetfulness paying utilities, for approximately 4 months.  CSW called [facility] APS [phone]) and provided information. The APS intake staff informed CSW there were no grounds for a report at this time as patient is not in danger while inpatient and is not immediately returning home.   Stephanie Acre, MSW, [place] Social Worker [facility] Unit  [phone]

## 2019-04-30 NOTE — Evaluation (Addendum)
Occupational Therapy Evaluation Patient Details Name: Kara Huynh MRN: [id number] DOB: [birth year] Today's Date: 04/30/2019    History of Present Illness This 74 year old female was admitted to [facility] with MDD and SI.  PMH:  endometrial CA, anxiety, depression, HTN and probable dementia   Clinical Impression   Pt was admitted for the above.  She is very pleasant and cooperative. Pt needs some supervision for adls, and is independent with others.  She lives alone and would benefit from placement such as ALF with occasional supervision.    Follow Up Recommendations  Other (comment)(ALF, supervision)    Equipment Recommendations  None   Recommendations for Other Services       Precautions / Restrictions Restrictions Weight Bearing Restrictions: No      Mobility Bed Mobility Overal bed mobility: Independent                Transfers Overall transfer level: Independent                    Balance Overall balance assessment: No apparent balance deficits (not formally assessed)                                         ADL either performed or assessed with clinical judgement   ADL Overall ADL's : Needs assistance/impaired     Grooming: Supervision/safety                   Toilet Transfer: Independent   Toileting- Clothing Manipulation and Hygiene: Independent   Tub/ Shower Transfer: Supervision/safety     General ADL Comments: pt can reach and  retrieve clothing independently. She needs supervision, occasional cues for adls for completeness. She needed cue to use correct soap on face also.       Vision         Perception     Praxis      Pertinent Vitals/Pain Pain Assessment: No/denies pain     Hand Dominance     Extremity/Trunk Assessment Upper Extremity Assessment Upper Extremity Assessment: Overall WFL for tasks assessed           Communication Communication Communication: No difficulties   Cognition  Arousal/Alertness: Awake/alert Behavior During Therapy: WFL for tasks assessed/performed Overall Cognitive Status: Impaired/Different from baseline Area of Impairment: Memory;Following commands;Safety/judgement;Problem solving                               General Comments: Pt is aware that her memory is impaired.  She follows sometimes needs cues following one step commands (i.e. told to use bottle of soap by sink instead of antimicrobacterial) for face.  Cues for safety with shower transfers   General Comments       Exercises     Shoulder Instructions      Home Living   Living Arrangements: Alone                                      Prior Functioning/Environment Level of Independence: Independent                 OT Problem List: Decreased cognition;Decreased safety awareness      OT Treatment/Interventions:      OT Goals(Current goals can be  found in the care plan section) Acute Rehab OT Goals OT Goal Formulation: All assessment and education complete, DC therapy  OT Frequency:     Barriers to D/C:            Co-evaluation              AM-PAC OT "6 Clicks" Daily Activity     Outcome Measure Help from another person eating meals?: None Help from another person taking care of personal grooming?: A Little Help from another person toileting, which includes using toliet, bedpan, or urinal?: None Help from another person bathing (including washing, rinsing, drying)?: A Little Help from another person to put on and taking off regular upper body clothing?: None Help from another person to put on and taking off regular lower body clothing?: None 6 Click Score: 22   End of Session    Activity Tolerance: Patient tolerated treatment well Patient left: in bed;with call bell/phone within reach  OT Visit Diagnosis: Cognitive communication deficit (R41.841)                [id number] OT Time Calculation (min): 20 min Charges:   OT General Charges $OT Visit: 1 Visit OT Evaluation $OT Eval Low Complexity: 1 Low  Manjot Beumer S, OTR/L Acute Rehabilitation Services 04/30/2019  [place] 04/30/2019, 9:21 AM

## 2019-04-30 NOTE — Evaluation (Signed)
Physical Therapy Evaluation Patient Details Name: Kara Huynh MRN: [id number] DOB: 1945/05/26 Today's Date: 04/30/2019   History of Present Illness  74 year old female was admitted to [facility] with MDD and SI.  PMH:  endometrial CA, anxiety, depression, HTN and probable dementia  Clinical Impression  Pt is independent with mobility. She ambulated 700' without an assistive device with no loss of balance. Pt stated she's having trouble with memory and that she's not safe to live alone. She was unable to recall if she's had any falls in the past 1 year. Pt would benefit from ALF for safety 2* memory issues. No further PT indicated, will sign off.     Follow Up Recommendations No PT follow up    Equipment Recommendations  None recommended by PT    Recommendations for Other Services       Precautions / Restrictions Precautions Precautions: Fall Precaution Comments: pt can't recall if she's had falls in past 1 year or not Restrictions Weight Bearing Restrictions: No      Mobility  Bed Mobility               General bed mobility comments: NT -up walking in room  Transfers Overall transfer level: Independent Equipment used: None                Ambulation/Gait Ambulation/Gait assistance: Independent Gait Distance (Feet): 700 Feet Assistive device: None Gait Pattern/deviations: WFL(Within Functional Limits) Gait velocity: WNL   General Gait Details: no loss of balance with head turns  Stairs            Wheelchair Mobility    Modified Rankin (Stroke Patients Only)       Balance Overall balance assessment: No apparent balance deficits (not formally assessed)                                           Pertinent Vitals/Pain Pain Assessment: No/denies pain    Home Living Family/patient expects to be discharged to:: Private residence Living Arrangements: Alone   Type of Home: House Home Access: Stairs to enter               Prior Function Level of Independence: Independent         Comments: walks without assistive device     Hand Dominance        Extremity/Trunk Assessment   Upper Extremity Assessment Upper Extremity Assessment: Defer to OT evaluation    Lower Extremity Assessment Lower Extremity Assessment: Overall WFL for tasks assessed(BLEs 5/5)    Cervical / Trunk Assessment Cervical / Trunk Assessment: Normal  Communication   Communication: No difficulties  Cognition Arousal/Alertness: Awake/alert Behavior During Therapy: WFL for tasks assessed/performed Overall Cognitive Status: No family/caregiver present to determine baseline cognitive functioning Area of Impairment: Memory                     Memory: Decreased short-term memory         General Comments: pt oriented to location/situation/self. She follows 1 step commands consistently. She could not recall if she's had falls in the past 1 year ("I don't remember") and stated she doesn't know why she's not in touch with her 1 son. Pt stated she's having trouble with her memory and that she is unsafe to live alone. She hopes to move to ALF.      General Comments  Exercises     Assessment/Plan    PT Assessment Patent does not need any further PT services  PT Problem List         PT Treatment Interventions      PT Goals (Current goals can be found in the Care Plan section)  Acute Rehab PT Goals PT Goal Formulation: All assessment and education complete, DC therapy    Frequency     Barriers to discharge        Co-evaluation               AM-PAC PT "6 Clicks" Mobility  Outcome Measure Help needed turning from your back to your side while in a flat bed without using bedrails?: None Help needed moving from lying on your back to sitting on the side of a flat bed without using bedrails?: None Help needed moving to and from a bed to a chair (including a wheelchair)?: None Help needed standing up  from a chair using your arms (e.g., wheelchair or bedside chair)?: None Help needed to walk in hospital room?: None Help needed climbing 3-5 steps with a railing? : None 6 Click Score: 24    End of Session   Activity Tolerance: Patient tolerated treatment well Patient left: Other (comment)(up in room) Nurse Communication: Mobility status      Time: [id number] PT Time Calculation (min) (ACUTE ONLY): 10 min   Charges:   PT Evaluation $PT Eval Low Complexity: 1 Low         Philomena Doheny PT 04/30/2019  Acute Rehabilitation Services Pager [phone] Office [phone]

## 2019-04-30 NOTE — Progress Notes (Signed)
[facility] MD Progress Note  04/30/2019 10:47 AM Kara Huynh  MRN:  [id number]  Subjective: Kara Huynh reports, "I'm still feeling very depressed, but not as I was when I first came in to the hospital. I'm sleeping a lot because I feel tired. I'm still lying down in my bed because I do want to go to the dayroom. There are nothing good to do in there. I don't watching television".   Patient is a 74 year old female with a probable past psychiatric history significant for major depression, generalized anxiety disorder, probable dementia of unspecified origin (most likely Alzheimer's) who was admitted on 04/24/2019 secondary to suicidal ideation.  Objective: Patient is seen, chart reviewed. The chart findings discussed with the treatment.  Patient remains significantly depressed. She is alert & oriented x 4 today & yet remains significantly forgetful.  Review of the electronic medical record revealed that she has had memory issues since at least 06/2018.  She presented to the emergency department at that time confused.  Her husband had been dead for several years, however, reports indicated that patient was still telling neighbors that he was still alive.  She had CT scan of the brain as well as an MRI of the brain done, and it showed mild to moderate atrophy.  She apparently has been on multiple antidepressants in the past.  She does not recall which ones she has been on in the past.  The attending psychiatrist had discussed contacting patient's son and letting him know that she is in the hospital.  She declined this.  She stated that she had adopted him 3 days after he was born.  She stated that he had only called her "twice in the last 3 years".  I discussed with her the potential that perhaps she might not remember when her son called her because of her memory issues.  Review of her laboratories revealed a negative reversible dementia work-up.  She does not have an anemia, and her MCV was normal suggesting normal folic  acid as well as thiamine.  Her folic acid was measured, and was 10.  Her RPR was negative.  Her TSH was normal at 1.296.  Urinalysis was negative.  Her EKG showed a normal sinus rhythm with a normal QTC.  It looks like the last time she saw anyone for follow-up directly was in February 2020.  She was being treated with fluoxetine 40 mg a day at that time and 25 mcg p.o. daily of levothyroxine.  She also has a history of type 2 diabetes, hypertension and hyperlipidemia.  Psychotherapy was recommended at that time, but she had declined it.  Her vital signs are stable, she is afebrile.  She slept 5.75 hours last night.  The patient's home medication list stated she was on Prozac 40, but because of the patient's memory issues she was unclear on her dosage and 20 mg was continued.  Principal Problem: MDD (major depressive disorder)  Diagnosis: Principal Problem:   MDD (major depressive disorder)  Total Time spent with patient: 15 minutes  Past Psychiatric History: See admission H&P  Past Medical History:  Past Medical History:  Diagnosis Date  . Anxiety   . Arthritis   . Cancer ([place])    uterine  . Depression   . Endometrial polyp 02-17-13   11'14 Dx. endometrial cancer  . Hyperlipidemia   . Hypertension   . Hypothyroidism     Past Surgical History:  Procedure Laterality Date  . APPENDECTOMY    . BREAST  SURGERY     biopsy  . CATARACT EXTRACTION, BILATERAL Bilateral 02-17-13   bilateral  . DILITATION & CURRETTAGE/HYSTROSCOPY WITH VERSAPOINT RESECTION N/A 01/31/2013   Procedure: DILATATION & CURETTAGE/HYSTEROSCOPY WITH CERVICAL BLOCK;  Surgeon: Marylynn Pearson, MD;  Location: [facility];  Service: Gynecology;  Laterality: N/A;  . LAPAROSCOPY FOR ECTOPIC PREGNANCY  1977   SALPINGOSTOMY (SIDE UNKNOWN)  . LAPAROTOMY Bilateral 02/18/2013   Procedure: EXPLORATORY LAPAROTOMY TOTAL ABDMONIAL HYSTERECTOMY BILATERAL SALPINGO OOPHORECTOMY ;  Surgeon: Alvino Chapel, MD;  Location: [facility];   Service: Gynecology;  Laterality: Bilateral;  . TOTAL ABDOMINAL HYSTERECTOMY W/ BILATERAL SALPINGOOPHORECTOMY    . TOTAL KNEE ARTHROPLASTY Bilateral    Family History:  Family History  Problem Relation Age of Onset  . Arthritis Mother   . Mental illness Mother   . Diabetes Mother   . Cancer Father        colon, lung, and prostate  . Arthritis Father   . Hyperlipidemia Father   . Stroke Father   . Heart disease Father   . Hypertension Father   . Colitis Father   . Esophageal cancer Neg Hx   . Rectal cancer Neg Hx   . Stomach cancer Neg Hx   . Breast cancer Neg Hx    Family Psychiatric  History: See admission H&P  Social History:  Social History   Substance and Sexual Activity  Alcohol Use Yes   Comment: twice monthly; had her 1st glass of wine in 2 year      Social History   Substance and Sexual Activity  Drug Use No    Social History   Socioeconomic History  . Marital status: Widowed    Spouse name: Not on file  . Number of children: Not on file  . Years of education: Not on file  . Highest education level: Not on file  Occupational History  . Not on file  Tobacco Use  . Smoking status: Former Smoker    Packs/day: 1.50    Years: 47.00    Pack years: 70.50    Quit date: 07/04/2009    Years since quitting: 9.8  . Smokeless tobacco: Never Used  . Tobacco comment: quit a long time ago  Substance and Sexual Activity  . Alcohol use: Yes    Comment: twice monthly; had her 1st glass of wine in 2 year   . Drug use: No  . Sexual activity: Yes    Partners: Male    Birth control/protection: Post-menopausal  Other Topics Concern  . Not on file  Social History Narrative   Retired from being an Web designer   Married for 35 years   3 step children and 1 adopted child. They live all over the [name]   Social Determinants of Health   Financial Resource Strain:   . Difficulty of Paying Living Expenses: Not on file  Food Insecurity:   . Worried About  Charity fundraiser in the Last Year: Not on file  . Ran Out of Food in the Last Year: Not on file  Transportation Needs:   . Lack of Transportation (Medical): Not on file  . Lack of Transportation (Non-Medical): Not on file  Physical Activity:   . Days of Exercise per Week: Not on file  . Minutes of Exercise per Session: Not on file  Stress:   . Feeling of Stress : Not on file  Social Connections:   . Frequency of Communication with Friends and Family: Not on file  .  Frequency of Social Gatherings with Friends and Family: Not on file  . Attends Religious Services: Not on file  . Active Member of Clubs or Organizations: Not on file  . Attends Archivist Meetings: Not on file  . Marital Status: Not on file   Additional Social History:  Pain Medications: see MAR Prescriptions: see MAR Over the Counter: see MAR History of alcohol / drug use?: No history of alcohol / drug abuse  Sleep: Good  Appetite:  Fair  Current Medications: Current Facility-Administered Medications  Medication Dose Route Frequency Provider Last Rate Last Admin  . acetaminophen (TYLENOL) tablet 650 mg  650 mg Oral Q6H PRN Dixon, Rashaun M, NP      . alum & mag hydroxide-simeth (MAALOX/MYLANTA) 200-200-20 MG/5ML suspension 30 mL  30 mL Oral Q4H PRN Dixon, Rashaun M, NP      . aspirin EC tablet 81 mg  81 mg Oral Daily Cobos, Myer Peer, MD   81 mg at 04/30/19 0849  . atorvastatin (LIPITOR) tablet 20 mg  20 mg Oral q1800 Cobos, Myer Peer, MD   20 mg at 04/29/19 1718  . busPIRone (BUSPAR) tablet 5 mg  5 mg Oral BID Sharma Covert, MD   5 mg at 04/30/19 0849  . donepezil (ARICEPT) tablet 5 mg  5 mg Oral QHS Cobos, Myer Peer, MD   5 mg at 04/29/19 2107  . FLUoxetine (PROZAC) capsule 30 mg  30 mg Oral Daily Sharma Covert, MD   30 mg at 04/30/19 0849  . folic acid (FOLVITE) tablet 1 mg  1 mg Oral Daily Sharma Covert, MD   1 mg at 04/30/19 0849  . hydrOXYzine (ATARAX/VISTARIL) tablet 10 mg  10  mg Oral TID PRN Cobos, Myer Peer, MD   10 mg at 04/29/19 2107  . levothyroxine (SYNTHROID) tablet 25 mcg  25 mcg Oral Q0600 Cobos, Myer Peer, MD   25 mcg at 04/28/19 [phone]  . magnesium hydroxide (MILK OF MAGNESIA) suspension 30 mL  30 mL Oral Daily PRN Dixon, Rashaun M, NP      . thiamine tablet 100 mg  100 mg Oral Daily Sharma Covert, MD   100 mg at 04/30/19 0849  . traZODone (DESYREL) tablet 50 mg  50 mg Oral QHS PRN Deloria Lair, NP       Lab Results:  Results for orders placed or performed during the hospital encounter of 04/24/19 (from the past 48 hour(s))  Vitamin B12     Status: Abnormal   Collection Time: 04/28/19  6:08 PM  Result Value Ref Range   Vitamin B-12 146 (L) 180 - 914 pg/mL    Comment: (NOTE) This assay is not validated for testing neonatal or myeloproliferative syndrome specimens for Vitamin B12 levels. Performed at [facility], [place] [address], [place], [place] [address]   VITAMIN D 25 Hydroxy (Vit-D Deficiency, Fractures)     Status: Abnormal   Collection Time: 04/28/19  6:08 PM  Result Value Ref Range   Vit D, 25-Hydroxy 14.10 (L) 30 - 100 ng/mL    Comment: (NOTE) Vitamin D deficiency has been defined by the [place] practice guideline as a level of serum 25-OH  vitamin D less than 20 ng/mL (1,2). The Endocrine Society went on to  further define vitamin D insufficiency as a level between 21 and 29  ng/mL (2). 1. IOM (Institute of Medicine). 2010. Dietary reference intakes for  calcium  and D. [place]: The [employer]. 2. Holick MF, Binkley [place], Bischoff-Ferrari HA, et al. Evaluation,  treatment, and prevention of vitamin D deficiency: an Endocrine  Society clinical practice guideline, JCEM. 2011 Jul; 96(7): 1911-30. Performed at [facility], [place] [address] [place], [place] [address]   Folate     Status: None   Collection Time: 04/28/19  6:08 PM  Result Value Ref  Range   Folate 10.0 >5.9 ng/mL    Comment: Performed at [facility], [place] [address] [place], [place] [id number]  Basic metabolic panel     Status: Abnormal   Collection Time: 04/28/19  6:08 PM  Result Value Ref Range   Sodium 140 135 - 145 mmol/L   Potassium 3.8 3.5 - 5.1 mmol/L   Chloride 105 98 - 111 mmol/L   CO2 27 22 - 32 mmol/L   Glucose, Bld 146 (H) 70 - 99 mg/dL   BUN 24 (H) 8 - 23 mg/dL   Creatinine, Ser 1.35 (H) 0.44 - 1.00 mg/dL   Calcium 9.0 8.9 - 10.3 mg/dL   GFR calc non Af Amer 38 (L) >60 mL/min   GFR calc Af Amer 44 (L) >60 mL/min   Anion gap 8 5 - 15    Comment: Performed at [facility] [address], [place], [place] [address]  Glucose, capillary     Status: None   Collection Time: 04/30/19  6:00 AM  Result Value Ref Range   Glucose-Capillary 99 70 - 99 mg/dL   Blood Alcohol level:  No results found for: [facility]  Metabolic Disorder Labs: Lab Results  Component Value Date   HGBA1C 5.9 (H) 04/25/2019   MPG 122.63 04/25/2019   No results found for: PROLACTIN Lab Results  Component Value Date   CHOL 174 04/25/2019   TRIG 119 04/25/2019   HDL 57 04/25/2019   CHOLHDL 3.1 04/25/2019   VLDL 24 04/25/2019   LDLCALC 93 04/25/2019   LDLCALC 92 05/07/2018   Physical Findings: AIMS: Facial and Oral Movements Muscles of Facial Expression: None, normal Lips and Perioral Area: None, normal Jaw: None, normal Tongue: None, normal,Extremity Movements Upper (arms, wrists, hands, fingers): None, normal Lower (legs, knees, ankles, toes): None, normal, Trunk Movements Neck, shoulders, hips: None, normal, Overall Severity Severity of abnormal movements (highest score from questions above): None, normal Incapacitation due to abnormal movements: None, normal Patient's awareness of abnormal movements (rate only patient's report): No Awareness, Dental Status Current problems with teeth and/or dentures?: No Does patient usually wear  dentures?: No  CIWA:    COWS:     Musculoskeletal: Strength & Muscle Tone: within normal limits Gait & Station: normal Patient leans: N/A  Psychiatric Specialty Exam: Physical Exam  Nursing note and vitals reviewed. Constitutional: She is oriented to person, place, and time. She appears well-developed and well-nourished.  HENT:  Head: Normocephalic and atraumatic.  Cardiovascular: Normal rate.  Respiratory: Effort normal.  Genitourinary:    Genitourinary Comments: Deferred   Musculoskeletal:        General: Normal range of motion.     Cervical back: Normal range of motion.  Neurological: She is alert and oriented to person, place, and time.  Skin: Skin is warm.    Review of Systems  Constitutional: Negative for chills, diaphoresis and fever.  HENT: Negative for congestion, rhinorrhea, sneezing and sore throat.   Respiratory: Negative for cough, shortness of breath and wheezing.   Cardiovascular: Negative for chest pain and  palpitations.  Genitourinary: Negative for difficulty urinating.  Skin: Negative for color change.  Allergic/Immunologic: Negative for environmental allergies and food allergies.  Neurological: Negative for dizziness and headaches.  Psychiatric/Behavioral: Positive for confusion, decreased concentration, dysphoric mood and hallucinations. Negative for agitation, behavioral problems, self-injury, sleep disturbance and suicidal ideas. The patient is nervous/anxious. The patient is not hyperactive.     Blood pressure 135/75, pulse 75, temperature 98.4 F (36.9 C), temperature source Oral, resp. rate 18, height 5\' 5"  (1.651 m), weight 84.4 kg, SpO2 96 %.Body mass index is 30.95 kg/m.  General Appearance: Casual  Eye Contact:  Fair  Speech:  Normal Rate  Volume:  Normal  Mood:  Anxious and Depressed  Affect:  Tearful  Thought Process:  Coherent and Descriptions of Associations: Circumstantial  Orientation:  Full (Time, Place, and Person)  Thought Content:   Rumination  Suicidal Thoughts:  No  Homicidal Thoughts:  No  Memory:  Immediate;   Fair Recent;   Fair Remote;   Fair  Judgement:  Impaired  Insight:  Lacking  Psychomotor Activity:  Increased  Concentration:  Concentration: Fair and Attention Span: Fair  Recall:  [employer] of Knowledge:  Poor  Language:  Good  Akathisia:  Negative  Handed:  Right  AIMS (if indicated):     Assets:  Desire for Improvement Resilience  ADL's:  Intact  Cognition:  Impaired,  Moderate  Sleep:  Number of Hours: 5.75   Treatment Plan Summary: Daily contact with patient to assess and evaluate symptoms and progress in treatment, Medication management and Plan : Patient is seen and examined.  Patient is a 74 year old female with the above-stated past psychiatric history who is seen in follow-up.   -Continue inpatient hospitalization.  -Will continue today 04/30/2019 plan as below except where it is noted.  Diagnosis: #1 unspecified depression versus major depression, #2 generalized anxiety disorder,  #3 unspecified dementia,  #4 hypothyroidism,  #5 type 2 diabetes well controlled,  #6 history of hypertension,  #7 hyperlipidemia.  (Previous documentation). Patient is seen in follow-up.  Review of the electronic medical record shows that she has had dementia symptoms at least since early 2020.  It may have been earlier than that.  I am going to increase her fluoxetine to 30 mg p.o. daily with a goal of increasing it up to 40 within a couple of days.  I am also going to add buspirone for anxiety.  Clearly medications like hydroxyzine and Benadryl can lead to worsening cognitive status, but right now she is only on 10 mg p.o. 3 times daily as needed anxiety.  We will not change that for now.  We will start BuSpar at 5 mg p.o. twice daily and titrate that.  She has been on the donepezil 5 mg at bedtime since 04/27/2019.  We will wait another day or 2 before increasing that to 10 mg a day to avoid GI side effects.   Her TSH was normal on admission, and she has been prescribed 25 mcg p.o. daily, but I am a bit concerned that because of her memory issues what her compliance was at home.  TSH will have to be followed up.  She is also on an aspirin a day for coronary artery disease protection.  She is also on Lipitor for hyperlipidemia.  As stated above her CT and MRI of the brain from 2020 showed mild to moderate atrophy which would be consistent with an Alzheimer's type dementia.  She apparently has a history  of hypertension and was treated with lisinopril, but that has been held since she was in the hospital secondary to being normotensive.  Placement will be an issue, and have asked social work to contact her son who lives in [place] for assistance.  According to the patient he is adopted and he has not had much contact with her since the death of her husband/his father.  It is unclear whether that is true or not given her memory issues.  We will continue the medication changes as listed above and attempt placement of some shape or form.  1.  Continue coated aspirin 81 mg p.o. daily for heart health. 2.  Continue Lipitor 20 mg p.o. every afternoon for hyperlipidemia. 3.  Continue Aricept 5 mg p.o. nightly for cognitive issues. 4.  Continue fluoxetine to 30 mg p.o. daily for anxiety and depression. 5.  Continue hydroxyzine 10 mg p.o. 3 times daily as needed anxiety. 6.  Continue buspirone 5 mg p.o. twice daily for anxiety and titrate. 7.  Continue levothyroxine 25 mcg p.o. daily for hypothyroidism. 8.  Continue trazodone 50 mg p.o. nightly as needed insomnia. 9.  Disposition planning-in progress.  Lindell Spar, NP, PMHNP, FNP_BC 04/30/2019, 10:47 AMPatient ID: Lester Kinsman, female   DOB: 12-14-1945, 74 y.o.   MRN: [id number]

## 2019-05-01 ENCOUNTER — Inpatient Hospital Stay (HOSPITAL_COMMUNITY): Payer: Medicare HMO

## 2019-05-01 DIAGNOSIS — R45851 Suicidal ideations: Secondary | ICD-10-CM

## 2019-05-01 LAB — GLUCOSE, CAPILLARY: Glucose-Capillary: 100 mg/dL — ABNORMAL HIGH (ref 70–99)

## 2019-05-01 MED ORDER — DONEPEZIL HCL 5 MG PO TABS
10.0000 mg | ORAL_TABLET | Freq: Every day | ORAL | Status: DC
  Administered 2019-05-01 – 2019-05-13 (×13): 10 mg via ORAL
  Filled 2019-05-01 (×16): qty 2

## 2019-05-01 MED ORDER — BUSPIRONE HCL 7.5 MG PO TABS
7.5000 mg | ORAL_TABLET | Freq: Two times a day (BID) | ORAL | Status: DC
  Administered 2019-05-01 – 2019-05-05 (×8): 7.5 mg via ORAL
  Filled 2019-05-01 (×12): qty 1

## 2019-05-01 MED ORDER — VITAMIN D (ERGOCALCIFEROL) 1.25 MG (50000 UNIT) PO CAPS
50000.0000 [IU] | ORAL_CAPSULE | ORAL | Status: DC
  Administered 2019-05-01 – 2019-05-08 (×2): 50000 [IU] via ORAL
  Filled 2019-05-01 (×3): qty 1

## 2019-05-01 MED ORDER — MEMANTINE HCL 5 MG PO TABS
5.0000 mg | ORAL_TABLET | Freq: Every day | ORAL | Status: DC
  Administered 2019-05-01 – 2019-05-05 (×5): 5 mg via ORAL
  Filled 2019-05-01 (×7): qty 1

## 2019-05-01 MED ORDER — ALBUTEROL SULFATE HFA 108 (90 BASE) MCG/ACT IN AERS
1.0000 | INHALATION_SPRAY | Freq: Four times a day (QID) | RESPIRATORY_TRACT | Status: DC
  Administered 2019-05-01 – 2019-05-03 (×2): 2 via RESPIRATORY_TRACT
  Administered 2019-05-03: 1 via RESPIRATORY_TRACT
  Administered 2019-05-03 – 2019-05-07 (×15): 2 via RESPIRATORY_TRACT
  Filled 2019-05-01: qty 6.7

## 2019-05-01 MED ORDER — FLUTICASONE PROPIONATE HFA 44 MCG/ACT IN AERO
2.0000 | INHALATION_SPRAY | Freq: Two times a day (BID) | RESPIRATORY_TRACT | Status: DC
  Administered 2019-05-03 – 2019-05-14 (×21): 2 via RESPIRATORY_TRACT
  Filled 2019-05-01: qty 10.6

## 2019-05-01 NOTE — Progress Notes (Signed)
   05/01/19 0015  Psych Admission Type (Psych Patients Only)  Admission Status Voluntary  Psychosocial Assessment  Patient Complaints Worrying  Eye Contact Fair  Facial Expression Other (Comment) (appropriate)  Affect Appropriate to circumstance  Speech Logical/coherent  Interaction Assertive  Motor Activity Other (Comment) (WDL)  Appearance/Hygiene Unremarkable  Behavior Characteristics Cooperative  Mood Pleasant  Aggressive Behavior  Effect No apparent injury  Thought Process  Coherency WDL  Content WDL  Delusions None reported or observed  Perception WDL  Hallucination None reported or observed  Judgment WDL  Confusion None  Danger to Self  Current suicidal ideation? Denies  Danger to Others  Danger to Others None reported or observed

## 2019-05-01 NOTE — Progress Notes (Signed)
[facility] MD Progress Note  05/01/2019 2:38 PM Kara Huynh  MRN:  [id number] Subjective:  Patient is a 74 year old female with a probable past psychiatric history significant for major depression, generalized anxiety disorder, probable dementia of unspecified origin (most likely Alzheimer's) who was admitted on 04/24/2019 secondary to suicidal ideation.  Objective: Patient is seen and examined.  Patient is a 74 year old female with the above-stated past psychiatric history is seen in follow-up.  She states she feels a bit better today.  She stated "I am not alone".  She discussed how lonely she is living in her condo.  She denied any problems with her current medication.  She has been coughing a significant amount today.  She is a smoker, and review of the electronic medical record did not reveal any recent chest x-ray.  No fevers that were noted.  I told her that we were going to increase the Aricept, add memantine, and increase her BuSpar.  She does have a history of diabetes type 2, but her hemoglobin A1c was only 5.9.  That may have been secondary to not really getting enough food at home because of her memory issues.  She denied suicidal ideation today, but stated "I am ready to go".  Her vital signs are stable, she is afebrile.  She slept 6.75 hours last night.  Principal Problem: MDD (major depressive disorder) Diagnosis: Principal Problem:   MDD (major depressive disorder) Active Problems:   Suicidal ideation  Total Time spent with patient: 15 minutes  Past Psychiatric History: See admission H&P  Past Medical History:  Past Medical History:  Diagnosis Date  . Anxiety   . Arthritis   . Cancer ([place])    uterine  . Depression   . Endometrial polyp 02-17-13   11'14 Dx. endometrial cancer  . Hyperlipidemia   . Hypertension   . Hypothyroidism     Past Surgical History:  Procedure Laterality Date  . APPENDECTOMY    . BREAST SURGERY     biopsy  . CATARACT EXTRACTION, BILATERAL Bilateral  02-17-13   bilateral  . DILITATION & CURRETTAGE/HYSTROSCOPY WITH VERSAPOINT RESECTION N/A 01/31/2013   Procedure: DILATATION & CURETTAGE/HYSTEROSCOPY WITH CERVICAL BLOCK;  Surgeon: Marylynn Pearson, MD;  Location: [facility];  Service: Gynecology;  Laterality: N/A;  . LAPAROSCOPY FOR ECTOPIC PREGNANCY  1977   SALPINGOSTOMY (SIDE UNKNOWN)  . LAPAROTOMY Bilateral 02/18/2013   Procedure: EXPLORATORY LAPAROTOMY TOTAL ABDMONIAL HYSTERECTOMY BILATERAL SALPINGO OOPHORECTOMY ;  Surgeon: Alvino Chapel, MD;  Location: [facility];  Service: Gynecology;  Laterality: Bilateral;  . TOTAL ABDOMINAL HYSTERECTOMY W/ BILATERAL SALPINGOOPHORECTOMY    . TOTAL KNEE ARTHROPLASTY Bilateral    Family History:  Family History  Problem Relation Age of Onset  . Arthritis Mother   . Mental illness Mother   . Diabetes Mother   . Cancer Father        colon, lung, and prostate  . Arthritis Father   . Hyperlipidemia Father   . Stroke Father   . Heart disease Father   . Hypertension Father   . Colitis Father   . Esophageal cancer Neg Hx   . Rectal cancer Neg Hx   . Stomach cancer Neg Hx   . Breast cancer Neg Hx    Family Psychiatric  History: See admission H&P Social History:  Social History   Substance and Sexual Activity  Alcohol Use Yes   Comment: twice monthly; had her 1st glass of wine in 2 year      Social History  Substance and Sexual Activity  Drug Use No    Social History   Socioeconomic History  . Marital status: Widowed    Spouse name: Not on file  . Number of children: Not on file  . Years of education: Not on file  . Highest education level: Not on file  Occupational History  . Not on file  Tobacco Use  . Smoking status: Former Smoker    Packs/day: 1.50    Years: 47.00    Pack years: 70.50    Quit date: 07/04/2009    Years since quitting: 9.8  . Smokeless tobacco: Never Used  . Tobacco comment: quit a long time ago  Substance and Sexual Activity  . Alcohol use: Yes     Comment: twice monthly; had her 1st glass of wine in 2 year   . Drug use: No  . Sexual activity: Yes    Partners: Male    Birth control/protection: Post-menopausal  Other Topics Concern  . Not on file  Social History Narrative   Retired from being an Web designer   Married for 35 years   3 step children and 1 adopted child. They live all over the [name]   Social Determinants of Health   Financial Resource Strain:   . Difficulty of Paying Living Expenses: Not on file  Food Insecurity:   . Worried About Charity fundraiser in the Last Year: Not on file  . Ran Out of Food in the Last Year: Not on file  Transportation Needs:   . Lack of Transportation (Medical): Not on file  . Lack of Transportation (Non-Medical): Not on file  Physical Activity:   . Days of Exercise per Week: Not on file  . Minutes of Exercise per Session: Not on file  Stress:   . Feeling of Stress : Not on file  Social Connections:   . Frequency of Communication with Friends and Family: Not on file  . Frequency of Social Gatherings with Friends and Family: Not on file  . Attends Religious Services: Not on file  . Active Member of Clubs or Organizations: Not on file  . Attends Archivist Meetings: Not on file  . Marital Status: Not on file   Additional Social History:    Pain Medications: see MAR Prescriptions: see MAR Over the Counter: see MAR History of alcohol / drug use?: No history of alcohol / drug abuse                    Sleep: Good  Appetite:  Fair  Current Medications: Current Facility-Administered Medications  Medication Dose Route Frequency Provider Last Rate Last Admin  . acetaminophen (TYLENOL) tablet 650 mg  650 mg Oral Q6H PRN Dixon, Rashaun M, NP      . albuterol (VENTOLIN HFA) 108 (90 Base) MCG/ACT inhaler 1-2 puff  1-2 puff Inhalation QID Sharma Covert, MD      . aspirin EC tablet 81 mg  81 mg Oral Daily Cobos, Myer Peer, MD   81 mg at 05/01/19 0759   . atorvastatin (LIPITOR) tablet 20 mg  20 mg Oral q1800 Cobos, Myer Peer, MD   20 mg at 04/30/19 1711  . busPIRone (BUSPAR) tablet 7.5 mg  7.5 mg Oral BID Sharma Covert, MD      . donepezil (ARICEPT) tablet 10 mg  10 mg Oral QHS Sharma Covert, MD      . FLUoxetine (PROZAC) capsule 30 mg  30 mg Oral  Daily Sharma Covert, MD   30 mg at 05/01/19 0800  . fluticasone (FLOVENT HFA) 44 MCG/ACT inhaler 2 puff  2 puff Inhalation BID Sharma Covert, MD      . folic acid (FOLVITE) tablet 1 mg  1 mg Oral Daily Sharma Covert, MD   1 mg at 05/01/19 0759  . hydrOXYzine (ATARAX/VISTARIL) tablet 10 mg  10 mg Oral TID PRN Cobos, Myer Peer, MD   10 mg at 04/29/19 2107  . levothyroxine (SYNTHROID) tablet 25 mcg  25 mcg Oral Q0600 Cobos, Myer Peer, MD   25 mcg at 05/01/19 [phone]  . magnesium hydroxide (MILK OF MAGNESIA) suspension 30 mL  30 mL Oral Daily PRN Dixon, Rashaun M, NP      . memantine (NAMENDA) tablet 5 mg  5 mg Oral Daily Sharma Covert, MD   5 mg at 05/01/19 1315  . thiamine tablet 100 mg  100 mg Oral Daily Sharma Covert, MD   100 mg at 05/01/19 0759  . traZODone (DESYREL) tablet 50 mg  50 mg Oral QHS PRN Deloria Lair, NP   50 mg at 04/30/19 2151  . Vitamin D (Ergocalciferol) (DRISDOL) capsule 50,000 Units  50,000 Units Oral Q7 days Sharma Covert, MD   50,000 Units at 05/01/19 1400    Lab Results:  Results for orders placed or performed during the hospital encounter of 04/24/19 (from the past 48 hour(s))  Glucose, capillary     Status: None   Collection Time: 04/30/19  6:00 AM  Result Value Ref Range   Glucose-Capillary 99 70 - 99 mg/dL  Glucose, capillary     Status: Abnormal   Collection Time: 05/01/19  5:55 AM  Result Value Ref Range   Glucose-Capillary 100 (H) 70 - 99 mg/dL    Blood Alcohol level:  No results found for: [facility]  Metabolic Disorder Labs: Lab Results  Component Value Date   HGBA1C 5.9 (H) 04/25/2019   MPG 122.63 04/25/2019   No  results found for: PROLACTIN Lab Results  Component Value Date   CHOL 174 04/25/2019   TRIG 119 04/25/2019   HDL 57 04/25/2019   CHOLHDL 3.1 04/25/2019   VLDL 24 04/25/2019   LDLCALC 93 04/25/2019   LDLCALC 92 05/07/2018    Physical Findings: AIMS: Facial and Oral Movements Muscles of Facial Expression: None, normal Lips and Perioral Area: None, normal Jaw: None, normal Tongue: None, normal,Extremity Movements Upper (arms, wrists, hands, fingers): None, normal Lower (legs, knees, ankles, toes): None, normal, Trunk Movements Neck, shoulders, hips: None, normal, Overall Severity Severity of abnormal movements (highest score from questions above): None, normal Incapacitation due to abnormal movements: None, normal Patient's awareness of abnormal movements (rate only patient's report): No Awareness, Dental Status Current problems with teeth and/or dentures?: No Does patient usually wear dentures?: No  CIWA:    COWS:     Musculoskeletal: Strength & Muscle Tone: within normal limits Gait & Station: normal Patient leans: N/A  Psychiatric Specialty Exam: Physical Exam  Nursing note and vitals reviewed. Constitutional: She is oriented to person, place, and time. She appears well-developed and well-nourished.  HENT:  Head: Normocephalic and atraumatic.  Respiratory: Effort normal.  Neurological: She is alert and oriented to person, place, and time.    Review of Systems  Blood pressure 135/66, pulse 70, temperature 98.1 F (36.7 C), temperature source Oral, resp. rate 18, height 5\' 5"  (1.651 m), weight 84.4 kg, SpO2 96 %.Body mass index is 30.95 kg/m.  General Appearance: Casual  Eye Contact:  Fair  Speech:  Normal Rate  Volume:  Normal  Mood:  Anxious  Affect:  Congruent  Thought Process:  Coherent and Descriptions of Associations: Circumstantial  Orientation:  Full (Time, Place, and Person)  Thought Content:  Rumination  Suicidal Thoughts:  Yes.  without intent/plan   Homicidal Thoughts:  No  Memory:  Immediate;   Poor Recent;   Poor Remote;   Poor  Judgement:  Impaired  Insight:  Fair  Psychomotor Activity:  Increased  Concentration:  Concentration: Fair and Attention Span: Fair  Recall:  Poor  Fund of Knowledge:  Fair  Language:  Good  Akathisia:  Negative  Handed:  Right  AIMS (if indicated):     Assets:  Desire for Improvement Resilience  ADL's:  Intact  Cognition:  Impaired,  Moderate  Sleep:  Number of Hours: 6.75     Treatment Plan Summary: Daily contact with patient to assess and evaluate symptoms and progress in treatment, Medication management and Plan : Patient is seen and examined.  Patient is a 74 year old female with the above-stated past medical and psychiatric history who is seen in follow-up.   Diagnosis: #1 unspecified depression versus major depression, #2 generalized anxiety disorder, #3 unspecified dementia, #4 hypothyroidism, #5 type 2 diabetes well controlled, #6 history of hypertension, #7 hyperlipidemia, #8 probable COPD, #9 cough  Patient is seen in follow-up.  Her anxiety seems to be a little bit better.  I am going to increase her BuSpar to 7.5 mg p.o. twice daily.  Her fluoxetine was just increased yesterday.  I will go on and increase her Aricept to 10 mg p.o. nightly, and we will start memantine tomorrow at 5 mg p.o. daily.  I am going to write for daily blood sugars just to make sure that her blood sugar does not go up given increased availability of food and her past history of diabetes.  She also has developed a cough over the last 24 to 36 hours, and given her smoking history and a great deal of time that she spending in the bed I am going to get a chest x-ray to make sure about that.  I am also going to add albuterol inhalers as well as Pulmicort for her COPD symptoms.  Placement will continue to be primary here.  Her son told social work that they are unable to allow her to come to their home in [place], and  clearly she cannot return to her home for safety reasons.  The son stated that her water had been turned off in the home because of her confusion over paying bills.  Social work did attempt to make a report to the social organizations to investigate her safety in the home, but they declined saying that she was in the hospital currently.  No other changes then are listed above.  1.  Continue coated aspirin 81 mg p.o. daily for heart health. 2.  Continue Lipitor 20 mg p.o. every afternoon for hyperlipidemia. 3.  Increase Aricept to 10 mg p.o. nightly for cognitive issues. 4.  Continue fluoxetine to 30 mg p.o. daily for anxiety and depression. 5.  Continue hydroxyzine 10 mg p.o. 3 times daily as needed anxiety. 6.  Increase buspirone to 7.5 mg p.o. twice daily for anxiety and titrate. 7.  Continue levothyroxine 25 mcg p.o. daily for hypothyroidism. 8.  Continue trazodone 50 mg p.o. nightly as needed insomnia. 9.  Check blood sugar daily for diabetes mellitus type 2.  10.  Add memantine 5 mg p.o. daily for memory starting tomorrow. 11.  Chest x-ray today secondary to cough. 12.  Add albuterol inhaler 2 puffs 4 times daily for COPD symptoms and cough. 13.  Add Pulmicort equivalent for COPD. 14. Disposition planning-in progress.  Sharma Covert, MD 05/01/2019, 2:38 PM

## 2019-05-01 NOTE — Progress Notes (Signed)
   05/01/19 2000  Psych Admission Type (Psych Patients Only)  Admission Status Voluntary  Psychosocial Assessment  Patient Complaints Worrying  Eye Contact Fair  Facial Expression Other (Comment) (appropriate)  Affect Appropriate to circumstance  Speech Logical/coherent  Interaction Assertive  Motor Activity Other (Comment) (WDL)  Appearance/Hygiene Unremarkable  Behavior Characteristics Cooperative  Mood Pleasant  Aggressive Behavior  Effect No apparent injury  Thought Process  Coherency WDL  Content WDL  Delusions None reported or observed  Perception WDL  Hallucination None reported or observed  Judgment WDL  Confusion None  Danger to Self  Current suicidal ideation? Denies  Danger to Others  Danger to Others None reported or observed

## 2019-05-01 NOTE — Progress Notes (Signed)
D:  Patient denied SI and HI, contracts for safety.  Denied A/V hallucinations.  Denied pain. A:  Medications administered per MD orders.  Emotional support and encouragement given patient. R:  Safety maintained with 15 minute checks.  

## 2019-05-01 NOTE — Plan of Care (Signed)
Nurse discussed anxiety, depression and coping skills with patient.  

## 2019-05-02 LAB — GLUCOSE, CAPILLARY: Glucose-Capillary: 96 mg/dL (ref 70–99)

## 2019-05-02 MED ORDER — ARIPIPRAZOLE 2 MG PO TABS
2.0000 mg | ORAL_TABLET | Freq: Every day | ORAL | Status: DC
  Administered 2019-05-02 – 2019-05-08 (×7): 2 mg via ORAL
  Filled 2019-05-02 (×8): qty 1

## 2019-05-02 NOTE — Plan of Care (Signed)
Progress note  D: pt found in bed; compliant with medication administration. Pt is animated and anxious on assessment. Pt denies any physical issues or complaints. Pt does still have symptoms or depression surrounding the death of the husband. Pt continues to be disoriented to time, stating that she is 74 yo. Pt denies si/hi/ah/vh and verbally agrees to approach staff if these become apparent or before harming themself/others while at [facility].  A: Pt provided support and encouragement. Pt given medication per protocol and standing orders. Q66m safety checks implemented and continued.  R: Pt safe on the unit. Will continue to monitor.  Pt progressing in the following metrics  Problem: Education: Goal: Knowledge of [facility] Education information/materials will improve Outcome: Progressing Goal: Emotional status will improve Outcome: Progressing Goal: Mental status will improve Outcome: Progressing

## 2019-05-02 NOTE — Progress Notes (Signed)
Patient attended AA group meeting and participated.  

## 2019-05-02 NOTE — [facility] Group Notes (Signed)
LCSW Aftercare Discharge Planning Group Note  05/02/2019   Type of Group and Topic: Psychoeducational Group: Discharge Planning  Participation Level: Did Not Attend  Description of Group  Discharge planning group reviews patient's anticipated discharge plans and assists patients to anticipate and address any barriers to wellness/recovery in the community. Suicide prevention education is reviewed with patients in group.  Therapeutic Goals  1. Patients will state their anticipated discharge plan and mental health aftercare  2. Patients will identify potential barriers to wellness in the community setting  3. Patients will engage in problem solving, solution focused discussion of ways to anticipate and address barriers to wellness/recovery  Summary of Patient Progress  Plan for Discharge/Comments:  Transportation Means:  Supports:  Therapeutic Modalities:  Harrison, Stanton  05/02/2019 2:02 PM

## 2019-05-02 NOTE — Progress Notes (Signed)
[facility] MD Progress Note  05/02/2019 12:50 PM Kara Huynh  MRN:  [id number]  Subjective: Kara Huynh reports, "I did not get any sleep last night. I was thinking, thinking & thinking about life, how I got to this point. But, I feel Mellow, but in a good way. I tried to fall asleep last night, but my mind won't shut off. Today my depression is at #7 & anxiety @ #5 in the scale of (1-10 ) (10 being the worst depressed & one being the least depressed).     Patient is a 74 year old female with a probable past psychiatric history significant for major depression, generalized anxiety disorder, probable dementia of unspecified origin (most likely Alzheimer's) who was admitted on 04/24/2019 secondary to suicidal ideation.  Objective: Patient is seen, chart reviewed. The chart findings discussed with the treatment. She is alert & oriented x 3 today & yet remains significantly forgetful.  Previous review of the electronic medical record revealed that she has had memory issues since at least 06/2018.  She presented to the emergency department at that time confused.  Her husband had been dead for several years, however, reports indicated that patient was still telling neighbors that he was still alive.  She had CT scan of the brain as well as an MRI of the brain done, and it showed mild to moderate atrophy.  She apparently has been on multiple antidepressants in the past.  She does not recall which ones she has been on in the past.  The attending psychiatrist had discussed contacting patient's son and letting him know that she is in the hospital.  She declined this.  She stated that she had adopted him 3 days after he was born.  She stated that he had only called her "twice in the last 3 years". The attending psychiatrist had discussed with her the potential that perhaps she might not remember when her son called her because of her memory issues.  Review of her laboratories revealed a negative reversible dementia work-up.  She  does not have an anemia, and her MCV was normal suggesting normal folic acid as well as thiamine.  Her folic acid was measured, and was 10.  Her RPR was negative.  Her TSH was normal at 1.296.  Urinalysis was negative.  Her EKG showed a normal sinus rhythm with a normal QTC. A new Ekg has been ordered to be done on 05-05-19 to check for any cardiac changes from her baseline as she is being started on Abilify 2 mg tonight for racing thoughts & to augment her antidepressant. It looks like the last time she saw anyone for follow-up directly was in February 2020.  She was being treated with fluoxetine 40 mg a day at that time and 25 mcg p.o. daily of levothyroxine.  She also has a history of type 2 diabetes, hypertension and hyperlipidemia.  Psychotherapy was recommended at that time, but she had declined it.  Her vital signs are stable, she is afebrile.  She slept 8.75 hours last night, although she had reported problem sleeping last night.  The patient's home medication list stated she was on Prozac 40, but because of the patient's memory issues she was unclear on her dosage and 20 mg was continued. She is being encouraged to come out of her room & attend group sessions. Kara Huynh does not appear to be responding to any internal stimuli.  Principal Problem: MDD (major depressive disorder)  Diagnosis: Principal Problem:   MDD (major depressive disorder)  Active Problems:   Suicidal ideation  Total Time spent with patient: 15 minutes  Past Psychiatric History: See admission H&P  Past Medical History:  Past Medical History:  Diagnosis Date  . Anxiety   . Arthritis   . Cancer ([place])    uterine  . Depression   . Endometrial polyp 02-17-13   11'14 Dx. endometrial cancer  . Hyperlipidemia   . Hypertension   . Hypothyroidism     Past Surgical History:  Procedure Laterality Date  . APPENDECTOMY    . BREAST SURGERY     biopsy  . CATARACT EXTRACTION, BILATERAL Bilateral 02-17-13   bilateral  .  DILITATION & CURRETTAGE/HYSTROSCOPY WITH VERSAPOINT RESECTION N/A 01/31/2013   Procedure: DILATATION & CURETTAGE/HYSTEROSCOPY WITH CERVICAL BLOCK;  Surgeon: Marylynn Pearson, MD;  Location: [facility];  Service: Gynecology;  Laterality: N/A;  . LAPAROSCOPY FOR ECTOPIC PREGNANCY  1977   SALPINGOSTOMY (SIDE UNKNOWN)  . LAPAROTOMY Bilateral 02/18/2013   Procedure: EXPLORATORY LAPAROTOMY TOTAL ABDMONIAL HYSTERECTOMY BILATERAL SALPINGO OOPHORECTOMY ;  Surgeon: Alvino Chapel, MD;  Location: [facility];  Service: Gynecology;  Laterality: Bilateral;  . TOTAL ABDOMINAL HYSTERECTOMY W/ BILATERAL SALPINGOOPHORECTOMY    . TOTAL KNEE ARTHROPLASTY Bilateral    Family History:  Family History  Problem Relation Age of Onset  . Arthritis Mother   . Mental illness Mother   . Diabetes Mother   . Cancer Father        colon, lung, and prostate  . Arthritis Father   . Hyperlipidemia Father   . Stroke Father   . Heart disease Father   . Hypertension Father   . Colitis Father   . Esophageal cancer Neg Hx   . Rectal cancer Neg Hx   . Stomach cancer Neg Hx   . Breast cancer Neg Hx    Family Psychiatric  History: See admission H&P  Social History:  Social History   Substance and Sexual Activity  Alcohol Use Yes   Comment: twice monthly; had her 1st glass of wine in 2 year      Social History   Substance and Sexual Activity  Drug Use No    Social History   Socioeconomic History  . Marital status: Widowed    Spouse name: Not on file  . Number of children: Not on file  . Years of education: Not on file  . Highest education level: Not on file  Occupational History  . Not on file  Tobacco Use  . Smoking status: Former Smoker    Packs/day: 1.50    Years: 47.00    Pack years: 70.50    Quit date: 07/04/2009    Years since quitting: 9.8  . Smokeless tobacco: Never Used  . Tobacco comment: quit a long time ago  Substance and Sexual Activity  . Alcohol use: Yes    Comment: twice monthly; had  her 1st glass of wine in 2 year   . Drug use: No  . Sexual activity: Yes    Partners: Male    Birth control/protection: Post-menopausal  Other Topics Concern  . Not on file  Social History Narrative   Retired from being an Web designer   Married for 35 years   3 step children and 1 adopted child. They live all over the [name]   Social Determinants of Health   Financial Resource Strain:   . Difficulty of Paying Living Expenses: Not on file  Food Insecurity:   . Worried About Charity fundraiser in the Last  Year: Not on file  . Ran Out of Food in the Last Year: Not on file  Transportation Needs:   . Lack of Transportation (Medical): Not on file  . Lack of Transportation (Non-Medical): Not on file  Physical Activity:   . Days of Exercise per Week: Not on file  . Minutes of Exercise per Session: Not on file  Stress:   . Feeling of Stress : Not on file  Social Connections:   . Frequency of Communication with Friends and Family: Not on file  . Frequency of Social Gatherings with Friends and Family: Not on file  . Attends Religious Services: Not on file  . Active Member of Clubs or Organizations: Not on file  . Attends Archivist Meetings: Not on file  . Marital Status: Not on file   Additional Social History:  Pain Medications: see MAR Prescriptions: see MAR Over the Counter: see MAR History of alcohol / drug use?: No history of alcohol / drug abuse  Sleep: Good  Appetite:  Fair  Current Medications: Current Facility-Administered Medications  Medication Dose Route Frequency Provider Last Rate Last Admin  . acetaminophen (TYLENOL) tablet 650 mg  650 mg Oral Q6H PRN Dixon, Rashaun M, NP      . albuterol (VENTOLIN HFA) 108 (90 Base) MCG/ACT inhaler 1-2 puff  1-2 puff Inhalation QID Sharma Covert, MD   2 puff at 05/01/19 1545  . ARIPiprazole (ABILIFY) tablet 2 mg  2 mg Oral QHS Midas Daughety I, NP      . aspirin EC tablet 81 mg  81 mg Oral Daily Cobos,  Myer Peer, MD   81 mg at 05/02/19 0826  . atorvastatin (LIPITOR) tablet 20 mg  20 mg Oral q1800 Cobos, Myer Peer, MD   20 mg at 05/01/19 1744  . busPIRone (BUSPAR) tablet 7.5 mg  7.5 mg Oral BID Sharma Covert, MD   7.5 mg at 05/02/19 0826  . donepezil (ARICEPT) tablet 10 mg  10 mg Oral QHS Sharma Covert, MD   10 mg at 05/01/19 2232  . FLUoxetine (PROZAC) capsule 30 mg  30 mg Oral Daily Sharma Covert, MD   30 mg at 05/02/19 0826  . fluticasone (FLOVENT HFA) 44 MCG/ACT inhaler 2 puff  2 puff Inhalation BID Sharma Covert, MD      . folic acid (FOLVITE) tablet 1 mg  1 mg Oral Daily Sharma Covert, MD   1 mg at 05/02/19 [id number]  . hydrOXYzine (ATARAX/VISTARIL) tablet 10 mg  10 mg Oral TID PRN Cobos, Myer Peer, MD   10 mg at 05/01/19 2350  . levothyroxine (SYNTHROID) tablet 25 mcg  25 mcg Oral Q0600 Cobos, Myer Peer, MD   25 mcg at 05/02/19 0826  . magnesium hydroxide (MILK OF MAGNESIA) suspension 30 mL  30 mL Oral Daily PRN Deloria Lair, NP      . memantine (NAMENDA) tablet 5 mg  5 mg Oral Daily Sharma Covert, MD   5 mg at 05/02/19 0826  . thiamine tablet 100 mg  100 mg Oral Daily Sharma Covert, MD   100 mg at 05/02/19 [id number]  . traZODone (DESYREL) tablet 50 mg  50 mg Oral QHS PRN Deloria Lair, NP   50 mg at 05/01/19 2232  . Vitamin D (Ergocalciferol) (DRISDOL) capsule 50,000 Units  50,000 Units Oral Q7 days Sharma Covert, MD   50,000 Units at 05/01/19 1400   Lab Results:  Results for orders  placed or performed during the hospital encounter of 04/24/19 (from the past 48 hour(s))  Glucose, capillary     Status: Abnormal   Collection Time: 05/01/19  5:55 AM  Result Value Ref Range   Glucose-Capillary 100 (H) 70 - 99 mg/dL  Glucose, capillary     Status: None   Collection Time: 05/02/19  6:02 AM  Result Value Ref Range   Glucose-Capillary 96 70 - 99 mg/dL   Blood Alcohol level:  No results found for: [facility]  Metabolic Disorder Labs: Lab Results   Component Value Date   HGBA1C 5.9 (H) 04/25/2019   MPG 122.63 04/25/2019   No results found for: PROLACTIN Lab Results  Component Value Date   CHOL 174 04/25/2019   TRIG 119 04/25/2019   HDL 57 04/25/2019   CHOLHDL 3.1 04/25/2019   VLDL 24 04/25/2019   LDLCALC 93 04/25/2019   LDLCALC 92 05/07/2018   Physical Findings: AIMS: Facial and Oral Movements Muscles of Facial Expression: None, normal Lips and Perioral Area: None, normal Jaw: None, normal Tongue: None, normal,Extremity Movements Upper (arms, wrists, hands, fingers): None, normal Lower (legs, knees, ankles, toes): None, normal, Trunk Movements Neck, shoulders, hips: None, normal, Overall Severity Severity of abnormal movements (highest score from questions above): None, normal Incapacitation due to abnormal movements: None, normal Patient's awareness of abnormal movements (rate only patient's report): No Awareness, Dental Status Current problems with teeth and/or dentures?: No Does patient usually wear dentures?: No  CIWA:    COWS:     Musculoskeletal: Strength & Muscle Tone: within normal limits Gait & Station: normal Patient leans: N/A  Psychiatric Specialty Exam: Physical Exam  Nursing note and vitals reviewed. Constitutional: She is oriented to person, place, and time. She appears well-developed and well-nourished.  HENT:  Head: Normocephalic and atraumatic.  Cardiovascular: Normal rate.  Respiratory: Effort normal.  Genitourinary:    Genitourinary Comments: Deferred   Musculoskeletal:        General: Normal range of motion.     Cervical back: Normal range of motion.  Neurological: She is alert and oriented to person, place, and time.  Skin: Skin is warm.    Review of Systems  Constitutional: Negative for chills, diaphoresis and fever.  HENT: Negative for congestion, rhinorrhea, sneezing and sore throat.   Respiratory: Negative for cough, shortness of breath and wheezing.   Cardiovascular: Negative  for chest pain and palpitations.  Genitourinary: Negative for difficulty urinating.  Skin: Negative for color change.  Allergic/Immunologic: Negative for environmental allergies and food allergies.  Neurological: Negative for dizziness and headaches.  Psychiatric/Behavioral: Positive for confusion, decreased concentration, dysphoric mood and hallucinations. Negative for agitation, behavioral problems, self-injury, sleep disturbance and suicidal ideas. The patient is nervous/anxious. The patient is not hyperactive.     Blood pressure 135/66, pulse 70, temperature 98.1 F (36.7 C), temperature source Oral, resp. rate 18, height 5\' 5"  (1.651 m), weight 84.4 kg, SpO2 96 %.Body mass index is 30.95 kg/m.  General Appearance: Casual  Eye Contact:  Fair  Speech:  Normal Rate  Volume:  Normal  Mood:  Anxious and Depressed  Affect:  Tearful  Thought Process:  Coherent and Descriptions of Associations: Circumstantial  Orientation:  Full (Time, Place, and Person)  Thought Content:  Rumination  Suicidal Thoughts:  No  Homicidal Thoughts:  No  Memory:  Immediate;   Fair Recent;   Fair Remote;   Fair  Judgement:  Impaired  Insight:  Lacking  Psychomotor Activity:  Increased  Concentration:  Concentration:  Fair and Attention Span: Fair  Recall:  [employer] of Knowledge:  Poor  Language:  Good  Akathisia:  Negative  Handed:  Right  AIMS (if indicated):     Assets:  Desire for Improvement Resilience  ADL's:  Intact  Cognition:  Impaired,  Moderate  Sleep:  Number of Hours: 8.75   Treatment Plan Summary: Daily contact with patient to assess and evaluate symptoms and progress in treatment, Medication management and Plan : Patient is seen and examined.  Patient is a 74 year old female with the above-stated past psychiatric history who is seen in follow-up.   -Continue inpatient hospitalization.  -Will continue today 05/02/2019 plan as below except where it is noted.  Diagnosis: #1  unspecified depression versus major depression, #2 generalized anxiety disorder,  #3 unspecified dementia,  #4 hypothyroidism,  #5 type 2 diabetes well controlled,  #6 history of hypertension,  #7 hyperlipidemia.  (Previous documentation). Patient is seen in follow-up.  Review of the electronic medical record shows that she has had dementia symptoms at least since early 2020.  It may have been earlier than that.  I am going to increase her fluoxetine to 30 mg p.o. daily with a goal of increasing it up to 40 within a couple of days.  I am also going to add buspirone for anxiety.  Clearly medications like hydroxyzine and Benadryl can lead to worsening cognitive status, but right now she is only on 10 mg p.o. 3 times daily as needed anxiety.  We will not change that for now.  We will start BuSpar at 5 mg p.o. twice daily and titrate that.  She has been on the donepezil 5 mg at bedtime since 04/27/2019.  We will wait another day or 2 before increasing that to 10 mg a day to avoid GI side effects.  Her TSH was normal on admission, and she has been prescribed 25 mcg p.o. daily, but I am a bit concerned that because of her memory issues what her compliance was at home.  TSH will have to be followed up.  She is also on an aspirin a day for coronary artery disease protection.  She is also on Lipitor for hyperlipidemia.  As stated above her CT and MRI of the brain from 2020 showed mild to moderate atrophy which would be consistent with an Alzheimer's type dementia.  She apparently has a history of hypertension and was treated with lisinopril, but that has been held since she was in the hospital secondary to being normotensive.  Placement will be an issue, and have asked social work to contact her son who lives in [place] for assistance.  According to the patient he is adopted and he has not had much contact with her since the death of her husband/his father.  It is unclear whether that is true or not given her memory  issues.  We will continue the medication changes as listed above and attempt placement of some shape or form.  1.  Continue coated aspirin 81 mg p.o. daily for heart health. 2.  Continue Lipitor 20 mg p.o. every afternoon for hyperlipidemia. 3.  Continue Aricept 5 mg p.o. nightly for cognitive issues. 4.  Continue fluoxetine to 30 mg p.o. daily for anxiety and depression. 5.  Continue hydroxyzine 10 mg p.o. 3 times daily as needed anxiety. 6.  Continue buspirone 5 mg p.o. twice daily for anxiety and titrate. 7.  Continue levothyroxine 25 mcg p.o. daily for hypothyroidism. 8.  Continue trazodone 50 mg  p.o. nightly as needed insomnia. 9.  Initiated Abilify 2 mg po Q hs for racing thoughts & as an adjunct to an anti-depressant. 10. Obtain Ekg on Monday 05-05-19 after initiation of Abilify to monitor if any cardiac changes since starting Abilify.  11. Disposition planning-in progress.  Lindell Spar, NP, PMHNP, FNP_BC 05/02/2019, 12:50 PMPatient ID: Kara Huynh, female   DOB: [birth year], 74 y.o.   MRN: [id number] Patient ID: Kara Huynh, female   DOB: [birth year], 74 y.o.   MRN: [id number]

## 2019-05-02 NOTE — Progress Notes (Signed)
Recreation Therapy Notes  Date:  2.12.21 Time: 0930 Location: 300 Hall Group Room  Group Topic: Stress Management  Goal Area(s) Addresses:  Patient will identify positive stress management techniques. Patient will identify benefits of using stress management post d/c.  Intervention: Stress Management  Activity : Meditation.  LRT played a meditation that focused on choosing to make positive choices.  Patients were to listen and follow along as meditation played.  Education:  Stress Management, Discharge Planning.   Education Outcome: Acknowledges Education  Clinical Observations/Feedback: Pt did not attend activity.    Victorino Sparrow, LRT/CTRS         Ria Comment, Gioia Ranes A 05/02/2019 11:05 AM

## 2019-05-02 NOTE — NC FL2 (Signed)
[place] LEVEL OF CARE SCREENING TOOL     IDENTIFICATION  Patient Name: Kara Huynh Birthdate: 12-Oct-1945 Sex: female Admission Date (Current Location): 04/24/2019  [facility] and [place] Number:  Herbalist and Address:  [facility], [place] [address] [place], [place] [address]      Provider Number: [id number]  Attending Physician Name and Address:  Sharma Covert, MD  Relative Name and Phone Number:       Current Level of Care: Hospital Recommended Level of Care: [place], Memory Care Prior Approval Number:    Date Approved/Denied:   PASRR Number:    Discharge Plan: Other (Comment)(ALF or Memory Care)    Current Diagnoses: Patient Active Problem List   Diagnosis Date Noted  . Suicidal ideation 05/01/2019  . MDD (major depressive disorder) 04/24/2019  . Mixed hyperlipidemia 02/05/2018  . Endometrial cancer ([place]) 02/11/2013  . Postmenopausal bleeding 11/27/2012  . Essential hypertension 07/04/2012  . Diabetes type 2, controlled ([place]) 07/04/2012  . Depression 07/04/2012  . Unspecified hypothyroidism 07/04/2012  . Unspecified vitamin D deficiency 07/04/2012  . Osteoarthrosis, unspecified whether generalized or localized, involving lower leg 07/04/2012    Orientation RESPIRATION BLADDER Height & Weight     Self, Situation, Place  Normal Continent Weight: 84.4 kg Height:  5\' 5"  (165.1 cm)  BEHAVIORAL SYMPTOMS/MOOD NEUROLOGICAL BOWEL NUTRITION STATUS      Continent Diet(Regular)  AMBULATORY STATUS COMMUNICATION OF NEEDS Skin   Independent Verbally Normal                       Personal Care Assistance Level of Assistance  Bathing, Dressing, Feeding, Total care Bathing Assistance: Independent Feeding assistance: Independent Dressing Assistance: Independent Total Care Assistance: Independent   Functional Limitations Info             SPECIAL CARE FACTORS FREQUENCY                        Contractures Contractures Info: Not present    Additional Factors Info  Code Status, Allergies Code Status Info: FULL Allergies Info: No known allergies           Current Medications (05/02/2019):  This is the current hospital active medication list Current Facility-Administered Medications  Medication Dose Route Frequency Provider Last Rate Last Admin  . acetaminophen (TYLENOL) tablet 650 mg  650 mg Oral Q6H PRN Dixon, Rashaun M, NP      . albuterol (VENTOLIN HFA) 108 (90 Base) MCG/ACT inhaler 1-2 puff  1-2 puff Inhalation QID Sharma Covert, MD   2 puff at 05/01/19 1545  . aspirin EC tablet 81 mg  81 mg Oral Daily Cobos, Myer Peer, MD   81 mg at 05/02/19 0826  . atorvastatin (LIPITOR) tablet 20 mg  20 mg Oral q1800 Cobos, Myer Peer, MD   20 mg at 05/01/19 1744  . busPIRone (BUSPAR) tablet 7.5 mg  7.5 mg Oral BID Sharma Covert, MD   7.5 mg at 05/02/19 0826  . donepezil (ARICEPT) tablet 10 mg  10 mg Oral QHS Sharma Covert, MD   10 mg at 05/01/19 2232  . FLUoxetine (PROZAC) capsule 30 mg  30 mg Oral Daily Sharma Covert, MD   30 mg at 05/02/19 0826  . fluticasone (FLOVENT HFA) 44 MCG/ACT inhaler 2 puff  2 puff Inhalation BID Sharma Covert, MD      . folic acid (  FOLVITE) tablet 1 mg  1 mg Oral Daily Sharma Covert, MD   1 mg at 05/02/19 [id number]  . hydrOXYzine (ATARAX/VISTARIL) tablet 10 mg  10 mg Oral TID PRN Cobos, Myer Peer, MD   10 mg at 05/01/19 2350  . levothyroxine (SYNTHROID) tablet 25 mcg  25 mcg Oral Q0600 Cobos, Myer Peer, MD   25 mcg at 05/02/19 0826  . magnesium hydroxide (MILK OF MAGNESIA) suspension 30 mL  30 mL Oral Daily PRN Deloria Lair, NP      . memantine (NAMENDA) tablet 5 mg  5 mg Oral Daily Sharma Covert, MD   5 mg at 05/02/19 0826  . thiamine tablet 100 mg  100 mg Oral Daily Sharma Covert, MD   100 mg at 05/02/19 [id number]  . traZODone (DESYREL) tablet 50 mg  50 mg Oral QHS PRN Deloria Lair, NP   50 mg at 05/01/19 2232   . Vitamin D (Ergocalciferol) (DRISDOL) capsule 50,000 Units  50,000 Units Oral Q7 days Sharma Covert, MD   50,000 Units at 05/01/19 1400     Discharge Medications: Please see discharge summary for a list of discharge medications.  Relevant Imaging Results:  Relevant Lab Results:   Additional Information SSN: [id number]  [name], [place]

## 2019-05-02 NOTE — B[facility] Counselor (Signed)
Patient's PASRR is still under review. CSW following for disposition.  Stephanie Acre, MSW, [place] Social Worker [facility]  [phone]

## 2019-05-02 NOTE — Tx Team (Signed)
Interdisciplinary Treatment and Diagnostic Plan Update  05/02/2019 Time of Session: 8:50am Kara Huynh MRN: [id number]  Principal Diagnosis: MDD (major depressive disorder)  Secondary Diagnoses: Principal Problem:   MDD (major depressive disorder) Active Problems:   Suicidal ideation   Current Medications:  Current Facility-Administered Medications  Medication Dose Route Frequency Provider Last Rate Last Admin  . acetaminophen (TYLENOL) tablet 650 mg  650 mg Oral Q6H PRN Dixon, Rashaun M, NP      . albuterol (VENTOLIN HFA) 108 (90 Base) MCG/ACT inhaler 1-2 puff  1-2 puff Inhalation QID Sharma Covert, MD   2 puff at 05/01/19 1545  . aspirin EC tablet 81 mg  81 mg Oral Daily Cobos, Myer Peer, MD   81 mg at 05/02/19 0826  . atorvastatin (LIPITOR) tablet 20 mg  20 mg Oral q1800 Cobos, Myer Peer, MD   20 mg at 05/01/19 1744  . busPIRone (BUSPAR) tablet 7.5 mg  7.5 mg Oral BID Sharma Covert, MD   7.5 mg at 05/02/19 0826  . donepezil (ARICEPT) tablet 10 mg  10 mg Oral QHS Sharma Covert, MD   10 mg at 05/01/19 2232  . FLUoxetine (PROZAC) capsule 30 mg  30 mg Oral Daily Sharma Covert, MD   30 mg at 05/02/19 0826  . fluticasone (FLOVENT HFA) 44 MCG/ACT inhaler 2 puff  2 puff Inhalation BID Sharma Covert, MD      . folic acid (FOLVITE) tablet 1 mg  1 mg Oral Daily Sharma Covert, MD   1 mg at 05/02/19 [id number]  . hydrOXYzine (ATARAX/VISTARIL) tablet 10 mg  10 mg Oral TID PRN Cobos, Myer Peer, MD   10 mg at 05/01/19 2350  . levothyroxine (SYNTHROID) tablet 25 mcg  25 mcg Oral Q0600 Cobos, Myer Peer, MD   25 mcg at 05/02/19 0826  . magnesium hydroxide (MILK OF MAGNESIA) suspension 30 mL  30 mL Oral Daily PRN Deloria Lair, NP      . memantine (NAMENDA) tablet 5 mg  5 mg Oral Daily Sharma Covert, MD   5 mg at 05/02/19 0826  . thiamine tablet 100 mg  100 mg Oral Daily Sharma Covert, MD   100 mg at 05/02/19 [id number]  . traZODone (DESYREL) tablet 50 mg  50 mg  Oral QHS PRN Deloria Lair, NP   50 mg at 05/01/19 2232  . Vitamin D (Ergocalciferol) (DRISDOL) capsule 50,000 Units  50,000 Units Oral Q7 days Sharma Covert, MD   50,000 Units at 05/01/19 1400   PTA Medications: Medications Prior to Admission  Medication Sig Dispense Refill Last Dose  . aspirin EC 81 MG tablet Take 81 mg by mouth daily.     Marland Kitchen atorvastatin (LIPITOR) 20 MG tablet TAKE 1 TABLET BY MOUTH EVERY DAY 90 tablet 3   . FLUoxetine (PROZAC) 40 MG capsule TAKE 1 CAPSULE BY MOUTH EVERY DAY 90 capsule 1   . levothyroxine (SYNTHROID) 25 MCG tablet TAKE 1 TABLET EVERY MORNING AS DIRECTED 90 tablet 3   . lisinopril (PRINIVIL,ZESTRIL) 5 MG tablet Take 1 tablet (5 mg total) by mouth daily. 90 tablet 3     Patient Stressors:    Patient Strengths:    Treatment Modalities: Medication Management, Group therapy, Case management,  1 to 1 session with clinician, Psychoeducation, Recreational therapy.   Physician Treatment Plan for Primary Diagnosis: MDD (major depressive disorder) Long Term Goal(s): Improvement in symptoms so as ready for discharge Improvement in symptoms so  as ready for discharge   Short Term Goals: Ability to identify changes in lifestyle to reduce recurrence of condition will improve Ability to verbalize feelings will improve Ability to disclose and discuss suicidal ideas Ability to demonstrate self-control will improve Ability to identify and develop effective coping behaviors will improve Ability to maintain clinical measurements within normal limits will improve Ability to identify changes in lifestyle to reduce recurrence of condition will improve Ability to maintain clinical measurements within normal limits will improve  Medication Management: Evaluate patient's response, side effects, and tolerance of medication regimen.  Therapeutic Interventions: 1 to 1 sessions, Unit Group sessions and Medication administration.  Evaluation of Outcomes:  Progressing  Physician Treatment Plan for Secondary Diagnosis: Principal Problem:   MDD (major depressive disorder) Active Problems:   Suicidal ideation  Long Term Goal(s): Improvement in symptoms so as ready for discharge Improvement in symptoms so as ready for discharge   Short Term Goals: Ability to identify changes in lifestyle to reduce recurrence of condition will improve Ability to verbalize feelings will improve Ability to disclose and discuss suicidal ideas Ability to demonstrate self-control will improve Ability to identify and develop effective coping behaviors will improve Ability to maintain clinical measurements within normal limits will improve Ability to identify changes in lifestyle to reduce recurrence of condition will improve Ability to maintain clinical measurements within normal limits will improve     Medication Management: Evaluate patient's response, side effects, and tolerance of medication regimen.  Therapeutic Interventions: 1 to 1 sessions, Unit Group sessions and Medication administration.  Evaluation of Outcomes: Progressing   RN Treatment Plan for Primary Diagnosis: MDD (major depressive disorder) Long Term Goal(s): Knowledge of disease and therapeutic regimen to maintain health will improve  Short Term Goals: Ability to verbalize feelings will improve, Ability to disclose and discuss suicidal ideas, Ability to identify and develop effective coping behaviors will improve and Compliance with prescribed medications will improve  Medication Management: RN will administer medications as ordered by provider, will assess and evaluate patient's response and provide education to patient for prescribed medication. RN will report any adverse and/or side effects to prescribing provider.  Therapeutic Interventions: 1 on 1 counseling sessions, Psychoeducation, Medication administration, Evaluate responses to treatment, Monitor vital signs and CBGs as ordered,  Perform/monitor CIWA, COWS, AIMS and Fall Risk screenings as ordered, Perform wound care treatments as ordered.  Evaluation of Outcomes: Progressing   LCSW Treatment Plan for Primary Diagnosis: MDD (major depressive disorder) Long Term Goal(s): Safe transition to appropriate next level of care at discharge, Engage patient in therapeutic group addressing interpersonal concerns.  Short Term Goals: Engage patient in aftercare planning with referrals and resources  Therapeutic Interventions: Assess for all discharge needs, 1 to 1 time with Social worker, Explore available resources and support systems, Assess for adequacy in community support network, Educate family and significant other(s) on suicide prevention, Complete Psychosocial Assessment, Interpersonal group therapy.  Evaluation of Outcomes: Progressing   Progress in Treatment: Attending groups: Yes. Participating in groups: Yes. Taking medication as prescribed: Yes. Toleration medication: Yes. Family/Significant other contact made: Yes, individual(s) contacted:  son, Dian Situ. Patient understands diagnosis: Yes. Discussing patient identified problems/goals with staff: Yes. Medical problems stabilized or resolved: Yes. Denies suicidal/homicidal ideation: No. Passive SI Issues/concerns per patient self-inventory: No. Other:   New problem(s) identified: None   New Short Term/Long Term Goal(s):Detox, medication stabilization, elimination of SI thoughts, development of comprehensive mental wellness plan.    Patient Goals:  "To have some peace and to  be kinder to myself"   Discharge Plan or Barriers: CSW team is working to place patient in an ALF or memory care facility to facilitate a safe discharge plan.   Reason for Continuation of Hospitalization: Anxiety Depression Medication stabilization Suicidal ideation Other; describe Safe discharge plan.  Estimated Length of Stay: TBD   Attendees: Patient: Kara Huynh  05/02/2019  9:25 AM  Physician: Dr. Neita Garnet, MD Dr.Clary 05/02/2019 9:25 AM  Nursing:  05/02/2019 9:25 AM  RN Care Manager: 05/02/2019 9:25 AM  Social Worker: Radonna Ricker, [place], [place] 05/02/2019 9:25 AM  Recreational Therapist:  05/02/2019 9:25 AM  Other:  05/02/2019 9:25 AM  Other:  05/02/2019 9:25 AM  Other: 05/02/2019 9:25 AM    Scribe for Treatment Team: [name], LCSWA 05/02/2019 9:25 AM

## 2019-05-02 NOTE — Progress Notes (Signed)
   05/02/19 2000  Psych Admission Type (Psych Patients Only)  Admission Status Voluntary  Psychosocial Assessment  Patient Complaints Anxiety  Eye Contact Fair  Facial Expression Animated;Anxious;Pensive  Affect Anxious;Preoccupied  Speech Logical/coherent  Interaction Assertive  Motor Activity Slow  Appearance/Hygiene In hospital gown  Behavior Characteristics Cooperative  Mood Anxious  Thought Process  Coherency WDL  Content WDL  Delusions None reported or observed  Perception WDL  Hallucination None reported or observed  Judgment Poor  Confusion Mild  Danger to Self  Current suicidal ideation? Denies  Danger to Others  Danger to Others None reported or observed

## 2019-05-03 LAB — GLUCOSE, CAPILLARY: Glucose-Capillary: 113 mg/dL — ABNORMAL HIGH (ref 70–99)

## 2019-05-03 MED ORDER — GUAIFENESIN 100 MG/5ML PO SOLN
15.0000 mL | ORAL | Status: DC | PRN
  Administered 2019-05-04: 11:00:00 300 mg via ORAL
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
## 2019-05-03 NOTE — Progress Notes (Signed)
D:  Pt reports that she feels better since her admission to [facility], although her anxiety and depression persist.  Anxiety level rated at 5 out 10 (10 being the highest), and Depression 3 out of 10.  She continues to have cognitive issues and needs reassurance to calm down.  A:  Emotional support and reassurance provided.  Medications administered per MD/NP orders.  Safety checks q 15 min remain in place.  R:  Pt is able to be redirected when she becomes anxious.  Interacting with other members on the unit.  Remains safe on the unit.  RN will continue to monitor and provide support as needed.

## 2019-05-03 NOTE — Progress Notes (Signed)
[facility] Group Notes:  (Nursing/MHT/Case Management/Adjunct)  Date:  05/03/2019  Time:  2030  Type of Therapy:  wrap up group  Participation Level:  Active  Participation Quality:  Appropriate, Attentive, Sharing and Supportive  Affect:  Appropriate  Cognitive:  Alert  Insight:  Improving  Engagement in Group:  Engaged  Modes of Intervention:  Clarification, Education and Support  Summary of Progress/Problems: Pt reported having a good day but not being able to be specific because of her memory deficits. Pt was enjoying socializing with peer pts right before group. Pt plans on being more positive and more active after discharge. Pt reports that she has been reminded she is useful and likes helping others. Pt is grateful she is disease free.   Shellia Cleverly 05/03/2019, 9:29 PM

## 2019-05-03 NOTE — Progress Notes (Signed)
[facility] MD Progress Note  05/03/2019 2:04 PM Kara Huynh  MRN:  [id number]  Subjective: Kara Huynh reports, "I'm doing better. It is just that when I get discharged, I will be going to the same house, same situation, all alone in this world".   Patient is a 74 year old female with a probable past psychiatric history significant for major depression, generalized anxiety disorder, probable dementia of unspecified origin (most likely Alzheimer's) who was admitted on 04/24/2019 secondary to suicidal ideation.  Objective: Patient is seen, chart reviewed. The chart findings discussed with the treatment. She is alert & oriented x 3 today & yet remains significantly forgetful.  Previous review of the electronic medical record revealed that she has had memory issues since at least 06/2018.  She presented to the emergency department at that time confused.  Her husband had been dead for several years, however, reports indicated that patient was still telling neighbors that he was still alive.  She had CT scan of the brain as well as an MRI of the brain done, and it showed mild to moderate atrophy.  She apparently has been on multiple antidepressants in the past.  She does not recall which ones she has been on in the past.  The attending psychiatrist had discussed contacting patient's son and letting him know that she is in the hospital.  She declined this.  She stated that she had adopted him 3 days after he was born.  She stated that he had only called her "twice in the last 3 years". The attending psychiatrist had discussed with her the potential that perhaps she might not remember when her son called her because of her memory issues.  Review of her laboratories revealed a negative reversible dementia work-up.  She does not have an anemia, and her MCV was normal suggesting normal folic acid as well as thiamine.  Her folic acid was measured, and was 10.  Her RPR was negative.  Her TSH was normal at 1.296.  Urinalysis was  negative.  Her EKG showed a normal sinus rhythm with a normal QTC. A new Ekg has been ordered to be done on 05-05-19 to check for any cardiac changes from her baseline as she is being started on Abilify 2 mg tonight for racing thoughts & to augment her antidepressant. It looks like the last time she saw anyone for follow-up directly was in February 2020.  She was being treated with fluoxetine 40 mg a day at that time and 25 mcg p.o. daily of levothyroxine.  She also has a history of type 2 diabetes, hypertension and hyperlipidemia.  Psychotherapy was recommended at that time, but she had declined it.  Her vital signs are stable, she is afebrile.  She slept 5.75 hours last night. Patient is visible on the unit, attending group sessions. She is being encouraged to come out of her room & continue to attend group sessions. Kara Huynh does not appear to be responding to any internal stimuli. Will continue her current plan of care as already in progress. She denies any side effects from her medications.  Principal Problem: MDD (major depressive disorder)  Diagnosis: Principal Problem:   MDD (major depressive disorder) Active Problems:   Suicidal ideation  Total Time spent with patient: 15 minutes  Past Psychiatric History: See admission H&P  Past Medical History:  Past Medical History:  Diagnosis Date  . Anxiety   . Arthritis   . Cancer ([place])    uterine  . Depression   . Endometrial  polyp 02-17-13   11'14 Dx. endometrial cancer  . Hyperlipidemia   . Hypertension   . Hypothyroidism     Past Surgical History:  Procedure Laterality Date  . APPENDECTOMY    . BREAST SURGERY     biopsy  . CATARACT EXTRACTION, BILATERAL Bilateral 02-17-13   bilateral  . DILITATION & CURRETTAGE/HYSTROSCOPY WITH VERSAPOINT RESECTION N/A 01/31/2013   Procedure: DILATATION & CURETTAGE/HYSTEROSCOPY WITH CERVICAL BLOCK;  Surgeon: Marylynn Pearson, MD;  Location: [facility];  Service: Gynecology;  Laterality: N/A;  . LAPAROSCOPY  FOR ECTOPIC PREGNANCY  1977   SALPINGOSTOMY (SIDE UNKNOWN)  . LAPAROTOMY Bilateral 02/18/2013   Procedure: EXPLORATORY LAPAROTOMY TOTAL ABDMONIAL HYSTERECTOMY BILATERAL SALPINGO OOPHORECTOMY ;  Surgeon: Alvino Chapel, MD;  Location: [facility];  Service: Gynecology;  Laterality: Bilateral;  . TOTAL ABDOMINAL HYSTERECTOMY W/ BILATERAL SALPINGOOPHORECTOMY    . TOTAL KNEE ARTHROPLASTY Bilateral    Family History:  Family History  Problem Relation Age of Onset  . Arthritis Mother   . Mental illness Mother   . Diabetes Mother   . Cancer Father        colon, lung, and prostate  . Arthritis Father   . Hyperlipidemia Father   . Stroke Father   . Heart disease Father   . Hypertension Father   . Colitis Father   . Esophageal cancer Neg Hx   . Rectal cancer Neg Hx   . Stomach cancer Neg Hx   . Breast cancer Neg Hx    Family Psychiatric  History: See admission H&P  Social History:  Social History   Substance and Sexual Activity  Alcohol Use Yes   Comment: twice monthly; had her 1st glass of wine in 2 year      Social History   Substance and Sexual Activity  Drug Use No    Social History   Socioeconomic History  . Marital status: Widowed    Spouse name: Not on file  . Number of children: Not on file  . Years of education: Not on file  . Highest education level: Not on file  Occupational History  . Not on file  Tobacco Use  . Smoking status: Former Smoker    Packs/day: 1.50    Years: 47.00    Pack years: 70.50    Quit date: 07/04/2009    Years since quitting: 9.8  . Smokeless tobacco: Never Used  . Tobacco comment: quit a long time ago  Substance and Sexual Activity  . Alcohol use: Yes    Comment: twice monthly; had her 1st glass of wine in 2 year   . Drug use: No  . Sexual activity: Yes    Partners: Male    Birth control/protection: Post-menopausal  Other Topics Concern  . Not on file  Social History Narrative   Retired from being an Manufacturing systems engineer   Married for 35 years   3 step children and 1 adopted child. They live all over the [name]   Social Determinants of Health   Financial Resource Strain:   . Difficulty of Paying Living Expenses: Not on file  Food Insecurity:   . Worried About Charity fundraiser in the Last Year: Not on file  . Ran Out of Food in the Last Year: Not on file  Transportation Needs:   . Lack of Transportation (Medical): Not on file  . Lack of Transportation (Non-Medical): Not on file  Physical Activity:   . Days of Exercise per Week: Not on file  .  Minutes of Exercise per Session: Not on file  Stress:   . Feeling of Stress : Not on file  Social Connections:   . Frequency of Communication with Friends and Family: Not on file  . Frequency of Social Gatherings with Friends and Family: Not on file  . Attends Religious Services: Not on file  . Active Member of Clubs or Organizations: Not on file  . Attends Archivist Meetings: Not on file  . Marital Status: Not on file   Additional Social History:  Pain Medications: see MAR Prescriptions: see MAR Over the Counter: see MAR History of alcohol / drug use?: No history of alcohol / drug abuse  Sleep: Good  Appetite:  Fair  Current Medications: Current Facility-Administered Medications  Medication Dose Route Frequency Provider Last Rate Last Admin  . acetaminophen (TYLENOL) tablet 650 mg  650 mg Oral Q6H PRN Dixon, Rashaun M, NP      . albuterol (VENTOLIN HFA) 108 (90 Base) MCG/ACT inhaler 1-2 puff  1-2 puff Inhalation QID Sharma Covert, MD   2 puff at 05/03/19 1147  . ARIPiprazole (ABILIFY) tablet 2 mg  2 mg Oral QHS Armarion Greek I, NP   2 mg at 05/02/19 2100  . aspirin EC tablet 81 mg  81 mg Oral Daily Cobos, Myer Peer, MD   81 mg at 05/03/19 0823  . atorvastatin (LIPITOR) tablet 20 mg  20 mg Oral q1800 Cobos, Myer Peer, MD   20 mg at 05/02/19 1757  . busPIRone (BUSPAR) tablet 7.5 mg  7.5 mg Oral BID Sharma Covert, MD   7.5  mg at 05/03/19 [id number]  . donepezil (ARICEPT) tablet 10 mg  10 mg Oral QHS Sharma Covert, MD   10 mg at 05/02/19 2100  . FLUoxetine (PROZAC) capsule 30 mg  30 mg Oral Daily Sharma Covert, MD   30 mg at 05/03/19 [id number]  . fluticasone (FLOVENT HFA) 44 MCG/ACT inhaler 2 puff  2 puff Inhalation BID Sharma Covert, MD   2 puff at 05/03/19 [phone]  . folic acid (FOLVITE) tablet 1 mg  1 mg Oral Daily Sharma Covert, MD   1 mg at 05/03/19 [id number]  . guaiFENesin (ROBITUSSIN) 100 MG/5ML solution 300 mg  15 mL Oral Q4H PRN Demaria Deeney I, NP      . hydrOXYzine (ATARAX/VISTARIL) tablet 10 mg  10 mg Oral TID PRN Cobos, Myer Peer, MD   10 mg at 05/01/19 2350  . levothyroxine (SYNTHROID) tablet 25 mcg  25 mcg Oral Q0600 Cobos, Myer Peer, MD   25 mcg at 05/02/19 0826  . magnesium hydroxide (MILK OF MAGNESIA) suspension 30 mL  30 mL Oral Daily PRN Deloria Lair, NP      . memantine (NAMENDA) tablet 5 mg  5 mg Oral Daily Sharma Covert, MD   5 mg at 05/03/19 [id number]  . thiamine tablet 100 mg  100 mg Oral Daily Sharma Covert, MD   100 mg at 05/03/19 [id number]  . traZODone (DESYREL) tablet 50 mg  50 mg Oral QHS PRN Deloria Lair, NP   50 mg at 05/02/19 2100  . Vitamin D (Ergocalciferol) (DRISDOL) capsule 50,000 Units  50,000 Units Oral Q7 days Sharma Covert, MD   50,000 Units at 05/01/19 1400   Lab Results:  Results for orders placed or performed during the hospital encounter of 04/24/19 (from the past 48 hour(s))  Glucose, capillary     Status: None   Collection  Time: 05/02/19  6:02 AM  Result Value Ref Range   Glucose-Capillary 96 70 - 99 mg/dL  Glucose, capillary     Status: Abnormal   Collection Time: 05/03/19  5:22 AM  Result Value Ref Range   Glucose-Capillary 113 (H) 70 - 99 mg/dL   Comment 1 Notify RN    Blood Alcohol level:  No results found for: [facility]  Metabolic Disorder Labs: Lab Results  Component Value Date   HGBA1C 5.9 (H) 04/25/2019   MPG 122.63 04/25/2019   No  results found for: PROLACTIN Lab Results  Component Value Date   CHOL 174 04/25/2019   TRIG 119 04/25/2019   HDL 57 04/25/2019   CHOLHDL 3.1 04/25/2019   VLDL 24 04/25/2019   LDLCALC 93 04/25/2019   LDLCALC 92 05/07/2018   Physical Findings: AIMS: Facial and Oral Movements Muscles of Facial Expression: None, normal Lips and Perioral Area: None, normal Jaw: None, normal Tongue: None, normal,Extremity Movements Upper (arms, wrists, hands, fingers): None, normal Lower (legs, knees, ankles, toes): None, normal, Trunk Movements Neck, shoulders, hips: None, normal, Overall Severity Severity of abnormal movements (highest score from questions above): None, normal Incapacitation due to abnormal movements: None, normal Patient's awareness of abnormal movements (rate only patient's report): No Awareness, Dental Status Current problems with teeth and/or dentures?: No Does patient usually wear dentures?: No  CIWA:    COWS:     Musculoskeletal: Strength & Muscle Tone: within normal limits Gait & Station: normal Patient leans: N/A  Psychiatric Specialty Exam: Physical Exam  Nursing note and vitals reviewed. Constitutional: She is oriented to person, place, and time. She appears well-developed and well-nourished.  HENT:  Head: Normocephalic and atraumatic.  Cardiovascular: Normal rate.  Respiratory: Effort normal.  Genitourinary:    Genitourinary Comments: Deferred   Musculoskeletal:        General: Normal range of motion.     Cervical back: Normal range of motion.  Neurological: She is alert and oriented to person, place, and time.  Skin: Skin is warm.    Review of Systems  Constitutional: Negative for chills, diaphoresis and fever.  HENT: Negative for congestion, rhinorrhea, sneezing and sore throat.   Respiratory: Negative for cough, shortness of breath and wheezing.   Cardiovascular: Negative for chest pain and palpitations.  Genitourinary: Negative for difficulty  urinating.  Skin: Negative for color change.  Allergic/Immunologic: Negative for environmental allergies and food allergies.  Neurological: Negative for dizziness and headaches.  Psychiatric/Behavioral: Positive for confusion, decreased concentration, dysphoric mood and hallucinations. Negative for agitation, behavioral problems, self-injury, sleep disturbance and suicidal ideas. The patient is nervous/anxious. The patient is not hyperactive.     Blood pressure (!) 137/54, pulse 81, temperature 98.1 F (36.7 C), temperature source Oral, resp. rate 18, height 5\' 5"  (1.651 m), weight 84.4 kg, SpO2 96 %.Body mass index is 30.95 kg/m.  General Appearance: Casual  Eye Contact:  Fair  Speech:  Normal Rate  Volume:  Normal  Mood:  "I see feel bad,   Affect:  Appropriate, some improvement noted.  Thought Process:  Coherent and Descriptions of Associations: Circumstantial  Orientation:  Full (Time, Place, and Person)  Thought Content:  Rumination  Suicidal Thoughts:  No  Homicidal Thoughts:  No  Memory:  Immediate;   Fair Recent;   Fair Remote;   Fair  Judgement:  Impaired  Insight:  Lacking  Psychomotor Activity:  Increased  Concentration:  Concentration: Fair and Attention Span: Fair  Recall:  [employer] of Knowledge:  Poor  Language:  Good  Akathisia:  Negative  Handed:  Right  AIMS (if indicated):     Assets:  Desire for Improvement Resilience  ADL's:  Intact  Cognition:  Impaired,  Moderate  Sleep:  Number of Hours: 5.75   Treatment Plan Summary: Daily contact with patient to assess and evaluate symptoms and progress in treatment, Medication management and Plan : Patient is seen and examined.  Patient is a 74 year old female with the above-stated past psychiatric history who is seen in follow-up.   -Continue inpatient hospitalization.  -Will continue today 05/03/2019 plan as below except where it is noted.  Diagnosis: #1 unspecified depression versus major depression, #2  generalized anxiety disorder,  #3 unspecified dementia,  #4 hypothyroidism,  #5 type 2 diabetes well controlled,  #6 history of hypertension,  #7 hyperlipidemia.  (Previous documentation). Patient is seen in follow-up.  Review of the electronic medical record shows that she has had dementia symptoms at least since early 2020.  It may have been earlier than that.  I am going to increase her fluoxetine to 30 mg p.o. daily with a goal of increasing it up to 40 within a couple of days.  I am also going to add buspirone for anxiety.  Clearly medications like hydroxyzine and Benadryl can lead to worsening cognitive status, but right now she is only on 10 mg p.o. 3 times daily as needed anxiety.  We will not change that for now.  We will start BuSpar at 5 mg p.o. twice daily and titrate that.  She has been on the donepezil 5 mg at bedtime since 04/27/2019.  We will wait another day or 2 before increasing that to 10 mg a day to avoid GI side effects.  Her TSH was normal on admission, and she has been prescribed 25 mcg p.o. daily, but I am a bit concerned that because of her memory issues what her compliance was at home.  TSH will have to be followed up.  She is also on an aspirin a day for coronary artery disease protection.  She is also on Lipitor for hyperlipidemia.  As stated above her CT and MRI of the brain from 2020 showed mild to moderate atrophy which would be consistent with an Alzheimer's type dementia.  She apparently has a history of hypertension and was treated with lisinopril, but that has been held since she was in the hospital secondary to being normotensive.  Placement will be an issue, and have asked social work to contact her son who lives in [place] for assistance.  According to the patient he is adopted and he has not had much contact with her since the death of her husband/his father.  It is unclear whether that is true or not given her memory issues.  We will continue the medication changes as  listed above and attempt placement of some shape or form.  1.  Continue coated aspirin 81 mg p.o. daily for heart health. 2.  Continue Lipitor 20 mg p.o. every afternoon for hyperlipidemia. 3.  Continue Aricept 5 mg p.o. nightly for cognitive issues. 4.  Continue fluoxetine to 30 mg p.o. daily for anxiety and depression. 5.  Continue hydroxyzine 10 mg p.o. 3 times daily as needed anxiety. 6.  Continue buspirone 5 mg p.o. twice daily for anxiety and titrate. 7.  Continue levothyroxine 25 mcg p.o. daily for hypothyroidism. 8.  Continue trazodone 50 mg p.o. nightly as needed insomnia. 9.  Continue Abilify 2 mg po Q hs  for racing thoughts & as an adjunct to an anti-depressant. 10. Obtain Ekg on Monday 05-05-19 after initiation of Abilify to monitor if any cardiac changes since starting Abilify.  11. Disposition planning-in progress.  Lindell Spar, NP, PMHNP, FNP_BC 05/03/2019, 2:04 PMPatient ID: Kara Huynh, female   DOB: 1945/10/05, 74 y.o.   MRN: [id number] Patient ID: Kara Huynh, female   DOB: [birth year], 74 y.o.   MRN: [id number] Patient ID: Kara Huynh, female   DOB: 1946-03-05, 74 y.o.   MRN: [id number]

## 2019-05-03 NOTE — [facility] Group Notes (Signed)
Adult Psychoeducational Group Note  Date:  05/03/2019 Time:  3:20 PM  Group Topic/Focus:  Goals Group:   The focus of this group is to help patients establish daily goals to achieve during treatment and discuss how the patient can incorporate goal setting into their daily lives to aide in recovery.  Participation Level:  Active  Participation Quality:  Appropriate  Affect:  Appropriate  Cognitive:  Appropriate  Insight: Improving  Engagement in Group:  Engaged  Modes of Intervention:  Discussion  Additional Comments:  Pt  Continues to report that she is having problems remembering.  Paulino Rily 05/03/2019, 3:20 PM

## 2019-05-03 NOTE — [facility] Group Notes (Signed)
Adult Psychoeducational Group Note  Date:  05/03/2019 Time:  3:21 PM  Group Topic/Focus:  Identifying Needs:   The focus of this group is to help patients identify their personal needs that have been historically problematic and identify healthy behaviors to address their needs.  Participation Level:  Active  Participation Quality:  Appropriate  Affect:  Appropriate  Cognitive:  Appropriate  Insight: Improving  Engagement in Group:  Engaged  Modes of Intervention:  Activity and Discussion  Additional Comments:  Partisipated fully in the group  Paulino Rily 05/03/2019, 3:21 PM

## 2019-05-04 LAB — GLUCOSE, CAPILLARY: Glucose-Capillary: 103 mg/dL — ABNORMAL HIGH (ref 70–99)

## 2019-05-04 NOTE — [facility] Group Notes (Signed)
[facility] LCSW Group Therapy Note  05/04/2019  10:00-11:00AM  Type of Therapy and Topic:  Group Therapy:  A Hero Worthy of Support  Participation Level:  Active   Description of Group:  Patients in this group were introduced to the concept that additional supports including self-support are an essential part of recovery.  Matching needs with supports to help fulfill those needs was explained.  Establishing boundaries that can gradually be increased or decreased was described, with patients giving their own examples of establishing appropriate boundaries in their lives.  A song entitled "My Own Hero" was played and a group discussion ensued in which patients stated it inspired them to help themselves in order to succeed, because other people cannot achieve their goals such as sobriety or stability for them.  A song was played called "I Am Enough" which led to a discussion about being willing to believe we are worth the effort of being a self-support.   Therapeutic Goals: 1)  demonstrate the importance of being a key part of one's own support system 2)  discuss various available supports 3)  encourage patient to use music as part of their self-support and focus on goals 4)  elicit ideas from patients about supports that need to be added   Summary of Patient Progress:  The patient expressed that she has had some best friends in her life that were healthy supports, as was her husband who was her best friend.  She recently spoke to her old friends and was pleasantly surprised to talk with them again.  She stated her mother always told her that she should not admire herself, that it was wrong to think highly of oneself.  Her mother who had a mental illness was "jealous because my father loved me."  The patient was called out to see a provider for some time, then when she returned, she could not quell a cough so she left the group again and stayed out.  Therapeutic Modalities:   Motivational  Interviewing Activity  [name]

## 2019-05-04 NOTE — Progress Notes (Signed)
[facility] MD Progress Note  05/04/2019 12:59 PM Kara Huynh  MRN:  [id number]  Subjective: [name] reports, "I'm doing better. My worry now is that my memory is getting really bad. I just can't remember much".   Patient is a 74 year old female with a probable past psychiatric history significant for major depression, generalized anxiety disorder, probable dementia of unspecified origin (most likely Alzheimer's) who was admitted on 04/24/2019 secondary to suicidal ideation.  Objective: Patient is seen, chart reviewed. The chart findings discussed with the treatment. She is alert & oriented x 3 today & yet remains significantly forgetful. She did admit to her memory getting really bad & it is worrying her. However, she is out of her room, visible on the unit, attending group sessions. She is actively participating in the group sessions. She does not appear to be in any apparent distress at this time. Per previous notes: Previous review of the electronic medical record revealed that she has had memory issues since at least 06/2018.  She presented to the emergency department at that time confused.  Her husband had been dead for several years, however, reports indicated that patient was still telling neighbors that he was still alive.  She had CT scan of the brain as well as an MRI of the brain done, and it showed mild to moderate atrophy.  She apparently has been on multiple antidepressants in the past.  She does not recall which ones she has been on in the past.  The attending psychiatrist had discussed contacting patient's son and letting him know that she is in the hospital.  She declined this.  She stated that she had adopted him 3 days after he was born.  She stated that he had only called her "twice in the last 3 years". The attending psychiatrist had discussed with her the potential that perhaps she might not remember when her son called her because of her memory issues.  Review of her laboratories revealed a  negative reversible dementia work-up.  She does not have an anemia, and her MCV was normal suggesting normal folic acid as well as thiamine.  Her folic acid was measured, and was 10.  Her RPR was negative.  Her TSH was normal at 1.296.  Urinalysis was negative.  Her EKG showed a normal sinus rhythm with a normal QTC. A new Ekg has been ordered to be done on 05-05-19 to check for any cardiac changes from her baseline as she is being started on Abilify 2 mg tonight for racing thoughts & to augment her antidepressant. It looks like the last time she saw anyone for follow-up directly was in February 2020.  She was being treated with fluoxetine 40 mg a day at that time and 25 mcg p.o. daily of levothyroxine.  She also has a history of type 2 diabetes, hypertension and hyperlipidemia.  Psychotherapy was recommended at that time, but she had declined it.  Her vital signs are stable, she is afebrile.  She slept 6.5 hours last night. She is being encouraged to continue to come out of her room & continue to attend group sessions. [name] does not appear to be responding to any internal stimuli. Will continue her current plan of care as already in progress. She denies any side effects from her medications.  Principal Problem: MDD (major depressive disorder)  Diagnosis: Principal Problem:   MDD (major depressive disorder) Active Problems:   Suicidal ideation  Total Time spent with patient: 15 minutes  Past Psychiatric History:  See admission H&P  Past Medical History:  Past Medical History:  Diagnosis Date  . Anxiety   . Arthritis   . Cancer ([place])    uterine  . Depression   . Endometrial polyp 02-17-13   11'14 Dx. endometrial cancer  . Hyperlipidemia   . Hypertension   . Hypothyroidism     Past Surgical History:  Procedure Laterality Date  . APPENDECTOMY    . BREAST SURGERY     biopsy  . CATARACT EXTRACTION, BILATERAL Bilateral 02-17-13   bilateral  . DILITATION & CURRETTAGE/HYSTROSCOPY WITH  VERSAPOINT RESECTION N/A 01/31/2013   Procedure: DILATATION & CURETTAGE/HYSTEROSCOPY WITH CERVICAL BLOCK;  Surgeon: Marylynn Pearson, MD;  Location: [facility];  Service: Gynecology;  Laterality: N/A;  . LAPAROSCOPY FOR ECTOPIC PREGNANCY  1977   SALPINGOSTOMY (SIDE UNKNOWN)  . LAPAROTOMY Bilateral 02/18/2013   Procedure: EXPLORATORY LAPAROTOMY TOTAL ABDMONIAL HYSTERECTOMY BILATERAL SALPINGO OOPHORECTOMY ;  Surgeon: Alvino Chapel, MD;  Location: [facility];  Service: Gynecology;  Laterality: Bilateral;  . TOTAL ABDOMINAL HYSTERECTOMY W/ BILATERAL SALPINGOOPHORECTOMY    . TOTAL KNEE ARTHROPLASTY Bilateral    Family History:  Family History  Problem Relation Age of Onset  . Arthritis Mother   . Mental illness Mother   . Diabetes Mother   . Cancer Father        colon, lung, and prostate  . Arthritis Father   . Hyperlipidemia Father   . Stroke Father   . Heart disease Father   . Hypertension Father   . Colitis Father   . Esophageal cancer Neg Hx   . Rectal cancer Neg Hx   . Stomach cancer Neg Hx   . Breast cancer Neg Hx    Family Psychiatric  History: See admission H&P  Social History:  Social History   Substance and Sexual Activity  Alcohol Use Yes   Comment: twice monthly; had her 1st glass of wine in 2 year      Social History   Substance and Sexual Activity  Drug Use No    Social History   Socioeconomic History  . Marital status: Widowed    Spouse name: Not on file  . Number of children: Not on file  . Years of education: Not on file  . Highest education level: Not on file  Occupational History  . Not on file  Tobacco Use  . Smoking status: Former Smoker    Packs/day: 1.50    Years: 47.00    Pack years: 70.50    Quit date: 07/04/2009    Years since quitting: 9.8  . Smokeless tobacco: Never Used  . Tobacco comment: quit a long time ago  Substance and Sexual Activity  . Alcohol use: Yes    Comment: twice monthly; had her 1st glass of wine in 2 year   . Drug  use: No  . Sexual activity: Yes    Partners: Male    Birth control/protection: Post-menopausal  Other Topics Concern  . Not on file  Social History Narrative   Retired from being an Web designer   Married for 35 years   3 step children and 1 adopted child. They live all over the [name]   Social Determinants of Health   Financial Resource Strain:   . Difficulty of Paying Living Expenses: Not on file  Food Insecurity:   . Worried About Charity fundraiser in the Last Year: Not on file  . Ran Out of Food in the Last Year: Not on file  Transportation Needs:   . Film/video editor (Medical): Not on file  . Lack of Transportation (Non-Medical): Not on file  Physical Activity:   . Days of Exercise per Week: Not on file  . Minutes of Exercise per Session: Not on file  Stress:   . Feeling of Stress : Not on file  Social Connections:   . Frequency of Communication with Friends and Family: Not on file  . Frequency of Social Gatherings with Friends and Family: Not on file  . Attends Religious Services: Not on file  . Active Member of Clubs or Organizations: Not on file  . Attends Archivist Meetings: Not on file  . Marital Status: Not on file   Additional Social History:  Pain Medications: see MAR Prescriptions: see MAR Over the Counter: see MAR History of alcohol / drug use?: No history of alcohol / drug abuse  Sleep: Good  Appetite:  Fair  Current Medications: Current Facility-Administered Medications  Medication Dose Route Frequency Provider Last Rate Last Admin  . acetaminophen (TYLENOL) tablet 650 mg  650 mg Oral Q6H PRN Dixon, Rashaun M, NP      . albuterol (VENTOLIN HFA) 108 (90 Base) MCG/ACT inhaler 1-2 puff  1-2 puff Inhalation QID Sharma Covert, MD   2 puff at 05/04/19 1145  . ARIPiprazole (ABILIFY) tablet 2 mg  2 mg Oral QHS Lindell Spar I, NP   2 mg at 05/03/19 2036  . aspirin EC tablet 81 mg  81 mg Oral Daily Cobos, Myer Peer, MD   81 mg  at 05/04/19 0841  . atorvastatin (LIPITOR) tablet 20 mg  20 mg Oral q1800 Cobos, Myer Peer, MD   20 mg at 05/03/19 1748  . busPIRone (BUSPAR) tablet 7.5 mg  7.5 mg Oral BID Sharma Covert, MD   7.5 mg at 05/04/19 0841  . donepezil (ARICEPT) tablet 10 mg  10 mg Oral QHS Sharma Covert, MD   10 mg at 05/03/19 2114  . FLUoxetine (PROZAC) capsule 30 mg  30 mg Oral Daily Sharma Covert, MD   30 mg at 05/04/19 0841  . fluticasone (FLOVENT HFA) 44 MCG/ACT inhaler 2 puff  2 puff Inhalation BID Sharma Covert, MD   2 puff at 05/04/19 [phone]  . folic acid (FOLVITE) tablet 1 mg  1 mg Oral Daily Sharma Covert, MD   1 mg at 05/04/19 0841  . guaiFENesin (ROBITUSSIN) 100 MG/5ML solution 300 mg  15 mL Oral Q4H PRN Lindell Spar I, NP   300 mg at 05/04/19 1046  . hydrOXYzine (ATARAX/VISTARIL) tablet 10 mg  10 mg Oral TID PRN Cobos, Myer Peer, MD   10 mg at 05/03/19 2036  . levothyroxine (SYNTHROID) tablet 25 mcg  25 mcg Oral Q0600 Cobos, Myer Peer, MD   25 mcg at 05/04/19 [id number]  . magnesium hydroxide (MILK OF MAGNESIA) suspension 30 mL  30 mL Oral Daily PRN Deloria Lair, NP      . memantine (NAMENDA) tablet 5 mg  5 mg Oral Daily Sharma Covert, MD   5 mg at 05/04/19 0841  . thiamine tablet 100 mg  100 mg Oral Daily Sharma Covert, MD   100 mg at 05/04/19 0841  . traZODone (DESYREL) tablet 50 mg  50 mg Oral QHS PRN Deloria Lair, NP   50 mg at 05/02/19 2100  . Vitamin D (Ergocalciferol) (DRISDOL) capsule 50,000 Units  50,000 Units Oral Q7 days Sharma Covert, MD  50,000 Units at 05/01/19 1400   Lab Results:  Results for orders placed or performed during the hospital encounter of 04/24/19 (from the past 48 hour(s))  Glucose, capillary     Status: Abnormal   Collection Time: 05/03/19  5:22 AM  Result Value Ref Range   Glucose-Capillary 113 (H) 70 - 99 mg/dL   Comment 1 Notify RN   Glucose, capillary     Status: Abnormal   Collection Time: 05/04/19  5:42 AM  Result Value  Ref Range   Glucose-Capillary 103 (H) 70 - 99 mg/dL   Comment 1 Notify RN    Comment 2 Document in Chart    Blood Alcohol level:  No results found for: [facility]  Metabolic Disorder Labs: Lab Results  Component Value Date   HGBA1C 5.9 (H) 04/25/2019   MPG 122.63 04/25/2019   No results found for: PROLACTIN Lab Results  Component Value Date   CHOL 174 04/25/2019   TRIG 119 04/25/2019   HDL 57 04/25/2019   CHOLHDL 3.1 04/25/2019   VLDL 24 04/25/2019   LDLCALC 93 04/25/2019   LDLCALC 92 05/07/2018   Physical Findings: AIMS: Facial and Oral Movements Muscles of Facial Expression: None, normal Lips and Perioral Area: None, normal Jaw: None, normal Tongue: None, normal,Extremity Movements Upper (arms, wrists, hands, fingers): None, normal Lower (legs, knees, ankles, toes): None, normal, Trunk Movements Neck, shoulders, hips: None, normal, Overall Severity Severity of abnormal movements (highest score from questions above): None, normal Incapacitation due to abnormal movements: None, normal Patient's awareness of abnormal movements (rate only patient's report): No Awareness, Dental Status Current problems with teeth and/or dentures?: No Does patient usually wear dentures?: No  CIWA:    COWS:     Musculoskeletal: Strength & Muscle Tone: within normal limits Gait & Station: normal Patient leans: N/A  Psychiatric Specialty Exam: Physical Exam  Nursing note and vitals reviewed. Constitutional: She is oriented to person, place, and time. She appears well-developed and well-nourished.  HENT:  Head: Normocephalic and atraumatic.  Cardiovascular: Normal rate.  Respiratory: Effort normal.  Genitourinary:    Genitourinary Comments: Deferred   Musculoskeletal:        General: Normal range of motion.     Cervical back: Normal range of motion.  Neurological: She is alert and oriented to person, place, and time.  Skin: Skin is warm.    Review of Systems  Constitutional: Negative  for chills, diaphoresis and fever.  HENT: Negative for congestion, rhinorrhea, sneezing and sore throat.   Respiratory: Negative for cough, shortness of breath and wheezing.   Cardiovascular: Negative for chest pain and palpitations.  Genitourinary: Negative for difficulty urinating.  Skin: Negative for color change.  Allergic/Immunologic: Negative for environmental allergies and food allergies.  Neurological: Negative for dizziness and headaches.  Psychiatric/Behavioral: Positive for confusion, decreased concentration, dysphoric mood and hallucinations. Negative for agitation, behavioral problems, self-injury, sleep disturbance and suicidal ideas. The patient is nervous/anxious. The patient is not hyperactive.     Blood pressure (!) 144/71, pulse 62, temperature 98 F (36.7 C), temperature source Oral, resp. rate 18, height 5\' 5"  (1.651 m), weight 84.4 kg, SpO2 96 %.Body mass index is 30.95 kg/m.  General Appearance: Casual  Eye Contact:  Fair  Speech:  Normal Rate  Volume:  Normal  Mood:  "I'm doing a little better".  Affect:  Appropriate, some improvement noted.  Thought Process:  Coherent and Descriptions of Associations: Circumstantial  Orientation:  Full (Time, Place, and Person)  Thought Content:  Rumination  Suicidal Thoughts:  No  Homicidal Thoughts:  No  Memory:  Immediate;   Fair Recent;   Fair Remote;   Fair  Judgement:  Impaired  Insight:  Lacking  Psychomotor Activity:  Increased  Concentration:  Concentration: Fair and Attention Span: Fair  Recall:  [employer] of Knowledge:  Poor  Language:  Good  Akathisia:  Negative  Handed:  Right  AIMS (if indicated):     Assets:  Desire for Improvement Resilience  ADL's:  Intact  Cognition:  Impaired,  Moderate  Sleep:  Number of Hours: 6.5   Treatment Plan Summary: Daily contact with patient to assess and evaluate symptoms and progress in treatment, Medication management and Plan : Patient is seen and examined.   Patient is a 74 year old female with the above-stated past psychiatric history who is seen in follow-up.   -Continue inpatient hospitalization.  -Will continue today 05/04/2019 plan as below except where it is noted.  Diagnosis: #1 unspecified depression versus major depression, #2 generalized anxiety disorder,  #3 unspecified dementia,  #4 hypothyroidism,  #5 type 2 diabetes well controlled,  #6 history of hypertension,  #7 hyperlipidemia.  (Previous documentation). Patient is seen in follow-up.  Review of the electronic medical record shows that she has had dementia symptoms at least since early 2020.  It may have been earlier than that.  I am going to increase her fluoxetine to 30 mg p.o. daily with a goal of increasing it up to 40 within a couple of days.  I am also going to add buspirone for anxiety.  Clearly medications like hydroxyzine and Benadryl can lead to worsening cognitive status, but right now she is only on 10 mg p.o. 3 times daily as needed anxiety.  We will not change that for now.  We will start BuSpar at 5 mg p.o. twice daily and titrate that.  She has been on the donepezil 5 mg at bedtime since 04/27/2019.  We will wait another day or 2 before increasing that to 10 mg a day to avoid GI side effects.  Her TSH was normal on admission, and she has been prescribed 25 mcg p.o. daily, but I am a bit concerned that because of her memory issues what her compliance was at home.  TSH will have to be followed up.  She is also on an aspirin a day for coronary artery disease protection.  She is also on Lipitor for hyperlipidemia.  As stated above her CT and MRI of the brain from 2020 showed mild to moderate atrophy which would be consistent with an Alzheimer's type dementia.  She apparently has a history of hypertension and was treated with lisinopril, but that has been held since she was in the hospital secondary to being normotensive.  Placement will be an issue, and have asked social work to  contact her son who lives in [place] for assistance.  According to the patient he is adopted and he has not had much contact with her since the death of her husband/his father.  It is unclear whether that is true or not given her memory issues.  We will continue the medication changes as listed above and attempt placement of some shape or form.  1.  Continue coated aspirin 81 mg p.o. daily for heart health. 2.  Continue Lipitor 20 mg p.o. every afternoon for hyperlipidemia. 3.  Continue Aricept 5 mg p.o. nightly for cognitive issues. 4.  Continue fluoxetine to 30 mg p.o. daily for anxiety and depression. 5.  Continue hydroxyzine 10 mg p.o. 3 times daily as needed anxiety. 6.  Continue buspirone 5 mg p.o. twice daily for anxiety and titrate. 7.  Continue levothyroxine 25 mcg p.o. daily for hypothyroidism. 8.  Continue trazodone 50 mg p.o. nightly as needed insomnia. 9.  Continue Abilify 2 mg po Q hs for racing thoughts & as an adjunct to an anti-depressant. 10. Obtain Ekg on Monday 05-05-19 after initiation of Abilify to monitor if any cardiac changes since starting Abilify.  11. Disposition planning-in progress.  Lindell Spar, NP, PMHNP, FNP_BC 05/04/2019, 12:59 PMPatient ID: Lester Kinsman, female   DOB: 04/07/1945, 74 y.o.   MRN: [id number] Patient ID: KELLSEY THEDE, female   DOB: [birth year], 74 y.o.   MRN: [id number] Patient ID: MELISSA GOHDE, female   DOB: [birth year], 74 y.o.   MRN: [id number] Patient ID: SOLMARIE HILGENDORF, female   DOB: 1946-02-16, 74 y.o.   MRN: [id number]

## 2019-05-04 NOTE — Progress Notes (Signed)
   05/03/19 2000  Psychosocial Assessment  Patient Complaints Depression;Anxiety  Eye Contact Fair  Facial Expression Anxious;Animated  Affect Anxious;Depressed  Catering manager Activity Slow  Appearance/Hygiene Unremarkable  Behavior Characteristics Cooperative  Mood Depressed;Pleasant;Anxious  Thought Process  Coherency WDL  Content WDL  Delusions None reported or observed  Perception WDL  Hallucination None reported or observed  Judgment Limited  Confusion None  Danger to Self  Current suicidal ideation? Denies  Danger to Others  Danger to Others None reported or observed

## 2019-05-04 NOTE — Progress Notes (Signed)
Pt c/o feeling hopeless and alone today. She stated she doesn't have any family and her husband died. She expressed several times that she fear she will be left alone when she leaves the hospital. Verbal support provided to the pt. Pt denies SI/HI. Pt continues to expressed feeling confused and is often forgetful. Pt reoriented by staff as needed.      05/04/19 1000  Psych Admission Type (Psych Patients Only)  Admission Status Voluntary  Psychosocial Assessment  Patient Complaints Hopelessness  Eye Contact Fair  Facial Expression Animated  Affect Depressed;Anxious;Inconsistent with thought content;Preoccupied  Speech Logical/coherent  Interaction Assertive  Motor Activity Fidgety  Appearance/Hygiene Unremarkable  Behavior Characteristics Anxious  Mood Depressed;Anxious  Aggressive Behavior  Effect No apparent injury  Thought Process  Coherency Circumstantial  Content Preoccupation  Delusions None reported or observed  Perception WDL  Hallucination None reported or observed  Judgment Limited  Confusion Mild  Danger to Self  Current suicidal ideation? Denies  Danger to Others  Danger to Others None reported or observed

## 2019-05-05 ENCOUNTER — Other Ambulatory Visit: Payer: Self-pay

## 2019-05-05 DIAGNOSIS — F0281 Dementia in other diseases classified elsewhere with behavioral disturbance: Secondary | ICD-10-CM

## 2019-05-05 DIAGNOSIS — G309 Alzheimer's disease, unspecified: Secondary | ICD-10-CM

## 2019-05-05 DIAGNOSIS — F02818 Dementia in other diseases classified elsewhere, unspecified severity, with other behavioral disturbance: Secondary | ICD-10-CM

## 2019-05-05 LAB — GLUCOSE, CAPILLARY: Glucose-Capillary: 111 mg/dL — ABNORMAL HIGH (ref 70–99)

## 2019-05-05 MED ORDER — BUSPIRONE HCL 10 MG PO TABS
10.0000 mg | ORAL_TABLET | Freq: Two times a day (BID) | ORAL | Status: DC
  Administered 2019-05-05 – 2019-05-14 (×18): 10 mg via ORAL
  Filled 2019-05-05 (×22): qty 1

## 2019-05-05 MED ORDER — TUBERCULIN PPD 5 UNIT/0.1ML ID SOLN
5.0000 [IU] | Freq: Once | INTRADERMAL | Status: AC
  Administered 2019-05-05: 5 [IU] via INTRADERMAL

## 2019-05-05 MED ORDER — MEMANTINE HCL 5 MG PO TABS
5.0000 mg | ORAL_TABLET | Freq: Two times a day (BID) | ORAL | Status: DC
  Administered 2019-05-05 – 2019-05-14 (×18): 5 mg via ORAL
  Filled 2019-05-05 (×22): qty 1

## 2019-05-05 NOTE — BHH Counselor (Signed)
CSW received a call from [facility], admissions coordinator at [facility]. [facility] able to accept patient for in their ALF for long term care.   Patient will need a negative TB skin test for final acceptance. A bed is available this week.  Stephanie Acre, MSW, [place] Social Worker [facility]  [phone]

## 2019-05-05 NOTE — Progress Notes (Addendum)
[facility] MD Progress Note  05/05/2019 1:05 PM Kara Huynh  MRN:  [id number] Subjective:  Patient is a 74 year old female with a probable past psychiatric history significant for major depression, generalized anxiety disorder, probable dementia of unspecified origin (most likely Alzheimer's) who was admitted on 04/24/2019 secondary to suicidal ideation.  Objective: Patient is seen and examined.  Patient is a 74 year old female with a past psychiatric history significant for probable Alzheimer's dementia.  She is seen in follow-up.  She states she feels better this morning because she was able to "get things off her chest".  She continues to complain of grief issues with regards to her husband who died 3 years ago.  Because of her memory issues she is unable to remember when people have contacted her after his death, and this includes her son.  She is oriented to self and time today.  She thinks she is in [facility], but is unsure the name of the hospital or the town that we are in.  She denied any suicidal ideation but "I cannot go back to where I was living".  Her thinking along those lines is that she is alone, and everything in the home frightens her.  She stated that her home has 10 rooms, but my understanding was she was in a condominium.  Her vital signs are stable, she is afebrile.  She slept 6.5 hours last night.  Review of her laboratories showed no new laboratories since 2/8.  She denied any auditory or visual hallucinations.  She denied any suicidal or homicidal ideation.  Principal Problem: Alzheimer's dementia with behavioral disturbance ([place]) Diagnosis: Principal Problem:   Alzheimer's dementia with behavioral disturbance ([place]) Active Problems:   MDD (major depressive disorder)   Suicidal ideation  Total Time spent with patient: 20 minutes  Past Psychiatric History: See admission H&P  Past Medical History:  Past Medical History:  Diagnosis Date  . Anxiety   . Arthritis   . Cancer  ([place])    uterine  . Depression   . Endometrial polyp 02-17-13   11'14 Dx. endometrial cancer  . Hyperlipidemia   . Hypertension   . Hypothyroidism     Past Surgical History:  Procedure Laterality Date  . APPENDECTOMY    . BREAST SURGERY     biopsy  . CATARACT EXTRACTION, BILATERAL Bilateral 02-17-13   bilateral  . DILITATION & CURRETTAGE/HYSTROSCOPY WITH VERSAPOINT RESECTION N/A 01/31/2013   Procedure: DILATATION & CURETTAGE/HYSTEROSCOPY WITH CERVICAL BLOCK;  Surgeon: Marylynn Pearson, MD;  Location: [facility];  Service: Gynecology;  Laterality: N/A;  . LAPAROSCOPY FOR ECTOPIC PREGNANCY  1977   SALPINGOSTOMY (SIDE UNKNOWN)  . LAPAROTOMY Bilateral 02/18/2013   Procedure: EXPLORATORY LAPAROTOMY TOTAL ABDMONIAL HYSTERECTOMY BILATERAL SALPINGO OOPHORECTOMY ;  Surgeon: Alvino Chapel, MD;  Location: [facility];  Service: Gynecology;  Laterality: Bilateral;  . TOTAL ABDOMINAL HYSTERECTOMY W/ BILATERAL SALPINGOOPHORECTOMY    . TOTAL KNEE ARTHROPLASTY Bilateral    Family History:  Family History  Problem Relation Age of Onset  . Arthritis Mother   . Mental illness Mother   . Diabetes Mother   . Cancer Father        colon, lung, and prostate  . Arthritis Father   . Hyperlipidemia Father   . Stroke Father   . Heart disease Father   . Hypertension Father   . Colitis Father   . Esophageal cancer Neg Hx   . Rectal cancer Neg Hx   . Stomach cancer Neg Hx   . Breast  cancer Neg Hx    Family Psychiatric  History: See admission H&P Social History:  Social History   Substance and Sexual Activity  Alcohol Use Yes   Comment: twice monthly; had her 1st glass of wine in 2 year      Social History   Substance and Sexual Activity  Drug Use No    Social History   Socioeconomic History  . Marital status: Widowed    Spouse name: Not on file  . Number of children: Not on file  . Years of education: Not on file  . Highest education level: Not on file  Occupational History  . Not on  file  Tobacco Use  . Smoking status: Former Smoker    Packs/day: 1.50    Years: 47.00    Pack years: 70.50    Quit date: 07/04/2009    Years since quitting: 9.8  . Smokeless tobacco: Never Used  . Tobacco comment: quit a long time ago  Substance and Sexual Activity  . Alcohol use: Yes    Comment: twice monthly; had her 1st glass of wine in 2 year   . Drug use: No  . Sexual activity: Yes    Partners: Male    Birth control/protection: Post-menopausal  Other Topics Concern  . Not on file  Social History Narrative   Retired from being an Web designer   Married for 35 years   3 step children and 1 adopted child. They live all over the [name]   Social Determinants of Health   Financial Resource Strain:   . Difficulty of Paying Living Expenses: Not on file  Food Insecurity:   . Worried About Charity fundraiser in the Last Year: Not on file  . Ran Out of Food in the Last Year: Not on file  Transportation Needs:   . Lack of Transportation (Medical): Not on file  . Lack of Transportation (Non-Medical): Not on file  Physical Activity:   . Days of Exercise per Week: Not on file  . Minutes of Exercise per Session: Not on file  Stress:   . Feeling of Stress : Not on file  Social Connections:   . Frequency of Communication with Friends and Family: Not on file  . Frequency of Social Gatherings with Friends and Family: Not on file  . Attends Religious Services: Not on file  . Active Member of Clubs or Organizations: Not on file  . Attends Archivist Meetings: Not on file  . Marital Status: Not on file   Additional Social History:    Pain Medications: see MAR Prescriptions: see MAR Over the Counter: see MAR History of alcohol / drug use?: No history of alcohol / drug abuse                    Sleep: Good  Appetite:  Fair  Current Medications: Current Facility-Administered Medications  Medication Dose Route Frequency Provider Last Rate Last Admin  .  acetaminophen (TYLENOL) tablet 650 mg  650 mg Oral Q6H PRN Dixon, Rashaun M, NP      . albuterol (VENTOLIN HFA) 108 (90 Base) MCG/ACT inhaler 1-2 puff  1-2 puff Inhalation QID Sharma Covert, MD   2 puff at 05/05/19 1236  . ARIPiprazole (ABILIFY) tablet 2 mg  2 mg Oral QHS Lindell Spar I, NP   2 mg at 05/04/19 2055  . aspirin EC tablet 81 mg  81 mg Oral Daily Cobos, Myer Peer, MD   81 mg  at 05/05/19 0850  . atorvastatin (LIPITOR) tablet 20 mg  20 mg Oral q1800 Cobos, Myer Peer, MD   20 mg at 05/04/19 1738  . busPIRone (BUSPAR) tablet 10 mg  10 mg Oral BID Sharma Covert, MD      . donepezil (ARICEPT) tablet 10 mg  10 mg Oral QHS Sharma Covert, MD   10 mg at 05/04/19 2055  . FLUoxetine (PROZAC) capsule 30 mg  30 mg Oral Daily Sharma Covert, MD   30 mg at 05/05/19 0849  . fluticasone (FLOVENT HFA) 44 MCG/ACT inhaler 2 puff  2 puff Inhalation BID Sharma Covert, MD   2 puff at 05/05/19 [phone]  . folic acid (FOLVITE) tablet 1 mg  1 mg Oral Daily Sharma Covert, MD   1 mg at 05/05/19 0850  . guaiFENesin (ROBITUSSIN) 100 MG/5ML solution 300 mg  15 mL Oral Q4H PRN Lindell Spar I, NP   300 mg at 05/04/19 1046  . hydrOXYzine (ATARAX/VISTARIL) tablet 10 mg  10 mg Oral TID PRN Cobos, Myer Peer, MD   10 mg at 05/04/19 1642  . levothyroxine (SYNTHROID) tablet 25 mcg  25 mcg Oral Q0600 Cobos, Myer Peer, MD   25 mcg at 05/05/19 [phone]  . magnesium hydroxide (MILK OF MAGNESIA) suspension 30 mL  30 mL Oral Daily PRN Dixon, Rashaun M, NP      . memantine (NAMENDA) tablet 5 mg  5 mg Oral BID Sharma Covert, MD      . thiamine tablet 100 mg  100 mg Oral Daily Sharma Covert, MD   100 mg at 05/05/19 0850  . traZODone (DESYREL) tablet 50 mg  50 mg Oral QHS PRN Deloria Lair, NP   50 mg at 05/02/19 2100  . Vitamin D (Ergocalciferol) (DRISDOL) capsule 50,000 Units  50,000 Units Oral Q7 days Sharma Covert, MD   50,000 Units at 05/01/19 1400    Lab Results:  Results for orders  placed or performed during the hospital encounter of 04/24/19 (from the past 48 hour(s))  Glucose, capillary     Status: Abnormal   Collection Time: 05/04/19  5:42 AM  Result Value Ref Range   Glucose-Capillary 103 (H) 70 - 99 mg/dL   Comment 1 Notify RN    Comment 2 Document in Chart   Glucose, capillary     Status: Abnormal   Collection Time: 05/05/19  6:07 AM  Result Value Ref Range   Glucose-Capillary 111 (H) 70 - 99 mg/dL   Comment 1 Notify RN    Comment 2 Document in Chart     Blood Alcohol level:  No results found for: [facility]  Metabolic Disorder Labs: Lab Results  Component Value Date   HGBA1C 5.9 (H) 04/25/2019   MPG 122.63 04/25/2019   No results found for: PROLACTIN Lab Results  Component Value Date   CHOL 174 04/25/2019   TRIG 119 04/25/2019   HDL 57 04/25/2019   CHOLHDL 3.1 04/25/2019   VLDL 24 04/25/2019   LDLCALC 93 04/25/2019   LDLCALC 92 05/07/2018    Physical Findings: AIMS: Facial and Oral Movements Muscles of Facial Expression: None, normal Lips and Perioral Area: None, normal Jaw: None, normal Tongue: None, normal,Extremity Movements Upper (arms, wrists, hands, fingers): None, normal Lower (legs, knees, ankles, toes): None, normal, Trunk Movements Neck, shoulders, hips: None, normal, Overall Severity Severity of abnormal movements (highest score from questions above): None, normal Incapacitation due to abnormal movements: None, normal Patient's awareness  of abnormal movements (rate only patient's report): No Awareness, Dental Status Current problems with teeth and/or dentures?: No Does patient usually wear dentures?: No  CIWA:    COWS:     Musculoskeletal: Strength & Muscle Tone: within normal limits Gait & Station: normal Patient leans: N/A  Psychiatric Specialty Exam: Physical Exam  Nursing note and vitals reviewed. Constitutional: She appears well-developed and well-nourished.  HENT:  Head: Normocephalic and atraumatic.  Respiratory:  Effort normal.  Neurological: She is alert.    Review of Systems  Blood pressure 121/61, pulse 77, temperature 97.7 F (36.5 C), resp. rate 18, height 5\' 5"  (1.651 m), weight 84.4 kg, SpO2 100 %.Body mass index is 30.95 kg/m.  General Appearance: Casual  Eye Contact:  Good  Speech:  Normal Rate  Volume:  Normal  Mood:  Anxious  Affect:  Congruent  Thought Process:  Coherent and Descriptions of Associations: Circumstantial  Orientation:  Other:  Person and time  Thought Content:  Rumination  Suicidal Thoughts:  No  Homicidal Thoughts:  No  Memory:  Immediate;   Poor Recent;   Poor Remote;   Poor  Judgement:  Intact  Insight:  Lacking  Psychomotor Activity:  Increased  Concentration:  Concentration: Fair and Attention Span: Fair  Recall:  Poor  Fund of Knowledge:  Fair  Language:  Good  Akathisia:  Negative  Handed:  Right  AIMS (if indicated):     Assets:  Desire for Improvement Resilience  ADL's:  Intact  Cognition:  Impaired,  Moderate  Sleep:  Number of Hours: 6.5     Treatment Plan Summary: Daily contact with patient to assess and evaluate symptoms and progress in treatment, Medication management and Plan : Patient is seen and examined.  Patient is a 74 year old female with the above-stated past psychiatric history who is seen in follow-up.  Diagnosis: #1 dementia with behavioral disturbance thought to be most probably Alzheimer's, #2 unspecified depression versus major depression, #3 generalized anxiety disorder, #4 type 2 diabetes mellitus well controlled, #5 hypothyroidism, #6 hyperlipidemia, #7 essential hypertension  Patient is seen in follow-up.  She is essentially unchanged.  Her anxiety is fairly well controlled, but I will increase her BuSpar to 10 mg p.o. twice daily.  Her Aricept has been increased to 10 mg p.o. nightly, and I will increase the memantine to 5 mg p.o. twice daily.  No other changes in her medicines.  Social work continues to attempt to find  placement in a nursing home, or assisted living facility versus a dementia unit.  1.  Continue Abilify 2 mg p.o. nightly for mood stability as well as augmentation for depression treatment. 2.  Continue coated aspirin 81 mg p.o. daily for heart health. 3.  Continue Lipitor 20 mg p.o. every afternoon for hyperlipidemia. 4.  Increase buspirone to 10 mg p.o. twice daily for anxiety. 5.  Continue donepezil 10 mg p.o. nightly for Alzheimer's dementia. 6.  Continue fluoxetine 30 mg p.o. daily for depression and anxiety. 7.  Continue fluticasone 2 puffs inhaled daily for COPD. 8.  Continue folic acid 1 mg p.o. daily for nutritional supplementation. 9.  Continue Robitussin 15 cc every 4 hours as needed cough. 10.  Continue hydroxyzine 10 mg p.o. 3 times daily as needed anxiety. 11.  Increase memantine to 5 mg p.o. twice daily for Alzheimer's dementia. 12.  Continue thiamine 100 mg p.o. daily for nutritional supplementation. 13.  Continue trazodone 50 mg p.o. nightly as needed insomnia. 14.  Continue levothyroxine 25 mcg  p.o. daily for hypothyroidism. 15.  Continue vitamin D 50,000 international units p.o. q. 7 days for vitamin D deficiency. 16.  Disposition planning-in progress. Sharma Covert, MD 05/05/2019, 1:05 PM

## 2019-05-05 NOTE — Progress Notes (Signed)
D:  Patient's self inventory sheet, patient sleeps good, sleep medication helpful.  Good appetite, low energy level, poor concentration.  Rated depression 6, hopeless 7, anxiety 10.  Denied withdrawals.  Denied SI.  Denied physical problems.  Denied physical pain.  Goal is read.  Plans to talk to MD. A:  Medications administered per MD orders.  Emotional support and encouragement given patient. R:  Denied SI and HI, contracts for safety.  Denied A/V hallucinations.  Safety maintained with 15 minute checks.

## 2019-05-05 NOTE — BHH Group Notes (Signed)
LCSW Group Therapy Notes 05/05/2019 1:47 PM  Type of Therapy and Topic: Group Therapy: Overcoming Obstacles  Participation Level: Active  Description of Group:  In this group patients will be encouraged to explore what they see as obstacles to their own wellness and recovery. They will be guided to discuss their thoughts, feelings, and behaviors related to these obstacles. The group will process together ways to cope with barriers, with attention given to specific choices patients can make. Each patient will be challenged to identify changes they are motivated to make in order to overcome their obstacles. This group will be process-oriented, with patients participating in exploration of their own experiences as well as giving and receiving support and challenge from other group members.  Therapeutic Goals: 1. Patient will identify personal and current obstacles as they relate to admission. 2. Patient will identify barriers that currently interfere with their wellness or overcoming obstacles.  3. Patient will identify feelings, thought process and behaviors related to these barriers. 4. Patient will identify two changes they are willing to make to overcome these obstacles:   Summary of Patient Progress Merie remained present throughout group and offered support and encouragement to her peers. Ambreia shared that depression, grief, and limited family supports are obstacles she struggled with prior to admission.    Therapeutic Modalities:  Cognitive Behavioral Therapy Solution Focused Therapy Motivational Interviewing Relapse Prevention Therapy  Stephanie Acre, MSW, [facility] 05/05/2019 1:47 PM

## 2019-05-05 NOTE — BHH Counselor (Signed)
CSW faxed patient referral information out to ALF's for placement to assist with a safe discharge planning. Patient's PASRR is still under review.  [facility] Pending [facility] P[facility]  [facility] Pending   [facility] P[facility]  [facility]  [facility] Pending  [facility]  [facility]   [facility]  [facility]  [facility] P[facility]  [facility] P[facility]  [facility] Pending  [facility] Pending  [facility] Pending [facility] Pending  [facility] Pending  [facility] Pending  [facility] Pending  [facility] P[facility] Pending   [facility] Pending  [facility] P[facility]  [facility] P[facility] Pending   Stephanie Acre, MSW, [place] Social Worker [facility] Adult Unit  [phone]

## 2019-05-05 NOTE — Progress Notes (Signed)
PATIENT RECEIVED PPD L FOREARM ON 05/05/2019 AT 1646.

## 2019-05-05 NOTE — Progress Notes (Signed)
The patient attended the A.A./Zoom meeting this evening and was appropriate.

## 2019-05-05 NOTE — Progress Notes (Signed)
Recreation Therapy Notes  Date:  2.15.21 Time: 0930 Location: [address] Dayroom  Group Topic: Stress Management  Goal Area(s) Addresses:  Patient will identify positive stress management techniques. Patient will identify benefits of using stress management post d/c.  Intervention: Stress Management  Activity :  Meditation. LRT played a meditation that focused on letting go of the past and focusing on the present.  Patients were to listen and follow along as the meditation played to engage in activity.  Education:  Stress Management, Discharge Planning.   Education Outcome: Acknowledges Education  Clinical Observations/Feedback: Pt did not attend activity.   Victorino Sparrow, LRT/CTRS         Victorino Sparrow A 05/05/2019 11:41 AM

## 2019-05-05 NOTE — Plan of Care (Signed)
Nurse discussed anxiety, depression and coping skills with patient.  

## 2019-05-06 LAB — GLUCOSE, CAPILLARY: Glucose-Capillary: 105 mg/dL — ABNORMAL HIGH (ref 70–99)

## 2019-05-06 MED ORDER — CYANOCOBALAMIN 1000 MCG/ML IJ SOLN
1000.0000 ug | Freq: Once | INTRAMUSCULAR | Status: AC
  Administered 2019-05-06: 1000 ug via INTRAMUSCULAR
  Filled 2019-05-06: qty 1

## 2019-05-06 NOTE — Progress Notes (Signed)
Pt attended spiritual care group on loss and grief facilitated by Chaplain Jerene Pitch, MDiv, BCC     Group goal: Support / education around grief.  Identifying grief patterns, feelings / responses to grief, identifying behaviors that may emerge from grief responses, identifying when one may call on an ally or coping skill.  Group Description:  Following introductions and group rules, group opened with psycho-social ed. Group members engaged in facilitated dialog around topic of loss, with particular support around experiences of loss in their lives. Group Identified types of loss (relationships / self / things) and identified patterns, circumstances, and changes that precipitate losses. Reflected on thoughts / feelings around loss, normalized grief responses, and recognized variety in grief experience.  Provided support in facilitated process group.   Group reflected on Worden's tasks of grief.  Group facilitation drew on narrative, and Adlerian modalities, as well as brief cognitive behavioral     Patient progress:  Kara Huynh was present throughout group.  Engaged in group discussion.  She spent beginning of group attending to others and offering support.  She did not seem to recall speaking with chaplain in group last week.  Shared with group about the death of her spouse, and frustration around loss of contact with her son as well as siblings.  Received support from group around values that she and her spouse held, and how she is re-envisioning these in her life.  She is choosing not to go back to the home they shared, and received support for this choice.

## 2019-05-06 NOTE — Progress Notes (Signed)
[facility] MD Progress Note  05/06/2019 11:43 AM Kara Huynh  MRN:  [id number] Subjective:  Patient reports she is feeling better than she did on admission. She also states she feels her memory " may be a little better". Currently denies SI and presents future oriented . She is expressing interest in going to an ALF after discharge, and states " I don't want to go back to my house", reporting she had been feeling lonely and overwhelmed following death of her husband.   Objective: Patient seen and case reviewed with treatment team. 74 year old widowed female (husband passed away 3 years ago), lives alone, has 1 adult son with whom she has limited contact, on Fish farm manager. Presented for worsening depression, suicidal ideations. With recent thoughts of wanting to traffic or stabbing self, neurovegetative symptoms,social isolation and decreased self-care. She expressed concern regarding memory loss over recent months and states that she recently had an episode where she got lost while going to a supermarket.  Today patient presents with improved mood and affect is reactive and appropriate. She acknowledges mood improvement since admission. Currently denies SI and is future oriented . She describes improving memory, possibly temporally related to Aricept/Namenda management. Today remains alert, attentive, oriented x 3, recall is 3/3 immediate and 1/3 at 3 minutes .  Denies medication side effects. Behavior on unit calm and in good control, pleasant on approach.  Principal Problem: Alzheimer's dementia with behavioral disturbance ([place]) Diagnosis: Principal Problem:   Alzheimer's dementia with behavioral disturbance ([place]) Active Problems:   MDD (major depressive disorder)   Suicidal ideation  Total Time spent with patient: 20 minutes  Past Psychiatric History: See admission H&P  Past Medical History:  Past Medical History:  Diagnosis Date  . Anxiety   . Arthritis   . Cancer ([place])     uterine  . Depression   . Endometrial polyp 02-17-13   11'14 Dx. endometrial cancer  . Hyperlipidemia   . Hypertension   . Hypothyroidism     Past Surgical History:  Procedure Laterality Date  . APPENDECTOMY    . BREAST SURGERY     biopsy  . CATARACT EXTRACTION, BILATERAL Bilateral 02-17-13   bilateral  . DILITATION & CURRETTAGE/HYSTROSCOPY WITH VERSAPOINT RESECTION N/A 01/31/2013   Procedure: DILATATION & CURETTAGE/HYSTEROSCOPY WITH CERVICAL BLOCK;  Surgeon: Marylynn Pearson, MD;  Location: [facility];  Service: Gynecology;  Laterality: N/A;  . LAPAROSCOPY FOR ECTOPIC PREGNANCY  1977   SALPINGOSTOMY (SIDE UNKNOWN)  . LAPAROTOMY Bilateral 02/18/2013   Procedure: EXPLORATORY LAPAROTOMY TOTAL ABDMONIAL HYSTERECTOMY BILATERAL SALPINGO OOPHORECTOMY ;  Surgeon: Alvino Chapel, MD;  Location: [facility];  Service: Gynecology;  Laterality: Bilateral;  . TOTAL ABDOMINAL HYSTERECTOMY W/ BILATERAL SALPINGOOPHORECTOMY    . TOTAL KNEE ARTHROPLASTY Bilateral    Family History:  Family History  Problem Relation Age of Onset  . Arthritis Mother   . Mental illness Mother   . Diabetes Mother   . Cancer Father        colon, lung, and prostate  . Arthritis Father   . Hyperlipidemia Father   . Stroke Father   . Heart disease Father   . Hypertension Father   . Colitis Father   . Esophageal cancer Neg Hx   . Rectal cancer Neg Hx   . Stomach cancer Neg Hx   . Breast cancer Neg Hx    Family Psychiatric  History: See admission H&P Social History:  Social History   Substance and Sexual Activity  Alcohol Use Yes  Comment: twice monthly; had her 1st glass of wine in 2 year      Social History   Substance and Sexual Activity  Drug Use No    Social History   Socioeconomic History  . Marital status: Widowed    Spouse name: Not on file  . Number of children: Not on file  . Years of education: Not on file  . Highest education level: Not on file  Occupational History  . Not on file   Tobacco Use  . Smoking status: Former Smoker    Packs/day: 1.50    Years: 47.00    Pack years: 70.50    Quit date: 07/04/2009    Years since quitting: 9.8  . Smokeless tobacco: Never Used  . Tobacco comment: quit a long time ago  Substance and Sexual Activity  . Alcohol use: Yes    Comment: twice monthly; had her 1st glass of wine in 2 year   . Drug use: No  . Sexual activity: Yes    Partners: Male    Birth control/protection: Post-menopausal  Other Topics Concern  . Not on file  Social History Narrative   Retired from being an Web designer   Married for 35 years   3 step children and 1 adopted child. They live all over the [name]   Social Determinants of Health   Financial Resource Strain:   . Difficulty of Paying Living Expenses: Not on file  Food Insecurity:   . Worried About Charity fundraiser in the Last Year: Not on file  . Ran Out of Food in the Last Year: Not on file  Transportation Needs:   . Lack of Transportation (Medical): Not on file  . Lack of Transportation (Non-Medical): Not on file  Physical Activity:   . Days of Exercise per Week: Not on file  . Minutes of Exercise per Session: Not on file  Stress:   . Feeling of Stress : Not on file  Social Connections:   . Frequency of Communication with Friends and Family: Not on file  . Frequency of Social Gatherings with Friends and Family: Not on file  . Attends Religious Services: Not on file  . Active Member of Clubs or Organizations: Not on file  . Attends Archivist Meetings: Not on file  . Marital Status: Not on file   Additional Social History:    Pain Medications: see MAR Prescriptions: see MAR Over the Counter: see MAR History of alcohol / drug use?: No history of alcohol / drug abuse  Sleep: Good  Appetite:  Good  Current Medications: Current Facility-Administered Medications  Medication Dose Route Frequency Provider Last Rate Last Admin  . acetaminophen (TYLENOL) tablet  650 mg  650 mg Oral Q6H PRN Deloria Lair, NP   650 mg at 05/06/19 0920  . albuterol (VENTOLIN HFA) 108 (90 Base) MCG/ACT inhaler 1-2 puff  1-2 puff Inhalation QID Sharma Covert, MD   2 puff at 05/06/19 [phone]  . ARIPiprazole (ABILIFY) tablet 2 mg  2 mg Oral QHS Lindell Spar I, NP   2 mg at 05/05/19 2137  . aspirin EC tablet 81 mg  81 mg Oral Daily Effie Janoski, Myer Peer, MD   81 mg at 05/06/19 0918  . atorvastatin (LIPITOR) tablet 20 mg  20 mg Oral q1800 Tristin Vandeusen, Myer Peer, MD   20 mg at 05/05/19 1734  . busPIRone (BUSPAR) tablet 10 mg  10 mg Oral BID Sharma Covert, MD   10  mg at 05/06/19 0918  . donepezil (ARICEPT) tablet 10 mg  10 mg Oral QHS Sharma Covert, MD   10 mg at 05/05/19 2137  . FLUoxetine (PROZAC) capsule 30 mg  30 mg Oral Daily Sharma Covert, MD   30 mg at 05/06/19 [id number]  . fluticasone (FLOVENT HFA) 44 MCG/ACT inhaler 2 puff  2 puff Inhalation BID Sharma Covert, MD   2 puff at 05/06/19 [phone]  . folic acid (FOLVITE) tablet 1 mg  1 mg Oral Daily Sharma Covert, MD   1 mg at 05/06/19 [id number]  . guaiFENesin (ROBITUSSIN) 100 MG/5ML solution 300 mg  15 mL Oral Q4H PRN Lindell Spar I, NP   300 mg at 05/04/19 1046  . hydrOXYzine (ATARAX/VISTARIL) tablet 10 mg  10 mg Oral TID PRN Daemion Mcniel, Myer Peer, MD   10 mg at 05/06/19 0300  . levothyroxine (SYNTHROID) tablet 25 mcg  25 mcg Oral Q0600 Dorine Duffey, Myer Peer, MD   25 mcg at 05/06/19 0703  . magnesium hydroxide (MILK OF MAGNESIA) suspension 30 mL  30 mL Oral Daily PRN Deloria Lair, NP      . memantine (NAMENDA) tablet 5 mg  5 mg Oral BID Sharma Covert, MD   5 mg at 05/06/19 [id number]  . thiamine tablet 100 mg  100 mg Oral Daily Sharma Covert, MD   100 mg at 05/06/19 0919  . traZODone (DESYREL) tablet 50 mg  50 mg Oral QHS PRN Deloria Lair, NP   50 mg at 05/06/19 0112  . tuberculin injection 5 Units  5 Units Intradermal Once Sharma Covert, MD   5 Units at 05/05/19 1646  . Vitamin D (Ergocalciferol) (DRISDOL)  capsule 50,000 Units  50,000 Units Oral Q7 days Sharma Covert, MD   50,000 Units at 05/01/19 1400    Lab Results:  Results for orders placed or performed during the hospital encounter of 04/24/19 (from the past 48 hour(s))  Glucose, capillary     Status: Abnormal   Collection Time: 05/05/19  6:07 AM  Result Value Ref Range   Glucose-Capillary 111 (H) 70 - 99 mg/dL   Comment 1 Notify RN    Comment 2 Document in Chart   Glucose, capillary     Status: Abnormal   Collection Time: 05/06/19  5:47 AM  Result Value Ref Range   Glucose-Capillary 105 (H) 70 - 99 mg/dL    Blood Alcohol level:  No results found for: [facility]  Metabolic Disorder Labs: Lab Results  Component Value Date   HGBA1C 5.9 (H) 04/25/2019   MPG 122.63 04/25/2019   No results found for: PROLACTIN Lab Results  Component Value Date   CHOL 174 04/25/2019   TRIG 119 04/25/2019   HDL 57 04/25/2019   CHOLHDL 3.1 04/25/2019   VLDL 24 04/25/2019   LDLCALC 93 04/25/2019   LDLCALC 92 05/07/2018    Physical Findings: AIMS: Facial and Oral Movements Muscles of Facial Expression: None, normal Lips and Perioral Area: None, normal Jaw: None, normal Tongue: None, normal,Extremity Movements Upper (arms, wrists, hands, fingers): None, normal Lower (legs, knees, ankles, toes): None, normal, Trunk Movements Neck, shoulders, hips: None, normal, Overall Severity Severity of abnormal movements (highest score from questions above): None, normal Incapacitation due to abnormal movements: None, normal Patient's awareness of abnormal movements (rate only patient's report): No Awareness, Dental Status Current problems with teeth and/or dentures?: No Does patient usually wear dentures?: No  CIWA:    COWS:  Musculoskeletal: Strength & Muscle Tone: within normal limits Gait & Station: normal Patient leans: N/A  Psychiatric Specialty Exam: Physical Exam  Nursing note and vitals reviewed. Constitutional: She appears  well-developed and well-nourished.  HENT:  Head: Normocephalic and atraumatic.  Respiratory: Effort normal.  Neurological: She is alert.    Review of Systems no vomiting , no fever or chills   Blood pressure (!) 136/56, pulse 76, temperature 98.3 F (36.8 C), temperature source Oral, resp. rate 18, height 5\' 5"  (1.651 m), weight 84.4 kg, SpO2 100 %.Body mass index is 30.95 kg/m.  General Appearance: Casual  Eye Contact:  Good  Speech:  Normal Rate  Volume:  Normal  Mood:  imporoved  Affect:  reactive and full in range   Thought Process:  Goal Directed and Descriptions of Associations: Intact  Orientation:  Other:  alert and currently oriented x 3   Thought Content:  no hallucinations, no delusions , not internally preoccupied   Suicidal Thoughts:  No denies SI  Homicidal Thoughts:  No  Memory:  3/3 immediate, 1/3 at 3 minutes   Judgement:  Fair/ improving   Insight:  Fair  Psychomotor Activity:  Normal  Concentration:  Concentration: Good and Attention Span: Good  Recall:  [place] of Knowledge:  Good  Language:  Good  Akathisia:  Negative  Handed:  Right  AIMS (if indicated):     Assets:  Desire for Improvement Resilience  ADL's:  Intact  Cognition:  Impaired,  Moderate  Sleep:  Number of Hours: 6.5   Assessment -  74 year old widowed female (husband passed away 3 years ago), lives alone, has 1 adult son with whom she has limited contact, on Fish farm manager. Presented for worsening depression, suicidal ideations. With recent thoughts of wanting to traffic or stabbing self, neurovegetative symptoms,social isolation and decreased self-care. She expressed concern regarding memory loss over recent months and states that she recently had an episode where she got lost while going to a supermarket.  Today patient presents alert , attentive and overall mood presents improved with a fuller range of affect. No current SI. She is reporting improving short term memory but it is  still compromised ( 1/3 recall at 3 minutes, did not recognize Probation officer - I had seen her several times last week) . She is interested in going to an ALF, and treatment team ( CSW) is working on this disposition goal. As required for this placement, PPD ordered, scheduled to be read tomorrow. Denies medication side effects. Of note, B12 and Vit D were both low.     Treatment Plan Summary: Daily contact with patient to assess and evaluate symptoms and progress in treatment, Medication management and Plan : Patient is seen and examined.  Patient is a 74 year old female with the above-stated past psychiatric history who is seen in follow-up.  Treatment Plan reviewed as below today 2/16 Encourage group and milieu participation Treatment team working on disposition planning options Continue treatment as below  1.  Continue Abilify 2 mg p.o. nightly for mood stability as well as augmentation for depression treatment. 2.  Continue ASA 81 mg p.o. daily for heart health. 3.  Continue Lipitor 20 mg p.o. every afternoon for hyperlipidemia. 4.  Continue Buspirone to 10 mg p.o. twice daily for anxiety. 5.  Continue Donepezil 10 mg p.o. nightly for Alzheimer's dementia. 6.  Continue Fluoxetine 30 mg p.o. daily for depression and anxiety. 7.  Continue fluticasone 2 puffs inhaled daily for COPD. 8.  Continue Foolic  acid 1 mg p.o. daily for nutritional supplementation. 9.  Continue Robitussin 15 cc every 4 hours as needed cough. 10.  Continue Hydroxyzine 10 mg p.o. 3 times daily as needed anxiety. 11.  Continue Memantine to 5 mg p.o. twice daily for Alzheimer's dementia. 12.  Continue thiamine 100 mg p.o. daily for nutritional supplementation. 13.  Continue Trazodone 50 mg p.o. nightly as needed insomnia. 14.  Continue Levothyroxine 25 mcg p.o. daily for hypothyroidism. 15.  Continue vitamin D 50,000 international units p.o. q. 7 days for vitamin D deficiency. 16. Initiate B12 supplementation for Vit B12  deficiency   Jenne Campus, MD 05/06/2019, 11:43 AM   Patient ID: Kara Huynh, female   DOB: 06/29/1945, 74 y.o.   MRN: [id number]

## 2019-05-06 NOTE — Progress Notes (Signed)
Pt denied having SI/HI/AVH.  Pt Continues to become frustrated that her cognitive abilities are declining and memory is poor.  Pt did not sleep well last night so she spent a large part of the day in bed.  Pt is now up and about interacting with other pt's on the unit.  Pt remains safe at this time.  RN will continue to monitor and provide support as needed.      05/06/19 0930  Psych Admission Type (Psych Patients Only)  Admission Status Voluntary  Psychosocial Assessment  Patient Complaints Anxiety;Confusion  Eye Contact Fair  Facial Expression Animated  Affect Depressed;Anxious;Inconsistent with thought content;Preoccupied  Speech Logical/coherent  Interaction Assertive  Motor Activity Fidgety  Appearance/Hygiene Unremarkable  Behavior Characteristics Cooperative  Mood Anxious  Aggressive Behavior  Effect No apparent injury  Thought Process  Coherency Disorganized  Content Preoccupation  Delusions None reported or observed  Perception WDL  Hallucination None reported or observed  Judgment Limited  Confusion Mild  Danger to Self  Current suicidal ideation? Denies  Danger to Others  Danger to Others None reported or observed

## 2019-05-06 NOTE — Progress Notes (Signed)
   05/06/19 0102  Psych Admission Type (Psych Patients Only)  Admission Status Voluntary  Psychosocial Assessment  Patient Complaints Anxiety  Eye Contact Fair  Facial Expression Animated  Affect Depressed;Anxious;Inconsistent with thought content;Preoccupied  Speech Logical/coherent  Interaction Assertive  Motor Activity Fidgety  Appearance/Hygiene Unremarkable  Behavior Characteristics Cooperative  Mood Anxious  Aggressive Behavior  Effect No apparent injury  Thought Process  Coherency Circumstantial  Content Preoccupation  Delusions None reported or observed  Perception WDL  Hallucination None reported or observed  Judgment Limited  Confusion Mild  Danger to Self  Current suicidal ideation? Denies  Danger to Others  Danger to Others None reported or observed  D: Patient in dayroom playing cards and interacting with peers. Pt reports she still forgetful and can not recollect what was discussed.  A: Medications administered as prescribed. Support and encouragement provided as needed.  R: Patient remains safe on the unit. Will continue to monitor for safety and stability.

## 2019-05-06 NOTE — BHH Counselor (Signed)
CSW uploaded requested documentation for patient's PASRR review through [facility].   [facility] Team contacted Joycelyn Schmid in admissions at [facility]. Blondie confirms she will need TB results (administered yesterday) and PASRR number for acceptance.  Stephanie Acre, MSW, [place] Social Worker [facility]  [phone]

## 2019-05-07 DIAGNOSIS — G309 Alzheimer's disease, unspecified: Principal | ICD-10-CM

## 2019-05-07 LAB — GLUCOSE, CAPILLARY: Glucose-Capillary: 108 mg/dL — ABNORMAL HIGH (ref 70–99)

## 2019-05-07 NOTE — Progress Notes (Signed)
Pt was pleasant on the unit today.Pt's  Memory continues to be poor and it is upsetting when pt can't remember certain things.  Pt is easily redirected when she becomes frustrated.  Pt taking medications without difficulty and her appetite is good.  Pt remains safe on the unit.  RN will continue to monitor and provide support as needed.     05/07/19 0850  Psych Admission Type (Psych Patients Only)  Admission Status Voluntary  Psychosocial Assessment  Patient Complaints Anxiety;Confusion  Eye Contact Fair  Facial Expression Animated  Affect Depressed;Anxious;Inconsistent with thought content;Preoccupied  Speech Logical/coherent  Interaction Assertive  Motor Activity Fidgety  Appearance/Hygiene Unremarkable  Behavior Characteristics Cooperative  Mood Anxious  Aggressive Behavior  Effect No apparent injury  Thought Process  Coherency Disorganized  Content Preoccupation  Delusions None reported or observed  Perception WDL  Hallucination None reported or observed  Judgment Limited  Confusion Mild  Danger to Self  Current suicidal ideation? Denies  Danger to Others  Danger to Others None reported or observed

## 2019-05-07 NOTE — Progress Notes (Signed)
Recreation Therapy Notes  Date:  2.17.21 Time: 0930 Location: 300 Hall Group Room  Group Topic: Stress Management  Goal Area(s) Addresses:  Patient will identify positive stress management techniques. Patient will identify benefits of using stress management post d/c.  Intervention: Stress Management  Activity : Guided Imagery.  LRT read a script that took patients on a journey floating on a cloud to anywhere you want to go.  Patients were to follow along as script was read to engage in activity.  Education:  Stress Management, Discharge Planning.   Education Outcome: Acknowledges Education  Clinical Observations/Feedback: Pt did not attend group activity.    Victorino Sparrow, LRT/CTRS         Victorino Sparrow A 05/07/2019 11:27 AM

## 2019-05-07 NOTE — Progress Notes (Signed)
   05/07/19 1900  Psych Admission Type (Psych Patients Only)  Admission Status Voluntary  Psychosocial Assessment  Patient Complaints Anxiety  Eye Contact Fair  Facial Expression Animated  Affect Depressed;Anxious;Inconsistent with thought content;Preoccupied  Speech Logical/coherent  Interaction Assertive  Motor Activity Fidgety  Appearance/Hygiene Unremarkable  Behavior Characteristics Cooperative  Mood Anxious  Aggressive Behavior  Effect No apparent injury  Thought Process  Coherency Disorganized  Content Preoccupation  Delusions None reported or observed  Perception WDL  Hallucination None reported or observed  Judgment Limited  Confusion Mild  Danger to Self  Current suicidal ideation? Denies  Danger to Others  Danger to Others None reported or observed

## 2019-05-07 NOTE — Progress Notes (Signed)
Brief follow up with pt during rounding.  Faith expresses some confusion and memory lapses.  Does not recall chaplain from group over last two weeks.  She expresses feeling of frustration that her hip is hurting and she does not recall where she received that walker she is currently using.   Chaplain provided brief support on hallway

## 2019-05-07 NOTE — Progress Notes (Signed)
RN observed pt using a walker on the unit.  Pt said that "someone gave me a walker yesterday because my back hurts too much, I don't want to fall."  RN notified MD that pt feels she needs to use a walker for safety.  MD said he would discuss walker issue with pt.

## 2019-05-07 NOTE — Progress Notes (Addendum)
[facility] MD Progress Note  05/07/2019 9:52 AM Kara Huynh  MRN:  [id number] Subjective:  "I'm not great."  Patient is a 74 year old female with a probable past psychiatric history significant for major depression, generalized anxiety disorder, probable dementia of unspecified origin (most likely Alzheimer's) who was admitted on 04/24/2019 secondary to suicidal ideation.  On assessment today, Kara Huynh found ambulating in the hallway. She is mildly irritable and reports that she is tired of being in the hospital. She continues to agree with plan for discharge to ALF, saying "I cannot go back to that home by myself." She continues to present with memory difficulties and needed a reminder/redirection to location of the dayroom this morning. She is able to correctly identify her location, the city, and the year after a significant pause with each question. She is unable to recall how she slept last night. 6.5 hours of sleep recorded overnight. She denies SI/HI/AVH. No agitated or disruptive behaviors on the unit.  Principal Problem: Alzheimer's dementia with behavioral disturbance (Kara Huynh) Diagnosis: Principal Problem:   Alzheimer's dementia with behavioral disturbance (Kara Huynh) Active Problems:   MDD (major depressive disorder)   Suicidal ideation  Total Time spent with patient: 15 minutes  Past Psychiatric History: See admission H&P  Past Medical History:  Past Medical History:  Diagnosis Date   Anxiety    Arthritis    Cancer ([place])    uterine   Depression    Endometrial polyp 02-17-13   11'14 Dx. endometrial cancer   Hyperlipidemia    Hypertension    Hypothyroidism     Past Surgical History:  Procedure Laterality Date   APPENDECTOMY     BREAST SURGERY     biopsy   CATARACT EXTRACTION, BILATERAL Bilateral 02-17-13   bilateral   DILITATION & CURRETTAGE/HYSTROSCOPY WITH VERSAPOINT RESECTION N/A 01/31/2013   Procedure: DILATATION & CURETTAGE/HYSTEROSCOPY WITH CERVICAL BLOCK;  Surgeon:  Marylynn Pearson, MD;  Location: [facility];  Service: Gynecology;  Laterality: N/A;   LAPAROSCOPY FOR ECTOPIC PREGNANCY  1977   SALPINGOSTOMY (SIDE UNKNOWN)   LAPAROTOMY Bilateral 02/18/2013   Procedure: EXPLORATORY LAPAROTOMY TOTAL ABDMONIAL HYSTERECTOMY BILATERAL SALPINGO OOPHORECTOMY ;  Surgeon: Alvino Chapel, MD;  Location: [facility];  Service: Gynecology;  Laterality: Bilateral;   TOTAL ABDOMINAL HYSTERECTOMY W/ BILATERAL SALPINGOOPHORECTOMY     TOTAL KNEE ARTHROPLASTY Bilateral    Family History:  Family History  Problem Relation Age of Onset   Arthritis Mother    Mental illness Mother    Diabetes Mother    Cancer Father        colon, lung, and prostate   Arthritis Father    Hyperlipidemia Father    Stroke Father    Heart disease Father    Hypertension Father    Colitis Father    Esophageal cancer Neg Hx    Rectal cancer Neg Hx    Stomach cancer Neg Hx    Breast cancer Neg Hx    Family Psychiatric  History: See admission H&P Social History:  Social History   Substance and Sexual Activity  Alcohol Use Yes   Comment: twice monthly; had her 1st glass of wine in 2 year      Social History   Substance and Sexual Activity  Drug Use No    Social History   Socioeconomic History   Marital status: Widowed    Spouse name: Not on file   Number of children: Not on file   Years of education: Not on file   Highest education  level: Not on file  Occupational History   Not on file  Tobacco Use   Smoking status: Former Smoker    Packs/day: 1.50    Years: 47.00    Pack years: 70.50    Quit date: 07/04/2009    Years since quitting: 9.8   Smokeless tobacco: Never Used   Tobacco comment: quit a long time ago  Substance and Sexual Activity   Alcohol use: Yes    Comment: twice monthly; had her 1st glass of wine in 2 year    Drug use: No   Sexual activity: Yes    Partners: Male    Birth control/protection: Post-menopausal  Other Topics Concern   Not on file  Social  History Narrative   Retired from being an Web designer   Married for 35 years   3 step children and 1 adopted child. They live all over the [name]   Social Determinants of Health   Financial Resource Strain:    Difficulty of Paying Living Expenses: Not on file  Food Insecurity:    Worried About Charity fundraiser in the Last Year: Not on file   [employer] of Food in the Last Year: Not on file  Transportation Needs:    Lack of Transportation (Medical): Not on file   Lack of Transportation (Non-Medical): Not on file  Physical Activity:    Days of Exercise per Week: Not on file   Minutes of Exercise per Session: Not on file  Stress:    Feeling of Stress : Not on file  Social Connections:    Frequency of Communication with Friends and Family: Not on file   Frequency of Social Gatherings with Friends and Family: Not on file   Attends Religious Services: Not on file   Active Member of Clubs or Organizations: Not on file   Attends Archivist Meetings: Not on file   Marital Status: Not on file   Additional Social History:    Pain Medications: see MAR Prescriptions: see MAR Over the Counter: see MAR History of alcohol / drug use?: No history of alcohol / drug abuse                    Sleep: Good  Appetite:  Fair  Current Medications: Current Facility-Administered Medications  Medication Dose Route Frequency Provider Last Rate Last Admin   acetaminophen (TYLENOL) tablet 650 mg  650 mg Oral Q6H PRN Dixon, Rashaun M, NP   650 mg at 05/06/19 0920   albuterol (VENTOLIN HFA) 108 (90 Base) MCG/ACT inhaler 1-2 puff  1-2 puff Inhalation QID Sharma Covert, MD   2 puff at 05/07/19 0918   ARIPiprazole (ABILIFY) tablet 2 mg  2 mg Oral QHS Nwoko, Agnes I, NP   2 mg at 05/06/19 2217   aspirin EC tablet 81 mg  81 mg Oral Daily Talor Desrosiers, Myer Peer, MD   81 mg at 05/07/19 0917   atorvastatin (LIPITOR) tablet 20 mg  20 mg Oral q1800 Kollins Fenter A, MD   20 mg at  05/06/19 1645   busPIRone (BUSPAR) tablet 10 mg  10 mg Oral BID Sharma Covert, MD   10 mg at 05/07/19 0915   donepezil (ARICEPT) tablet 10 mg  10 mg Oral QHS Sharma Covert, MD   10 mg at 05/06/19 2217   FLUoxetine (PROZAC) capsule 30 mg  30 mg Oral Daily Sharma Covert, MD   30 mg at 05/07/19 0912   fluticasone (  FLOVENT HFA) 44 MCG/ACT inhaler 2 puff  2 puff Inhalation BID Sharma Covert, MD   2 puff at 0000000 [id number]   folic acid (FOLVITE) tablet 1 mg  1 mg Oral Daily Sharma Covert, MD   1 mg at 05/07/19 0916   guaiFENesin (ROBITUSSIN) 100 MG/5ML solution 300 mg  15 mL Oral Q4H PRN Lindell Spar I, NP   300 mg at 05/04/19 1046   hydrOXYzine (ATARAX/VISTARIL) tablet 10 mg  10 mg Oral TID PRN Ferdinando Lodge, Myer Peer, MD   10 mg at 05/06/19 2217   levothyroxine (SYNTHROID) tablet 25 mcg  25 mcg Oral Q0600 Caytlyn Evers, Myer Peer, MD   25 mcg at 05/07/19 0916   magnesium hydroxide (MILK OF MAGNESIA) suspension 30 mL  30 mL Oral Daily PRN Deloria Lair, NP       memantine (NAMENDA) tablet 5 mg  5 mg Oral BID Sharma Covert, MD   5 mg at 05/07/19 [id number]   thiamine tablet 100 mg  100 mg Oral Daily Sharma Covert, MD   100 mg at 05/07/19 [id number]   traZODone (DESYREL) tablet 50 mg  50 mg Oral QHS PRN Deloria Lair, NP   50 mg at 05/06/19 2217   tuberculin injection 5 Units  5 Units Intradermal Once Sharma Covert, MD   5 Units at 05/05/19 1646   Vitamin D (Ergocalciferol) (DRISDOL) capsule 50,000 Units  50,000 Units Oral Q7 days Sharma Covert, MD   50,000 Units at 05/01/19 1400    Lab Results:  Results for orders placed or performed during the hospital encounter of 04/24/19 (from the past 48 hour(s))  Glucose, capillary     Status: Abnormal   Collection Time: 05/06/19  5:47 AM  Result Value Ref Range   Glucose-Capillary 105 (H) 70 - 99 mg/dL  Glucose, capillary     Status: Abnormal   Collection Time: 05/07/19  6:36 AM  Result Value Ref Range   Glucose-Capillary 108 (H) 70  - 99 mg/dL   Comment 1 Notify RN    Comment 2 Document in Chart     Blood Alcohol level:  No results found for: [facility]  Metabolic Disorder Labs: Lab Results  Component Value Date   HGBA1C 5.9 (H) 04/25/2019   MPG 122.63 04/25/2019   No results found for: PROLACTIN Lab Results  Component Value Date   CHOL 174 04/25/2019   TRIG 119 04/25/2019   HDL 57 04/25/2019   CHOLHDL 3.1 04/25/2019   VLDL 24 04/25/2019   LDLCALC 93 04/25/2019   LDLCALC 92 05/07/2018    Physical Findings: AIMS: Facial and Oral Movements Muscles of Facial Expression: None, normal Lips and Perioral Area: None, normal Jaw: None, normal Tongue: None, normal,Extremity Movements Upper (arms, wrists, hands, fingers): None, normal Lower (legs, knees, ankles, toes): None, normal, Trunk Movements Neck, shoulders, hips: None, normal, Overall Severity Severity of abnormal movements (highest score from questions above): None, normal Incapacitation due to abnormal movements: None, normal Patient's awareness of abnormal movements (rate only patient's report): No Awareness, Dental Status Current problems with teeth and/or dentures?: No Does patient usually wear dentures?: No  CIWA:    COWS:     Musculoskeletal: Strength & Muscle Tone: decreased Gait & Station:  ambulates well with walker Patient leans: N/A  Psychiatric Specialty Exam: Physical Exam  Review of Systems  Blood pressure (!) 136/56, pulse 76, temperature 98.3 F (36.8 C), temperature source Oral, resp. rate 18, height 5\' 5"  (1.651 m), weight  84.4 kg, SpO2 100 %.Body mass index is 30.95 kg/m.  General Appearance: Disheveled  Eye Contact:  Fair  Speech:  Normal Rate  Volume:  Normal  Mood:  Irritable  Affect:  Congruent  Thought Process:  Coherent  Orientation:  Full (Time, Place, and Person)  Thought Content:  Logical  Suicidal Thoughts:  No  Homicidal Thoughts:  No  Memory:  Immediate;   Fair Recent;   Poor Remote;   Good  Judgement:   Fair  Insight:  Fair  Psychomotor Activity:  Normal  Concentration:  Concentration: Fair and Attention Span: Fair  Recall:  [employer] of Knowledge:  Fair  Language:  Fair  Akathisia:  No  Handed:  Right  AIMS (if indicated):     Assets:  Communication Skills Desire for Improvement Financial Resources/Insurance Leisure Time  ADL's:  Intact  Cognition:  WNL  Sleep:  Number of Hours: 6.5     Treatment Plan Summary: Daily contact with patient to assess and evaluate symptoms and progress in treatment and Medication management   Continue inpatient hospitalization.  Continue Aricept 10 mg PO QHS for Alzheimer's dementia Continue Namenda 5 mg PO BID For Alzheimer's dementia Continue Prozac 30 mg PO daily for depression/anxiety Continue Abilify 2 mg PO QHS for mood stability Continue Buspar 10 mg PO BID for anxiety Continue Vistaril 10 mg PO TID PRN anxiety Continue ASA 81 mg PO daily for antiplatelet Continue Lipitor 20 mg PO daily for HLD Continue albuterol inhaler 1-2 puffs QID for asthma Continue Flovent inhaler 2 puffs BID for asthma Continue folic acid 1 mg PO daily for supplementation Continue thiamine 100 mg PO daily for supplementation Continue vitamin D 50,000 units PO Q7days for supplementation Continue Synthroid 25 mcg PO daily for hypothyroidism Continue trazodone 50 mg PO QHS PRN insomnia  Patient will participate in the therapeutic group milieu.  Discharge disposition in progress.   Connye Burkitt, NP 05/07/2019, 9:52 AM  Agree with NP Note

## 2019-05-07 NOTE — Tx Team (Signed)
Interdisciplinary Treatment and Diagnostic Plan Update  05/07/2019 Time of Session: 8:50am Kara Huynh MRN: [id number]  Principal Diagnosis: Alzheimer's dementia with behavioral disturbance [facility])  Secondary Diagnoses: Principal Problem:   Alzheimer's dementia with behavioral disturbance ([place]) Active Problems:   MDD (major depressive disorder)   Suicidal ideation   Current Medications:  Current Facility-Administered Medications  Medication Dose Route Frequency Provider Last Rate Last Admin  . acetaminophen (TYLENOL) tablet 650 mg  650 mg Oral Q6H PRN Deloria Lair, NP   650 mg at 05/06/19 0920  . albuterol (VENTOLIN HFA) 108 (90 Base) MCG/ACT inhaler 1-2 puff  1-2 puff Inhalation QID Sharma Covert, MD   2 puff at 05/07/19 [phone]  . ARIPiprazole (ABILIFY) tablet 2 mg  2 mg Oral QHS Lindell Spar I, NP   2 mg at 05/06/19 2217  . aspirin EC tablet 81 mg  81 mg Oral Daily Cobos, Myer Peer, MD   81 mg at 05/07/19 0917  . atorvastatin (LIPITOR) tablet 20 mg  20 mg Oral q1800 Cobos, Myer Peer, MD   20 mg at 05/06/19 1645  . busPIRone (BUSPAR) tablet 10 mg  10 mg Oral BID Sharma Covert, MD   10 mg at 05/07/19 0915  . donepezil (ARICEPT) tablet 10 mg  10 mg Oral QHS Sharma Covert, MD   10 mg at 05/06/19 2217  . FLUoxetine (PROZAC) capsule 30 mg  30 mg Oral Daily Sharma Covert, MD   30 mg at 05/07/19 0912  . fluticasone (FLOVENT HFA) 44 MCG/ACT inhaler 2 puff  2 puff Inhalation BID Sharma Covert, MD   2 puff at 05/07/19 0919  . folic acid (FOLVITE) tablet 1 mg  1 mg Oral Daily Sharma Covert, MD   1 mg at 05/07/19 0916  . guaiFENesin (ROBITUSSIN) 100 MG/5ML solution 300 mg  15 mL Oral Q4H PRN Lindell Spar I, NP   300 mg at 05/04/19 1046  . hydrOXYzine (ATARAX/VISTARIL) tablet 10 mg  10 mg Oral TID PRN Cobos, Myer Peer, MD   10 mg at 05/06/19 2217  . levothyroxine (SYNTHROID) tablet 25 mcg  25 mcg Oral Q0600 Cobos, Myer Peer, MD   25 mcg at 05/07/19 0916  .  magnesium hydroxide (MILK OF MAGNESIA) suspension 30 mL  30 mL Oral Daily PRN Deloria Lair, NP      . memantine (NAMENDA) tablet 5 mg  5 mg Oral BID Sharma Covert, MD   5 mg at 05/07/19 0916  . thiamine tablet 100 mg  100 mg Oral Daily Sharma Covert, MD   100 mg at 05/07/19 [id number]  . traZODone (DESYREL) tablet 50 mg  50 mg Oral QHS PRN Deloria Lair, NP   50 mg at 05/06/19 2217  . tuberculin injection 5 Units  5 Units Intradermal Once Sharma Covert, MD   5 Units at 05/05/19 1646  . Vitamin D (Ergocalciferol) (DRISDOL) capsule 50,000 Units  50,000 Units Oral Q7 days Sharma Covert, MD   50,000 Units at 05/01/19 1400   PTA Medications: Medications Prior to Admission  Medication Sig Dispense Refill Last Dose  . aspirin EC 81 MG tablet Take 81 mg by mouth daily.     Marland Kitchen atorvastatin (LIPITOR) 20 MG tablet TAKE 1 TABLET BY MOUTH EVERY DAY 90 tablet 3   . FLUoxetine (PROZAC) 40 MG capsule TAKE 1 CAPSULE BY MOUTH EVERY DAY 90 capsule 1   . levothyroxine (SYNTHROID) 25 MCG tablet TAKE 1  TABLET EVERY MORNING AS DIRECTED 90 tablet 3   . lisinopril (PRINIVIL,ZESTRIL) 5 MG tablet Take 1 tablet (5 mg total) by mouth daily. 90 tablet 3     Patient Stressors:    Patient Strengths:    Treatment Modalities: Medication Management, Group therapy, Case management,  1 to 1 session with clinician, Psychoeducation, Recreational therapy.   Physician Treatment Plan for Primary Diagnosis: Alzheimer's dementia with behavioral disturbance ([place]) Long Term Goal(s): Improvement in symptoms so as ready for discharge Improvement in symptoms so as ready for discharge   Short Term Goals: Ability to identify changes in lifestyle to reduce recurrence of condition will improve Ability to verbalize feelings will improve Ability to disclose and discuss suicidal ideas Ability to demonstrate self-control will improve Ability to identify and develop effective coping behaviors will improve Ability to  maintain clinical measurements within normal limits will improve Ability to identify changes in lifestyle to reduce recurrence of condition will improve Ability to maintain clinical measurements within normal limits will improve  Medication Management: Evaluate patient's response, side effects, and tolerance of medication regimen.  Therapeutic Interventions: 1 to 1 sessions, Unit Group sessions and Medication administration.  Evaluation of Outcomes: Progressing  Physician Treatment Plan for Secondary Diagnosis: Principal Problem:   Alzheimer's dementia with behavioral disturbance ([place]) Active Problems:   MDD (major depressive disorder)   Suicidal ideation  Long Term Goal(s): Improvement in symptoms so as ready for discharge Improvement in symptoms so as ready for discharge   Short Term Goals: Ability to identify changes in lifestyle to reduce recurrence of condition will improve Ability to verbalize feelings will improve Ability to disclose and discuss suicidal ideas Ability to demonstrate self-control will improve Ability to identify and develop effective coping behaviors will improve Ability to maintain clinical measurements within normal limits will improve Ability to identify changes in lifestyle to reduce recurrence of condition will improve Ability to maintain clinical measurements within normal limits will improve     Medication Management: Evaluate patient's response, side effects, and tolerance of medication regimen.  Therapeutic Interventions: 1 to 1 sessions, Unit Group sessions and Medication administration.  Evaluation of Outcomes: Progressing   RN Treatment Plan for Primary Diagnosis: Alzheimer's dementia with behavioral disturbance ([place]) Long Term Goal(s): Knowledge of disease and therapeutic regimen to maintain health will improve  Short Term Goals: Ability to verbalize feelings will improve, Ability to disclose and discuss suicidal ideas, Ability to identify and  develop effective coping behaviors will improve and Compliance with prescribed medications will improve  Medication Management: RN will administer medications as ordered by provider, will assess and evaluate patient's response and provide education to patient for prescribed medication. RN will report any adverse and/or side effects to prescribing provider.  Therapeutic Interventions: 1 on 1 counseling sessions, Psychoeducation, Medication administration, Evaluate responses to treatment, Monitor vital signs and CBGs as ordered, Perform/monitor CIWA, COWS, AIMS and Fall Risk screenings as ordered, Perform wound care treatments as ordered.  Evaluation of Outcomes: Progressing   LCSW Treatment Plan for Primary Diagnosis: Alzheimer's dementia with behavioral disturbance ([place]) Long Term Goal(s): Safe transition to appropriate next level of care at discharge, Engage patient in therapeutic group addressing interpersonal concerns.  Short Term Goals: Engage patient in aftercare planning with referrals and resources  Therapeutic Interventions: Assess for all discharge needs, 1 to 1 time with Social worker, Explore available resources and support systems, Assess for adequacy in community support network, Educate family and significant other(s) on suicide prevention, Complete Psychosocial Assessment, Interpersonal group therapy.  Evaluation of Outcomes: Progressing   Progress in Treatment: Attending groups: Yes. Participating in groups: Yes. Taking medication as prescribed: Yes. Toleration medication: Yes. Family/Significant other contact made: Yes, individual(s) contacted:  son, Dian Situ. Patient understands diagnosis: Yes. Discussing patient identified problems/goals with staff: Yes. Medical problems stabilized or resolved: Yes. Denies suicidal/homicidal ideation: No. Passive SI Issues/concerns per patient self-inventory: No. Other:   New problem(s) identified: None   New Short Term/Long Term  Goal(s):Detox, medication stabilization, elimination of SI thoughts, development of comprehensive mental wellness plan.    Patient Goals:  "To have some peace and to be kinder to myself"   Discharge Plan or Barriers: CSW team is working to place patient in an ALF or memory care facility to facilitate a safe discharge plan. [facility] has offered patient a bed pending PASRR number and negative TB test.  Reason for Continuation of Hospitalization: Anxiety Depression Other; describe Safe discharge plan.  Estimated Length of Stay: TBD   Attendees: Patient: Kara Huynh  05/07/2019 9:25 AM  Physician: Dr. Neita Garnet, MD Dr.Clary 05/07/2019 9:25 AM  Nursing:  05/07/2019 9:25 AM  RN Care Manager: 05/07/2019 9:25 AM  Social Worker: Radonna Ricker, [place], [place] 05/07/2019 9:25 AM  Recreational Therapist:  05/07/2019 9:25 AM  Other:  05/07/2019 9:25 AM  Other:  05/07/2019 9:25 AM  Other: 05/07/2019 9:25 AM    Scribe for Treatment Team: [name], [place] 05/07/2019 9:25 AM

## 2019-05-07 NOTE — [facility] Group Notes (Signed)
05/07/2019 8:45am Type of Group and Topic: Psychoeducational Group: Discharge Planning  Participation Level: Did Not Attend  Description of Group Discharge planning group reviews patient's anticipated discharge plans and assists patients to anticipate and address any barriers to wellness/recovery in the community. Suicide prevention education is reviewed with patients in group. Therapeutic Goals 1. Patients will state their anticipated discharge plan and mental health aftercare 2. Patients will identify potential barriers to wellness in the community setting 3. Patients will engage in problem solving, solution focused discussion of ways to anticipate and address barriers to wellness/recovery   Summary of Patient Progress Plan for Discharge/Comments:  Invited, chose not to attend.        Radonna Ricker, MSW, [place] Worker [facility]  Phone: [phone] 05/07/2019 1:48 PM

## 2019-05-07 NOTE — [facility] Counselor (Signed)
Patient's PASRR still pending additional review. Requested documentation was submitted 02/16. A PASRR number is required for admission to [facility].  Stephanie Acre, LCSW-A Clinical Social Worker

## 2019-05-07 NOTE — Progress Notes (Signed)
[place] Group Notes:  (Nursing/MHT/Case Management/Adjunct)  Date:  05/07/2019  Time:  2030  Type of Therapy:  wrap up group  Participation Level:  Active  Participation Quality:  Appropriate, Attentive, Sharing and Supportive  Affect:  Appropriate  Cognitive:  Confused  Insight:  Improving  Engagement in Group:  Engaged  Modes of Intervention:  Clarification, Education and Support  Summary of Progress/Problems: Positive thoughts and positive change discussed.   Shellia Cleverly 05/07/2019, 8:59 PM

## 2019-05-07 NOTE — Progress Notes (Signed)
   05/07/19 0100  Psych Admission Type (Psych Patients Only)  Admission Status Voluntary  Psychosocial Assessment  Patient Complaints Anxiety;Confusion  Eye Contact Fair  Facial Expression Animated  Affect Depressed;Anxious;Inconsistent with thought content;Preoccupied  Speech Logical/coherent  Interaction Assertive  Motor Activity Fidgety  Appearance/Hygiene Unremarkable  Behavior Characteristics Cooperative  Mood Anxious  Aggressive Behavior  Effect No apparent injury  Thought Process  Coherency Disorganized  Content Preoccupation  Delusions None reported or observed  Perception WDL  Hallucination None reported or observed  Judgment Limited  Confusion Mild  Danger to Self  Current suicidal ideation? Denies  Danger to Others  Danger to Others None reported or observed

## 2019-05-08 LAB — GLUCOSE, CAPILLARY: Glucose-Capillary: 98 mg/dL (ref 70–99)

## 2019-05-08 MED ORDER — ALBUTEROL SULFATE HFA 108 (90 BASE) MCG/ACT IN AERS
1.0000 | INHALATION_SPRAY | Freq: Four times a day (QID) | RESPIRATORY_TRACT | Status: DC
  Administered 2019-05-08 – 2019-05-14 (×18): 1 via RESPIRATORY_TRACT
  Filled 2019-05-08: qty 6.7

## 2019-05-08 MED ORDER — TRAZODONE HCL 50 MG PO TABS
50.0000 mg | ORAL_TABLET | Freq: Once | ORAL | Status: AC
  Administered 2019-05-08: 50 mg via ORAL
  Filled 2019-05-08 (×2): qty 1

## 2019-05-08 MED ORDER — TRAZODONE HCL 50 MG PO TABS
50.0000 mg | ORAL_TABLET | Freq: Once | ORAL | Status: AC
  Administered 2019-05-08: 23:00:00 50 mg via ORAL
  Filled 2019-05-08 (×2): qty 1

## 2019-05-08 NOTE — Progress Notes (Signed)
   05/08/19 2100  Psych Admission Type (Psych Patients Only)  Admission Status Voluntary  Psychosocial Assessment  Patient Complaints Anxiety;Confusion  Eye Contact Fair  Facial Expression Animated;Pensive  Affect Anxious;Appropriate to circumstance  Speech Logical/coherent  Interaction Assertive  Motor Activity Slow;Unsteady  Appearance/Hygiene Unremarkable  Behavior Characteristics Cooperative  Mood Anxious  Thought Process  Coherency WDL  Content Preoccupation  Delusions None reported or observed  Perception WDL  Hallucination None reported or observed  Judgment Poor  Confusion Mild  Danger to Self  Current suicidal ideation? Denies  Danger to Others  Danger to Others None reported or observed

## 2019-05-08 NOTE — Plan of Care (Signed)
Progress note  D: pt found in bed; compliant with medication administration. Pt states they slept well. Pt seems as if they are remembering things with repetition. Pt denies any complaints except they're back pain they rate at a 2/10. Pt had been provided medication before shift change. Pt continues to use walker but doesn't seem as they are reliant on this as they will frequently walk away without it and be steady. Pt continues to complain about being on "so many medications". Pt denies si/hi/ah/vh and verbally agrees to approach staff if these become apparent or before harming themself/others while at [place].  A: Pt provided support and encouragement. Pt given medication per protocol and standing orders. Q32m safety checks implemented and continued.  R: Pt safe on the unit. Will continue to monitor.  Pt progressing in the following metrics  Problem: Education: Goal: Knowledge of [facility] Education information/materials will improve Outcome: Progressing Goal: Emotional status will improve Outcome: Progressing Goal: Mental status will improve Outcome: Progressing Goal: Verbalization of understanding the information provided will improve Outcome: Progressing

## 2019-05-08 NOTE — Progress Notes (Signed)
[facility] MD Progress Note  05/08/2019 11:09 AM Kara Huynh  MRN:  [id number] Subjective:  Reports she is feeling " all right". Denies SI. Denies medication side effects. Objective : I have discussed case with treatment team and have met with patient. 74 year old widowed female (husband passed away 3 years ago), lives alone, has 1 adult son with whom she has limited contact, on Fish farm manager. Presented for worsening depression, suicidal ideations. With recent thoughts of wanting to traffic or stabbing self, neurovegetative symptoms,social isolation and decreased self-care. She expressed concern regarding memory loss over recent months and states that she recently had an episode where she got lost while going to a supermarket.  Today patient presents alert, attentive,  pleasantly confused . She states she is feeling " all right", and currently denies suicidal or self injurious ideations. She speaks mostly about remote events such as her first marriage at age 73 and memories about her mother and father ( with whom she had a poor relationship) . She is visible in day room, hallway and is pleasant on approach. As per  RN staff she has now been ambulating with a walker. It appears that another patient who used a walker to mobilize left walker behind at discharge and patient took it. Gait appears steady  ( was evaluated by OT on 2/10 and no balance or mobility deficits were noted at the time) . She states the walker helps her with lower back pain, which she in turn attributes to sitting in bed without a back rest at mealtimes.  Currently denies medication side effects. Denies suicidal ideations and is future oriented, expressing ongoing interest in going to an ALF .  *PPD was placed on L arm on 2/15- read negative.   Principal Problem: Alzheimer's dementia with behavioral disturbance ([place]) Diagnosis: Principal Problem:   Alzheimer's dementia with behavioral disturbance ([place]) Active Problems:   MDD  (major depressive disorder)   Suicidal ideation  Total Time spent with patient: 15 minutes  Past Psychiatric History: See admission H&P  Past Medical History:  Past Medical History:  Diagnosis Date  . Anxiety   . Arthritis   . Cancer ([place])    uterine  . Depression   . Endometrial polyp 02-17-13   11'14 Dx. endometrial cancer  . Hyperlipidemia   . Hypertension   . Hypothyroidism     Past Surgical History:  Procedure Laterality Date  . APPENDECTOMY    . BREAST SURGERY     biopsy  . CATARACT EXTRACTION, BILATERAL Bilateral 02-17-13   bilateral  . DILITATION & CURRETTAGE/HYSTROSCOPY WITH VERSAPOINT RESECTION N/A 01/31/2013   Procedure: DILATATION & CURETTAGE/HYSTEROSCOPY WITH CERVICAL BLOCK;  Surgeon: Marylynn Pearson, MD;  Location: [facility];  Service: Gynecology;  Laterality: N/A;  . LAPAROSCOPY FOR ECTOPIC PREGNANCY  1977   SALPINGOSTOMY (SIDE UNKNOWN)  . LAPAROTOMY Bilateral 02/18/2013   Procedure: EXPLORATORY LAPAROTOMY TOTAL ABDMONIAL HYSTERECTOMY BILATERAL SALPINGO OOPHORECTOMY ;  Surgeon: Alvino Chapel, MD;  Location: [facility];  Service: Gynecology;  Laterality: Bilateral;  . TOTAL ABDOMINAL HYSTERECTOMY W/ BILATERAL SALPINGOOPHORECTOMY    . TOTAL KNEE ARTHROPLASTY Bilateral    Family History:  Family History  Problem Relation Age of Onset  . Arthritis Mother   . Mental illness Mother   . Diabetes Mother   . Cancer Father        colon, lung, and prostate  . Arthritis Father   . Hyperlipidemia Father   . Stroke Father   . Heart disease Father   . Hypertension  Father   . Colitis Father   . Esophageal cancer Neg Hx   . Rectal cancer Neg Hx   . Stomach cancer Neg Hx   . Breast cancer Neg Hx    Family Psychiatric  History: See admission H&P Social History:  Social History   Substance and Sexual Activity  Alcohol Use Yes   Comment: twice monthly; had her 1st glass of wine in 2 year      Social History   Substance and Sexual Activity  Drug Use No     Social History   Socioeconomic History  . Marital status: Widowed    Spouse name: Not on file  . Number of children: Not on file  . Years of education: Not on file  . Highest education level: Not on file  Occupational History  . Not on file  Tobacco Use  . Smoking status: Former Smoker    Packs/day: 1.50    Years: 47.00    Pack years: 70.50    Quit date: 07/04/2009    Years since quitting: 9.8  . Smokeless tobacco: Never Used  . Tobacco comment: quit a long time ago  Substance and Sexual Activity  . Alcohol use: Yes    Comment: twice monthly; had her 1st glass of wine in 2 year   . Drug use: No  . Sexual activity: Yes    Partners: Male    Birth control/protection: Post-menopausal  Other Topics Concern  . Not on file  Social History Narrative   Retired from being an Web designer   Married for 35 years   3 step children and 1 adopted child. They live all over the [name]   Social Determinants of Health   Financial Resource Strain:   . Difficulty of Paying Living Expenses: Not on file  Food Insecurity:   . Worried About Charity fundraiser in the Last Year: Not on file  . Ran Out of Food in the Last Year: Not on file  Transportation Needs:   . Lack of Transportation (Medical): Not on file  . Lack of Transportation (Non-Medical): Not on file  Physical Activity:   . Days of Exercise per Week: Not on file  . Minutes of Exercise per Session: Not on file  Stress:   . Feeling of Stress : Not on file  Social Connections:   . Frequency of Communication with Friends and Family: Not on file  . Frequency of Social Gatherings with Friends and Family: Not on file  . Attends Religious Services: Not on file  . Active Member of Clubs or Organizations: Not on file  . Attends Archivist Meetings: Not on file  . Marital Status: Not on file   Additional Social History:    Pain Medications: see MAR Prescriptions: see MAR Over the Counter: see MAR History of  alcohol / drug use?: No history of alcohol / drug abuse  Sleep: Good  Appetite:  Good  Current Medications: Current Facility-Administered Medications  Medication Dose Route Frequency Provider Last Rate Last Admin  . acetaminophen (TYLENOL) tablet 650 mg  650 mg Oral Q6H PRN Deloria Lair, NP   650 mg at 05/08/19 0617  . albuterol (VENTOLIN HFA) 108 (90 Base) MCG/ACT inhaler 1 puff  1 puff Inhalation QID Nwoko, Agnes I, NP      . ARIPiprazole (ABILIFY) tablet 2 mg  2 mg Oral QHS Nwoko, Agnes I, NP   2 mg at 05/07/19 2143  . aspirin EC tablet 81 mg  81 mg Oral Daily Thorne Wirz, Myer Peer, MD   81 mg at 05/08/19 0749  . atorvastatin (LIPITOR) tablet 20 mg  20 mg Oral q1800 Breck Maryland, Myer Peer, MD   20 mg at 05/07/19 1619  . busPIRone (BUSPAR) tablet 10 mg  10 mg Oral BID Sharma Covert, MD   10 mg at 05/08/19 0749  . donepezil (ARICEPT) tablet 10 mg  10 mg Oral QHS Sharma Covert, MD   10 mg at 05/07/19 2144  . FLUoxetine (PROZAC) capsule 30 mg  30 mg Oral Daily Sharma Covert, MD   30 mg at 05/08/19 0749  . fluticasone (FLOVENT HFA) 44 MCG/ACT inhaler 2 puff  2 puff Inhalation BID Sharma Covert, MD   2 puff at 05/08/19 0749  . folic acid (FOLVITE) tablet 1 mg  1 mg Oral Daily Sharma Covert, MD   1 mg at 05/08/19 0749  . guaiFENesin (ROBITUSSIN) 100 MG/5ML solution 300 mg  15 mL Oral Q4H PRN Lindell Spar I, NP   300 mg at 05/04/19 1046  . hydrOXYzine (ATARAX/VISTARIL) tablet 10 mg  10 mg Oral TID PRN Brennan Litzinger, Myer Peer, MD   10 mg at 05/07/19 2143  . levothyroxine (SYNTHROID) tablet 25 mcg  25 mcg Oral Q0600 Minard Millirons, Myer Peer, MD   25 mcg at 05/08/19 0617  . magnesium hydroxide (MILK OF MAGNESIA) suspension 30 mL  30 mL Oral Daily PRN Dixon, Rashaun M, NP      . memantine (NAMENDA) tablet 5 mg  5 mg Oral BID Sharma Covert, MD   5 mg at 05/08/19 0749  . thiamine tablet 100 mg  100 mg Oral Daily Sharma Covert, MD   100 mg at 05/08/19 0749  . traZODone (DESYREL) tablet  50 mg  50 mg Oral QHS PRN Deloria Lair, NP   50 mg at 05/07/19 2143  . Vitamin D (Ergocalciferol) (DRISDOL) capsule 50,000 Units  50,000 Units Oral Q7 days Sharma Covert, MD   50,000 Units at 05/01/19 1400    Lab Results:  Results for orders placed or performed during the hospital encounter of 04/24/19 (from the past 48 hour(s))  Glucose, capillary     Status: Abnormal   Collection Time: 05/07/19  6:36 AM  Result Value Ref Range   Glucose-Capillary 108 (H) 70 - 99 mg/dL   Comment 1 Notify RN    Comment 2 Document in Chart   Glucose, capillary     Status: None   Collection Time: 05/08/19  6:05 AM  Result Value Ref Range   Glucose-Capillary 98 70 - 99 mg/dL   Comment 1 Notify RN    Comment 2 Document in Chart     Blood Alcohol level:  No results found for: [facility]  Metabolic Disorder Labs: Lab Results  Component Value Date   HGBA1C 5.9 (H) 04/25/2019   MPG 122.63 04/25/2019   No results found for: PROLACTIN Lab Results  Component Value Date   CHOL 174 04/25/2019   TRIG 119 04/25/2019   HDL 57 04/25/2019   CHOLHDL 3.1 04/25/2019   VLDL 24 04/25/2019   LDLCALC 93 04/25/2019   LDLCALC 92 05/07/2018    Physical Findings: AIMS: Facial and Oral Movements Muscles of Facial Expression: None, normal Lips and Perioral Area: None, normal Jaw: None, normal Tongue: None, normal,Extremity Movements Upper (arms, wrists, hands, fingers): None, normal Lower (legs, knees, ankles, toes): None, normal, Trunk Movements Neck, shoulders, hips: None, normal, Overall Severity Severity of abnormal  movements (highest score from questions above): None, normal Incapacitation due to abnormal movements: None, normal Patient's awareness of abnormal movements (rate only patient's report): No Awareness, Dental Status Current problems with teeth and/or dentures?: No Does patient usually wear dentures?: No  CIWA:    COWS:     Musculoskeletal: Strength & Muscle Tone: within normal  limits Gait & Station: normal Patient leans: N/A  Psychiatric Specialty Exam: Physical Exam  Review of Systems denies chest pain or shortness of breath, no cough, no vomiting, reports lower back pain  Blood pressure (!) 136/56, pulse 76, temperature 98.3 F (36.8 C), temperature source Oral, resp. rate 18, height '5\' 5"'$  (1.651 m), weight 84.4 kg, SpO2 100 %.Body mass index is 30.95 kg/m.  General Appearance: Fairly Groomed  Eye Contact:  Good  Speech:  Normal Rate  Volume:  Normal  Mood:  presents euthymic at this time  Affect:  appropriate and full in range   Thought Process:  Linear and Descriptions of Associations: Intact  Orientation:  Other:  alert and attentive  Thought Content:  no hallucinations, no delusions   Suicidal Thoughts:  No denies suicidal or self injurious ideations, denies homicidal or violent ideations  Homicidal Thoughts:  No  Memory:  immediate preserved, recent poor   Judgement:  improving  Insight:  Fair  Psychomotor Activity:  Normal- no restlessness or agitation  Concentration:  Concentration: Good and Attention Span: Good  Recall:  Poor  Fund of Knowledge:  Fair  Language:  Good  Akathisia:  No  Handed:  Right  AIMS (if indicated):     Assets:  Communication Skills Desire for Improvement Financial Resources/Insurance Leisure Time  ADL's:  Intact  Cognition:  WNL  Sleep:  Number of Hours: 6.5   Assessment -  74 year old widowed female (husband passed away 3 years ago), lives alone, has 1 adult son with whom she has limited contact, on Fish farm manager. Presented for worsening depression, suicidal ideations. With recent thoughts of wanting to traffic or stabbing self, neurovegetative symptoms,social isolation and decreased self-care. She expressed concern regarding memory loss over recent months and states that she recently had an episode where she got lost while going to a supermarket.  At present she is alert, attentive, calm, pleasant and  appears euthymic. Staff reports she has had episodes of irritability but no overt disruptive or agitated behaviors. Denies medication side effects. Denies SI and is future oriented . She has been consistent in stating she does not feel she can return home/live alone and has expressed interest in ALF/residential setting. CSW team working on discharge planning and report is that patient has been tentatively accepted to [facility], pending PASSR processing ( PPD requested as part of this process and is read negative)   Of note, is now ambulating with a walker which she states she found on unit , but OT determined her gait was steady and she ambulates without difficulty. We reviewed- currently reports she feels more comfortable with it. Will request PT consult to help determine if she benefits from this ambulation assistance device . Will discontinue Trazodone to simplify medication regimen and decrease potential medication side effects/possible anticholinergic side effects  Treatment Plan Summary: Daily contact with patient to assess and evaluate symptoms and progress in treatment and Medication management  Treatment Plan reviewed as below today 2/18 Encourage group and milieu participation Continue discharge planning - see above  Continue Aricept 10 mg PO QHS for Alzheimer's dementia Continue Namenda 5 mg PO BID For Alzheimer's  dementia Continue Prozac 30 mg PO daily for depression/anxiety Continue Abilify 2 mg PO QHS for mood stability Continue Buspar 10 mg PO BID for anxiety Continue ASA 81 mg PO daily for antiplatelet Continue Lipitor 20 mg PO daily for HLD Continue Albuterol inhaler 1-2 puffs QID for asthma Continue Flovent inhaler 2 puffs BID for asthma Continue folic acid 1 mg PO daily for supplementation Continue thiamine 100 mg PO daily for supplementation Continue vitamin D 50,000 units PO Q7days for supplementation Continue B12 100o mg IM Q week for B12 supplementation Continue  Synthroid 25 mcg PO daily for hypothyroidism PT consult to assess ambulation  Jenne Campus, MD 05/08/2019, 11:09 AM    Patient ID: Kara Huynh, female   DOB: [birth year], 74 y.o.   MRN: [id number]

## 2019-05-08 NOTE — Progress Notes (Signed)
[facility] Notes:  (Nursing/MHT/Case Management/Adjunct)  Date:  05/08/2019  Time: 2030 Type of Therapy:  wrap up group  Participation Level:  Active  Participation Quality:  Appropriate, Attentive, Sharing and Supportive  Affect:  Appropriate  Cognitive:  Alert  Insight:  Improving  Engagement in Group:  Engaged  Modes of Intervention:  Clarification, Education and Support  Summary of Progress/Problems: Positive thinking and positive change were discussed. Pt shared that she enjoyed talking with her son on the phone today.   Shellia Cleverly 05/08/2019, 8:47 PM

## 2019-05-08 NOTE — Progress Notes (Signed)
Pt had hard time sleeping this evening , pt given another 50 mg Trazodone per MAR .  Pt usually takes the 10 mg Vistaril to help with her sleep / anxiety

## 2019-05-08 NOTE — [facility] Counselor (Signed)
Patient's PASRR is still under review. PASRR request originally submitted on 02/09, additional documentation submitted 02/16.  Patient has been offered a bed at [facility]. Patient will need a PASRR number for admission.  Stephanie Acre, MSW, [place] Social Worker [facility] Adult Unit  [phone]

## 2019-05-09 LAB — CBC WITH DIFFERENTIAL/PLATELET
Abs Immature Granulocytes: 0.01 10*3/uL (ref 0.00–0.07)
Basophils Absolute: 0 10*3/uL (ref 0.0–0.1)
Basophils Relative: 0 %
Eosinophils Absolute: 0.1 10*3/uL (ref 0.0–0.5)
Eosinophils Relative: 4 %
HCT: 38 % (ref 36.0–46.0)
Hemoglobin: 12.1 g/dL (ref 12.0–15.0)
Immature Granulocytes: 0 %
Lymphocytes Relative: 34 %
Lymphs Abs: 1 10*3/uL (ref 0.7–4.0)
MCH: 30.3 pg (ref 26.0–34.0)
MCHC: 31.8 g/dL (ref 30.0–36.0)
MCV: 95.2 fL (ref 80.0–100.0)
Monocytes Absolute: 0.1 10*3/uL (ref 0.1–1.0)
Monocytes Relative: 5 %
Neutro Abs: 1.7 10*3/uL (ref 1.7–7.7)
Neutrophils Relative %: 57 %
Platelets: 157 10*3/uL (ref 150–400)
RBC: 3.99 MIL/uL (ref 3.87–5.11)
RDW: 14.4 % (ref 11.5–15.5)
WBC: 3 10*3/uL — ABNORMAL LOW (ref 4.0–10.5)
nRBC: 0 % (ref 0.0–0.2)

## 2019-05-09 LAB — BASIC METABOLIC PANEL
Anion gap: 7 (ref 5–15)
BUN: 18 mg/dL (ref 8–23)
CO2: 26 mmol/L (ref 22–32)
Calcium: 8.6 mg/dL — ABNORMAL LOW (ref 8.9–10.3)
Chloride: 105 mmol/L (ref 98–111)
Creatinine, Ser: 1.35 mg/dL — ABNORMAL HIGH (ref 0.44–1.00)
GFR calc Af Amer: 44 mL/min — ABNORMAL LOW (ref >60)
GFR calc non Af Amer: 38 mL/min — ABNORMAL LOW (ref >60)
Glucose, Bld: 108 mg/dL — ABNORMAL HIGH (ref 70–99)
Potassium: 4.2 mmol/L (ref 3.5–5.1)
Sodium: 138 mmol/L (ref 135–145)

## 2019-05-09 LAB — GLUCOSE, CAPILLARY: Glucose-Capillary: 107 mg/dL — ABNORMAL HIGH (ref 70–99)

## 2019-05-09 MED ORDER — HYDROXYZINE HCL 10 MG PO TABS
10.0000 mg | ORAL_TABLET | Freq: Three times a day (TID) | ORAL | Status: DC | PRN
  Administered 2019-05-09 – 2019-05-13 (×5): 10 mg via ORAL
  Filled 2019-05-09 (×5): qty 1

## 2019-05-09 NOTE — Progress Notes (Addendum)
[facility] MD Progress Note  05/09/2019 10:06 AM Kara Huynh  MRN:  [id number] Subjective: Patient reports she is feeling tired this morning.  States "last night I went out with the girls here, it was fun more than the already done and now I am feeling tired".  Denies medication side effects.  Denies SI. Objective : I have discussed case with treatment team and have met with patient. 74 year old widowed female (husband passed away 3 years ago), lives alone, has 1 adult son with whom she has limited contact, on Fish farm manager. Presented for worsening depression, suicidal ideations. With recent thoughts of wanting to traffic or stabbing self, neurovegetative symptoms,social isolation and decreased self-care. She expressed concern regarding memory loss over recent months and states that she recently had an episode where she got lost while going to a supermarket.  Patient presents alert, attentive, pleasant on approach.  She reports she feels "grouchy" today which she attributes to poor sleep.  As above, she reports that yesterday night she left the unit and went to a party with "the girls" (apparently referring to female patients on unit).  RN staff reports that female peers had been talking and laughing late last night which may explain patient's confusion.  When gently redirected she states "I do not know, maybe I am confused, I am just feeling tired now" She remains oriented x3 at this time. Denies medication side effects. Denies suicidal ideations. Visible on unit, polite/pleasant on approach. Labs reviewed-sodium 138, potassium 4.2, BUN 18, creatinine 1.35 (stable compared to prior labs).  WBC decreased (3.0).  ANC 1.7 (within normal) , serum glucose 108. Principal Problem: Alzheimer's dementia with behavioral disturbance ([place]) Diagnosis: Principal Problem:   Alzheimer's dementia with behavioral disturbance ([place]) Active Problems:   MDD (major depressive disorder)   Suicidal ideation  Total  Time spent with patient: 20 minutes  Past Psychiatric History: See admission H&P  Past Medical History:  Past Medical History:  Diagnosis Date  . Anxiety   . Arthritis   . Cancer ([place])    uterine  . Depression   . Endometrial polyp 02-17-13   11'14 Dx. endometrial cancer  . Hyperlipidemia   . Hypertension   . Hypothyroidism     Past Surgical History:  Procedure Laterality Date  . APPENDECTOMY    . BREAST SURGERY     biopsy  . CATARACT EXTRACTION, BILATERAL Bilateral 02-17-13   bilateral  . DILITATION & CURRETTAGE/HYSTROSCOPY WITH VERSAPOINT RESECTION N/A 01/31/2013   Procedure: DILATATION & CURETTAGE/HYSTEROSCOPY WITH CERVICAL BLOCK;  Surgeon: Marylynn Pearson, MD;  Location: [facility];  Service: Gynecology;  Laterality: N/A;  . LAPAROSCOPY FOR ECTOPIC PREGNANCY  1977   SALPINGOSTOMY (SIDE UNKNOWN)  . LAPAROTOMY Bilateral 02/18/2013   Procedure: EXPLORATORY LAPAROTOMY TOTAL ABDMONIAL HYSTERECTOMY BILATERAL SALPINGO OOPHORECTOMY ;  Surgeon: Alvino Chapel, MD;  Location: [facility];  Service: Gynecology;  Laterality: Bilateral;  . TOTAL ABDOMINAL HYSTERECTOMY W/ BILATERAL SALPINGOOPHORECTOMY    . TOTAL KNEE ARTHROPLASTY Bilateral    Family History:  Family History  Problem Relation Age of Onset  . Arthritis Mother   . Mental illness Mother   . Diabetes Mother   . Cancer Father        colon, lung, and prostate  . Arthritis Father   . Hyperlipidemia Father   . Stroke Father   . Heart disease Father   . Hypertension Father   . Colitis Father   . Esophageal cancer Neg Hx   . Rectal cancer Neg Hx   .  Stomach cancer Neg Hx   . Breast cancer Neg Hx    Family Psychiatric  History: See admission H&P Social History:  Social History   Substance and Sexual Activity  Alcohol Use Yes   Comment: twice monthly; had her 1st glass of wine in 2 year      Social History   Substance and Sexual Activity  Drug Use No    Social History   Socioeconomic History  . Marital  status: Widowed    Spouse name: Not on file  . Number of children: Not on file  . Years of education: Not on file  . Highest education level: Not on file  Occupational History  . Not on file  Tobacco Use  . Smoking status: Former Smoker    Packs/day: 1.50    Years: 47.00    Pack years: 70.50    Quit date: 07/04/2009    Years since quitting: 9.8  . Smokeless tobacco: Never Used  . Tobacco comment: quit a long time ago  Substance and Sexual Activity  . Alcohol use: Yes    Comment: twice monthly; had her 1st glass of wine in 2 year   . Drug use: No  . Sexual activity: Yes    Partners: Male    Birth control/protection: Post-menopausal  Other Topics Concern  . Not on file  Social History Narrative   Retired from being an Web designer   Married for 35 years   3 step children and 1 adopted child. They live all over the [name]   Social Determinants of Health   Financial Resource Strain:   . Difficulty of Paying Living Expenses: Not on file  Food Insecurity:   . Worried About Charity fundraiser in the Last Year: Not on file  . Ran Out of Food in the Last Year: Not on file  Transportation Needs:   . Lack of Transportation (Medical): Not on file  . Lack of Transportation (Non-Medical): Not on file  Physical Activity:   . Days of Exercise per Week: Not on file  . Minutes of Exercise per Session: Not on file  Stress:   . Feeling of Stress : Not on file  Social Connections:   . Frequency of Communication with Friends and Family: Not on file  . Frequency of Social Gatherings with Friends and Family: Not on file  . Attends Religious Services: Not on file  . Active Member of Clubs or Organizations: Not on file  . Attends Archivist Meetings: Not on file  . Marital Status: Not on file   Additional Social History:    Pain Medications: see MAR Prescriptions: see MAR Over the Counter: see MAR History of alcohol / drug use?: No history of alcohol / drug  abuse  Sleep: Fair  Appetite:  Good  Current Medications: Current Facility-Administered Medications  Medication Dose Route Frequency Provider Last Rate Last Admin  . acetaminophen (TYLENOL) tablet 650 mg  650 mg Oral Q6H PRN Deloria Lair, NP   650 mg at 05/08/19 2108  . albuterol (VENTOLIN HFA) 108 (90 Base) MCG/ACT inhaler 1 puff  1 puff Inhalation QID Lindell Spar I, NP   1 puff at 05/09/19 0717  . ARIPiprazole (ABILIFY) tablet 2 mg  2 mg Oral QHS Lindell Spar I, NP   2 mg at 05/08/19 2108  . aspirin EC tablet 81 mg  81 mg Oral Daily Scott Fix, Myer Peer, MD   81 mg at 05/09/19 0718  . atorvastatin (LIPITOR)  tablet 20 mg  20 mg Oral q1800 Jearldine Cassady, Myer Peer, MD   20 mg at 05/08/19 1809  . busPIRone (BUSPAR) tablet 10 mg  10 mg Oral BID Sharma Covert, MD   10 mg at 05/09/19 0718  . donepezil (ARICEPT) tablet 10 mg  10 mg Oral QHS Sharma Covert, MD   10 mg at 05/08/19 2108  . FLUoxetine (PROZAC) capsule 30 mg  30 mg Oral Daily Sharma Covert, MD   30 mg at 05/09/19 0718  . fluticasone (FLOVENT HFA) 44 MCG/ACT inhaler 2 puff  2 puff Inhalation BID Sharma Covert, MD   2 puff at 05/09/19 0717  . folic acid (FOLVITE) tablet 1 mg  1 mg Oral Daily Sharma Covert, MD   1 mg at 05/09/19 7494  . guaiFENesin (ROBITUSSIN) 100 MG/5ML solution 300 mg  15 mL Oral Q4H PRN Lindell Spar I, NP   300 mg at 05/04/19 1046  . levothyroxine (SYNTHROID) tablet 25 mcg  25 mcg Oral Q0600 Mukund Weinreb, Myer Peer, MD   25 mcg at 05/09/19 0720  . magnesium hydroxide (MILK OF MAGNESIA) suspension 30 mL  30 mL Oral Daily PRN Deloria Lair, NP      . memantine (NAMENDA) tablet 5 mg  5 mg Oral BID Sharma Covert, MD   5 mg at 05/09/19 0718  . thiamine tablet 100 mg  100 mg Oral Daily Sharma Covert, MD   100 mg at 05/09/19 4967  . Vitamin D (Ergocalciferol) (DRISDOL) capsule 50,000 Units  50,000 Units Oral Q7 days Sharma Covert, MD   50,000 Units at 05/08/19 1136    Lab Results:  Results  for orders placed or performed during the hospital encounter of 04/24/19 (from the past 48 hour(s))  Glucose, capillary     Status: None   Collection Time: 05/08/19  6:05 AM  Result Value Ref Range   Glucose-Capillary 98 70 - 99 mg/dL   Comment 1 Notify RN    Comment 2 Document in Chart   Glucose, capillary     Status: Abnormal   Collection Time: 05/09/19  6:01 AM  Result Value Ref Range   Glucose-Capillary 107 (H) 70 - 99 mg/dL   Comment 1 Notify RN    Comment 2 Document in Chart   CBC with Differential/Platelet     Status: Abnormal   Collection Time: 05/09/19  6:16 AM  Result Value Ref Range   WBC 3.0 (L) 4.0 - 10.5 K/uL   RBC 3.99 3.87 - 5.11 MIL/uL   Hemoglobin 12.1 12.0 - 15.0 g/dL   HCT 38.0 36.0 - 46.0 %   MCV 95.2 80.0 - 100.0 fL   MCH 30.3 26.0 - 34.0 pg   MCHC 31.8 30.0 - 36.0 g/dL   RDW 14.4 11.5 - 15.5 %   Platelets 157 150 - 400 K/uL   nRBC 0.0 0.0 - 0.2 %   Neutrophils Relative % 57 %   Neutro Abs 1.7 1.7 - 7.7 K/uL   Lymphocytes Relative 34 %   Lymphs Abs 1.0 0.7 - 4.0 K/uL   Monocytes Relative 5 %   Monocytes Absolute 0.1 0.1 - 1.0 K/uL   Eosinophils Relative 4 %   Eosinophils Absolute 0.1 0.0 - 0.5 K/uL   Basophils Relative 0 %   Basophils Absolute 0.0 0.0 - 0.1 K/uL   Immature Granulocytes 0 %   Abs Immature Granulocytes 0.01 0.00 - 0.07 K/uL    Comment: Performed at [facility]  [place] [address], [place], [place] [address]  Basic metabolic panel     Status: Abnormal   Collection Time: 05/09/19  6:16 AM  Result Value Ref Range   Sodium 138 135 - 145 mmol/L   Potassium 4.2 3.5 - 5.1 mmol/L   Chloride 105 98 - 111 mmol/L   CO2 26 22 - 32 mmol/L   Glucose, Bld 108 (H) 70 - 99 mg/dL   BUN 18 8 - 23 mg/dL   Creatinine, Ser 1.35 (H) 0.44 - 1.00 mg/dL   Calcium 8.6 (L) 8.9 - 10.3 mg/dL   GFR calc non Af Amer 38 (L) >60 mL/min   GFR calc Af Amer 44 (L) >60 mL/min   Anion gap 7 5 - 15    Comment: Performed at [facility] [address], [place], [place] [address]    Blood Alcohol level:  No results found for: [facility]  Metabolic Disorder Labs: Lab Results  Component Value Date   HGBA1C 5.9 (H) 04/25/2019   MPG 122.63 04/25/2019   No results found for: PROLACTIN Lab Results  Component Value Date   CHOL 174 04/25/2019   TRIG 119 04/25/2019   HDL 57 04/25/2019   CHOLHDL 3.1 04/25/2019   VLDL 24 04/25/2019   LDLCALC 93 04/25/2019   LDLCALC 92 05/07/2018    Physical Findings: AIMS: Facial and Oral Movements Muscles of Facial Expression: None, normal Lips and Perioral Area: None, normal Jaw: None, normal Tongue: None, normal,Extremity Movements Upper (arms, wrists, hands, fingers): None, normal Lower (legs, knees, ankles, toes): None, normal, Trunk Movements Neck, shoulders, hips: None, normal, Overall Severity Severity of abnormal movements (highest score from questions above): None, normal Incapacitation due to abnormal movements: None, normal Patient's awareness of abnormal movements (rate only patient's report): No Awareness, Dental Status Current problems with teeth and/or dentures?: No Does patient usually wear dentures?: No  CIWA:    COWS:     Musculoskeletal: Strength & Muscle Tone: within normal limits Gait & Station: normal Patient leans: N/A  Psychiatric Specialty Exam: Physical Exam  Review of Systems denies chest pain or shortness of breath, no cough, no vomiting, today does not report lower back pain  Blood pressure (!) 136/56, pulse 76, temperature 98.3 F (36.8 C), temperature source Oral, resp. rate 18, height '5\' 5"'$  (1.651 m), weight 84.4 kg, SpO2 100 %.Body mass index is 30.95 kg/m.  General Appearance: Fairly Groomed  Eye Contact:  Good  Speech:  Normal Rate  Volume:  Normal  Mood:  Reports feeling "grouchy" this morning.  Continues to appear generally euthymic  Affect:  Appropriate  Thought Process:  Linear and Descriptions of Associations: Intact   Orientation:  Other:  alert and attentive  Thought Content:  no hallucinations, no delusions   Suicidal Thoughts:  No denies suicidal or self injurious ideations, denies homicidal or violent ideations  Homicidal Thoughts:  No  Memory:  immediate preserved, recent poor   Judgement: Fair  Insight:  Fair  Psychomotor Activity:  Normal- no restlessness or agitation  Concentration:  Concentration: Good and Attention Span: Good  Recall:  Poor  Fund of Knowledge:  Fair  Language:  Good  Akathisia:  No  Handed:  Right  AIMS (if indicated):     Assets:  Communication Skills Desire for Improvement Financial Resources/Insurance Leisure Time  ADL's:  Intact  Cognition:  WNL  Sleep:  Number of Hours: 4   Assessment -  74 year old widowed female (husband passed away 3  years ago), lives alone, has 1 adult son with whom she has limited contact, on Fish farm manager. Presented for worsening depression, suicidal ideations. With recent thoughts of wanting to traffic or stabbing self, neurovegetative symptoms,social isolation and decreased self-care. She expressed concern regarding memory loss over recent months and states that she recently had an episode where she got lost while going to a supermarket.  Patient presents alert, attentive, and is oriented x3.  She appears confused this morning and stated that she slept poorly because she left the unit and went to a party with other female peers.  Staff reports that this confusion may be related to female peers laughing and staying up late last night.  Patient responds to gentle redirection/reorientation.  She is tolerating medications well and currently does not endorse side effects.  Sleep was fair last night. As discussed with treatment team, patient has been tentatively accepted to [facility].  PASSR has been submitted. PPD was read negative two days ago.  *RN reports indicate that patient seems to be sleeping better with and responding well to  low-dose Vistaril.  We will resume Vistaril at 10 mg 3 times daily as needed *Mild leukopenia noted (WBC decreased from 5.8 in early February to 3.0 today.  ANC within normal).  Have reviewed medications.  Abilify may rarely be associated with leukopenia, and is a new medication for patient.  Will discontinue and monitor.  Treatment Plan Summary: Daily contact with patient to assess and evaluate symptoms and progress in treatment and Medication management  Treatment Plan reviewed as below today 2/18 Encourage group and milieu participation Continue discharge planning -awaiting final acceptance to [facility]  Continue Aricept 10 mg PO QHS for Alzheimer's dementia Continue Namenda 5 mg PO BID For Alzheimer's dementia Continue Prozac 30 mg PO daily for depression/anxiety D/C Abilify Continue Buspar 10 mg PO BID for anxiety Continue ASA 81 mg PO daily for antiplatelet Continue Lipitor 20 mg PO daily for HLD Continue Albuterol inhaler 1-2 puffs QID for asthma Continue Flovent inhaler 2 puffs BID for asthma Continue folic acid 1 mg PO daily for supplementation Continue thiamine 100 mg PO daily for supplementation Continue vitamin D 50,000 units PO Q7days for supplementation Continue B12 1000 mg IM Q week for B12 supplementation Continue Synthroid 25 mcg PO daily for hypothyroidism Start Vistaril 10 mgrs TID PRN for anxiety Jenne Campus, MD 05/09/2019, 10:06 AM    Patient ID: Kara Huynh, female   DOB: 1945-09-21, 74 y.o.   MRN: [id number]

## 2019-05-09 NOTE — Progress Notes (Signed)
   05/09/19 2000  COVID-19 Daily Checkoff  Have you had a fever (temp > 37.80C/100F)  in the past 24 hours?  No  If you have had runny nose, nasal congestion, sneezing in the past 24 hours, has it worsened? No  COVID-19 EXPOSURE  Have you traveled outside the state in the past 14 days? No  Have you been in contact with someone with a confirmed diagnosis of COVID-19 or PUI in the past 14 days without wearing appropriate PPE? No  Have you been living in the same home as a person with confirmed diagnosis of COVID-19 or a PUI (household contact)? No  Have you been diagnosed with COVID-19? No

## 2019-05-09 NOTE — Progress Notes (Signed)
   05/09/19 2000  Psych Admission Type (Psych Patients Only)  Admission Status Voluntary  Psychosocial Assessment  Eye Contact Fair  Facial Expression Animated;Anxious;Pensive  Affect Anxious;Labile  Speech Logical/coherent  Interaction Assertive  Motor Activity Slow;Unsteady  Appearance/Hygiene In hospital gown  Behavior Characteristics Cooperative;Appropriate to situation  Mood Anxious;Depressed;Preoccupied  Aggressive Behavior  Effect No apparent injury  Thought Horticulturist, commercial of ideas  Content Blaming others;Preoccupation  Delusions None reported or observed  Perception Derealization  Hallucination None reported or observed  Judgment Poor  Confusion Moderate  Danger to Self  Current suicidal ideation? Denies  Danger to Others  Danger to Others None reported or observed

## 2019-05-09 NOTE — Plan of Care (Signed)
Progress note  D: pt found in bed; compliant with medication administration. Pt states they didn't sleep well and is confused as to the nights activities. Pt states she left and went 'out', returning at 3 am. Pt states they would like to rest this morning. Pt failed to fill out their self inventory. Pt continues to use the walker though necessity is not noted. Pt denies any physical pain. Pt denies si/hi/ah/vh and verbally agrees to approach staff if these become apparent or before harming themself/others while at [facility].  A: Pt provided support and encouragement. Pt given medication per protocol and standing orders. Q43m safety checks implemented and continued.  R: Pt safe on the unit. Will continue to monitor.  Pt progressing in the following metrics  Problem: Activity: Goal: Interest or engagement in leisure activities will improve Outcome: Progressing   Problem: Coping: Goal: Coping ability will improve Outcome: Progressing Goal: Will verbalize feelings Outcome: Progressing

## 2019-05-09 NOTE — [facility] Group Notes (Signed)
LCSW Group Therapy Note  05/09/2019    10:00-11:00am   Type of Therapy and Topic:  Group Therapy: Early Messages Received About Anger  Participation Level:  Did Not Attend   Description of Group:   In this group, patients shared and discussed the early messages received in their lives about anger through parental or other adult modeling, teaching, repression, punishment, violence, and more.  Participants identified how those childhood lessons influence even now how they usually or often react when angered.  The group discussed that anger is a secondary emotion and what may be the underlying emotional themes that come out through anger outbursts or that are ignored through anger suppression.  Finally, as a group there was a conversation about the workbook's quote that "There is nothing wrong with anger; it is just a sign something needs to change."     Therapeutic Goals: 1. Patients will identify one or more childhood message about anger that they received and how it was taught to them. 2. Patients will discuss how these childhood experiences have influenced and continue to influence their own expression or repression of anger even today. 3. Patients will explore possible primary emotions that tend to fuel their secondary emotion of anger. 4. Patients will learn that anger itself is normal and cannot be eliminated, and that healthier coping skills can assist with resolving conflict rather than worsening situations.  Summary of Patient Progress:  The patient did not attend   Therapeutic Modalities:   Cognitive Behavioral Therapy Motivation Interviewing  Kara Huynh  .

## 2019-05-09 NOTE — Progress Notes (Signed)
Pt up very irritable due to her roommate keep waking her up through the night. " why would they put the oldest one with teenagers waking me up through the night"

## 2019-05-10 MED ORDER — LIDOCAINE 5 % EX PTCH
1.0000 | MEDICATED_PATCH | CUTANEOUS | Status: DC
  Administered 2019-05-11 – 2019-05-13 (×3): 1 via TRANSDERMAL
  Filled 2019-05-10 (×6): qty 1

## 2019-05-10 NOTE — Progress Notes (Signed)
   05/10/19 1300  Psych Admission Type (Psych Patients Only)  Admission Status Voluntary  Psychosocial Assessment  Patient Complaints Anxiety;Confusion  Eye Contact Fair  Facial Expression Animated;Anxious;Pensive  Affect Anxious;Labile  Speech Logical/coherent  Interaction Assertive  Motor Activity Slow;Unsteady  Appearance/Hygiene In hospital gown  Behavior Characteristics Cooperative;Appropriate to situation  Mood Depressed;Anxious  Aggressive Behavior  Effect No apparent injury  Thought Process  Coherency WDL  Content Blaming others;Preoccupation  Delusions None reported or observed  Perception Derealization  Hallucination None reported or observed  Judgment Poor  Confusion Moderate  Danger to Self  Current suicidal ideation? Passive  Self-Injurious Behavior Some self-injurious ideation observed or expressed.  No lethal plan expressed   Agreement Not to Harm Self Yes  Description of Agreement  (verbal agreement)  Danger to Others  Danger to Others None reported or observed

## 2019-05-10 NOTE — [facility] Group Notes (Signed)
LCSW Group Therapy Note  05/10/2019    10:00-11:00am   Group Therapy Topic:  Communication (Passive, Passive-Aggressive, Aggressive, and Assertive)  Participation Level:  Active  Description of Group:   In this group, patients learned about the differences between passive, passive-aggressive, aggressive, and assertive communication. Descriptions were given and patients were asked to explore what their typical style of communicating might be.  Vignettes were used to explore these differences.  Patients were led in a discussion about how their communication style is working for or against them.  Therapeutic Goals: Patients will learn about 4 types of communication:  passive, passive-aggressive, aggressive, and assertive Patients will identify their own typical communication style Patients will discuss how their communication style works for them and against them. Patients will learn that communication can always be improved and can benefit not only the relationship they have with others, but also the relationship they have with themselves.  Summary of Patient Progress:  The patient shared that she typically uses "too much" communication.  She said she uses the fact that she is 74yo as an excuse to say whatever she wants.  Later during group she confronted in an assertive manner the two people sitting next to her who were having a side conversation, telling them that she has difficulty hearing, and apologized for this; however, CSW pointed out the elements of this request that made it an assertive and very appropriate communicaiton.  Therapeutic Modalities:   Processing Education  [name]

## 2019-05-10 NOTE — Progress Notes (Signed)
[facility] NOVEL CORONAVIRUS (COVID-19) DAILY CHECK-OFF SYMPTOMS - answer yes or no to each - every day NO YES  Have you had a fever in the past 24 hours?  . Fever (Temp > 37.80C / 100F) X   Have you had any of these symptoms in the past 24 hours? . New Cough .  Sore Throat  .  Shortness of Breath .  Difficulty Breathing .  Unexplained Body Aches   X   Have you had any one of these symptoms in the past 24 hours not related to allergies?   . Runny Nose .  Nasal Congestion .  Sneezing   X   If you have had runny nose, nasal congestion, sneezing in the past 24 hours, has it worsened?  X   EXPOSURES - check yes or no X   Have you traveled outside the state in the past 14 days?  X   Have you been in contact with someone with a confirmed diagnosis of COVID-19 or PUI in the past 14 days without wearing appropriate PPE?  X   Have you been living in the same home as a person with confirmed diagnosis of COVID-19 or a PUI (household contact)?    X   Have you been diagnosed with COVID-19?    X              What to do next: Answered NO to all: Answered YES to anything:   Proceed with unit schedule Follow the [facility] Inpatient Flowsheet.   

## 2019-05-10 NOTE — [facility] Group Notes (Signed)
Adult Psychoeducational Group Note  Date:  05/10/2019 Time:  3:41 PM  Group Topic/Focus:  Identifying Needs:   The focus of this group is to help patients identify their personal needs that have been historically problematic and identify healthy behaviors to address their needs.  Participation Level:  Did Not Attend   Paulino Rily 05/10/2019, 3:41 PM

## 2019-05-10 NOTE — [facility] Group Notes (Signed)
Adult Psychoeducational Group Note  Date:  05/10/2019 Time:  3:38 PM  Group Topic/Focus:  Emotional Education:   The focus of this group is to discuss what feelings/emotions are, and how they are experienced.  Participation Level:  Active  Participation Quality:  Attentive  Affect:  Flat  Cognitive:  Confused  Insight: Lacking  Engagement in Group:  Engaged  Modes of Intervention:  Discussion and Education  Additional Comments:  Pt attended the group and at least 4 times stated how old she was.   Paulino Rily 05/10/2019, 3:38 PM

## 2019-05-10 NOTE — Progress Notes (Signed)
[facility] MD Progress Note  05/10/2019 2:40 PM Kara Huynh  MRN:  [id number] Subjective: patient reports she is " upset " , stating that young people these days do not have the principles or manners that her generation had. Denies medication side effects. Denies suicidal ideations.  Objective : I have discussed case with treatment team and have met with patient. 74 year old widowed female (husband passed away 3 years ago), lives alone, has 1 adult son with whom she has limited contact, on Fish farm manager. Presented for worsening depression, suicidal ideations. With recent thoughts of wanting to traffic or stabbing self, neurovegetative symptoms,social isolation and decreased self-care. She expressed concern regarding memory loss over recent months and states that she recently had an episode where she got lost while going to a supermarket.  Patient presents vaguely irritable and briefly tearful today, although affect improves noticeably during session and smiles at times appropriately. Today , as above, focused on concerns that younger generations have less manners and principles than older generations. It is unclear why she is focused on this today and she cannot explain. Writer suspects it has to do with interactions with some younger peers on unit.  She also speaks about remote experiences relating to her first marriage and her father's disapproval .  Seems less concerned about upcoming events but does state " I can't go back home, it is no longer a home, it is just a house " and seems reassured and calmer when we review that treatment plan at this time is for placement. Gait slow but steady at this time, occasionally ambulating with walker. Currently denies suicidal ideations , and states " I am a Christian , I would not do that". Denies medication side effects.  Principal Problem: Alzheimer's dementia with behavioral disturbance ([place]) Diagnosis: Principal Problem:   Alzheimer's dementia with  behavioral disturbance ([place]) Active Problems:   MDD (major depressive disorder)   Suicidal ideation  Total Time spent with patient: 15 minutes  Past Psychiatric History: See admission H&P  Past Medical History:  Past Medical History:  Diagnosis Date  . Anxiety   . Arthritis   . Cancer ([place])    uterine  . Depression   . Endometrial polyp 02-17-13   11'14 Dx. endometrial cancer  . Hyperlipidemia   . Hypertension   . Hypothyroidism     Past Surgical History:  Procedure Laterality Date  . APPENDECTOMY    . BREAST SURGERY     biopsy  . CATARACT EXTRACTION, BILATERAL Bilateral 02-17-13   bilateral  . DILITATION & CURRETTAGE/HYSTROSCOPY WITH VERSAPOINT RESECTION N/A 01/31/2013   Procedure: DILATATION & CURETTAGE/HYSTEROSCOPY WITH CERVICAL BLOCK;  Surgeon: Marylynn Pearson, MD;  Location: [facility];  Service: Gynecology;  Laterality: N/A;  . LAPAROSCOPY FOR ECTOPIC PREGNANCY  1977   SALPINGOSTOMY (SIDE UNKNOWN)  . LAPAROTOMY Bilateral 02/18/2013   Procedure: EXPLORATORY LAPAROTOMY TOTAL ABDMONIAL HYSTERECTOMY BILATERAL SALPINGO OOPHORECTOMY ;  Surgeon: Alvino Chapel, MD;  Location: [facility];  Service: Gynecology;  Laterality: Bilateral;  . TOTAL ABDOMINAL HYSTERECTOMY W/ BILATERAL SALPINGOOPHORECTOMY    . TOTAL KNEE ARTHROPLASTY Bilateral    Family History:  Family History  Problem Relation Age of Onset  . Arthritis Mother   . Mental illness Mother   . Diabetes Mother   . Cancer Father        colon, lung, and prostate  . Arthritis Father   . Hyperlipidemia Father   . Stroke Father   . Heart disease Father   . Hypertension Father   .  Colitis Father   . Esophageal cancer Neg Hx   . Rectal cancer Neg Hx   . Stomach cancer Neg Hx   . Breast cancer Neg Hx    Family Psychiatric  History: See admission H&P Social History:  Social History   Substance and Sexual Activity  Alcohol Use Yes   Comment: twice monthly; had her 1st glass of wine in 2 year      Social  History   Substance and Sexual Activity  Drug Use No    Social History   Socioeconomic History  . Marital status: Widowed    Spouse name: Not on file  . Number of children: Not on file  . Years of education: Not on file  . Highest education level: Not on file  Occupational History  . Not on file  Tobacco Use  . Smoking status: Former Smoker    Packs/day: 1.50    Years: 47.00    Pack years: 70.50    Quit date: 07/04/2009    Years since quitting: 9.8  . Smokeless tobacco: Never Used  . Tobacco comment: quit a long time ago  Substance and Sexual Activity  . Alcohol use: Yes    Comment: twice monthly; had her 1st glass of wine in 2 year   . Drug use: No  . Sexual activity: Yes    Partners: Male    Birth control/protection: Post-menopausal  Other Topics Concern  . Not on file  Social History Narrative   Retired from being an Web designer   Married for 35 years   3 step children and 1 adopted child. They live all over the [name]   Social Determinants of Health   Financial Resource Strain:   . Difficulty of Paying Living Expenses: Not on file  Food Insecurity:   . Worried About Charity fundraiser in the Last Year: Not on file  . Ran Out of Food in the Last Year: Not on file  Transportation Needs:   . Lack of Transportation (Medical): Not on file  . Lack of Transportation (Non-Medical): Not on file  Physical Activity:   . Days of Exercise per Week: Not on file  . Minutes of Exercise per Session: Not on file  Stress:   . Feeling of Stress : Not on file  Social Connections:   . Frequency of Communication with Friends and Family: Not on file  . Frequency of Social Gatherings with Friends and Family: Not on file  . Attends Religious Services: Not on file  . Active Member of Clubs or Organizations: Not on file  . Attends Archivist Meetings: Not on file  . Marital Status: Not on file   Additional Social History:    Pain Medications: see  MAR Prescriptions: see MAR Over the Counter: see MAR History of alcohol / drug use?: No history of alcohol / drug abuse  Sleep: improving  Appetite:  Good  Current Medications: Current Facility-Administered Medications  Medication Dose Route Frequency Provider Last Rate Last Admin  . acetaminophen (TYLENOL) tablet 650 mg  650 mg Oral Q6H PRN Deloria Lair, NP   650 mg at 05/09/19 1755  . albuterol (VENTOLIN HFA) 108 (90 Base) MCG/ACT inhaler 1 puff  1 puff Inhalation QID Lindell Spar I, NP   1 puff at 05/10/19 1158  . aspirin EC tablet 81 mg  81 mg Oral Daily Omkar Stratmann, Myer Peer, MD   81 mg at 05/10/19 0826  . atorvastatin (LIPITOR) tablet 20 mg  20 mg Oral q1800 Kaitland Lewellyn, Myer Peer, MD   20 mg at 05/09/19 1755  . busPIRone (BUSPAR) tablet 10 mg  10 mg Oral BID Sharma Covert, MD   10 mg at 05/10/19 0630  . donepezil (ARICEPT) tablet 10 mg  10 mg Oral QHS Sharma Covert, MD   10 mg at 05/09/19 2104  . FLUoxetine (PROZAC) capsule 30 mg  30 mg Oral Daily Sharma Covert, MD   30 mg at 05/10/19 1601  . fluticasone (FLOVENT HFA) 44 MCG/ACT inhaler 2 puff  2 puff Inhalation BID Sharma Covert, MD   2 puff at 05/10/19 1157  . folic acid (FOLVITE) tablet 1 mg  1 mg Oral Daily Sharma Covert, MD   1 mg at 05/10/19 0825  . guaiFENesin (ROBITUSSIN) 100 MG/5ML solution 300 mg  15 mL Oral Q4H PRN Lindell Spar I, NP   300 mg at 05/04/19 1046  . hydrOXYzine (ATARAX/VISTARIL) tablet 10 mg  10 mg Oral TID PRN Tine Mabee, Myer Peer, MD   10 mg at 05/09/19 2105  . levothyroxine (SYNTHROID) tablet 25 mcg  25 mcg Oral Q0600 Natia Fahmy, Myer Peer, MD   25 mcg at 05/10/19 0825  . magnesium hydroxide (MILK OF MAGNESIA) suspension 30 mL  30 mL Oral Daily PRN Deloria Lair, NP      . memantine (NAMENDA) tablet 5 mg  5 mg Oral BID Sharma Covert, MD   5 mg at 05/10/19 0932  . thiamine tablet 100 mg  100 mg Oral Daily Sharma Covert, MD   100 mg at 05/10/19 0825  . Vitamin D (Ergocalciferol)  (DRISDOL) capsule 50,000 Units  50,000 Units Oral Q7 days Sharma Covert, MD   50,000 Units at 05/08/19 1136    Lab Results:  Results for orders placed or performed during the hospital encounter of 04/24/19 (from the past 48 hour(s))  Glucose, capillary     Status: Abnormal   Collection Time: 05/09/19  6:01 AM  Result Value Ref Range   Glucose-Capillary 107 (H) 70 - 99 mg/dL   Comment 1 Notify RN    Comment 2 Document in Chart   CBC with Differential/Platelet     Status: Abnormal   Collection Time: 05/09/19  6:16 AM  Result Value Ref Range   WBC 3.0 (L) 4.0 - 10.5 K/uL   RBC 3.99 3.87 - 5.11 MIL/uL   Hemoglobin 12.1 12.0 - 15.0 g/dL   HCT 38.0 36.0 - 46.0 %   MCV 95.2 80.0 - 100.0 fL   MCH 30.3 26.0 - 34.0 pg   MCHC 31.8 30.0 - 36.0 g/dL   RDW 14.4 11.5 - 15.5 %   Platelets 157 150 - 400 K/uL   nRBC 0.0 0.0 - 0.2 %   Neutrophils Relative % 57 %   Neutro Abs 1.7 1.7 - 7.7 K/uL   Lymphocytes Relative 34 %   Lymphs Abs 1.0 0.7 - 4.0 K/uL   Monocytes Relative 5 %   Monocytes Absolute 0.1 0.1 - 1.0 K/uL   Eosinophils Relative 4 %   Eosinophils Absolute 0.1 0.0 - 0.5 K/uL   Basophils Relative 0 %   Basophils Absolute 0.0 0.0 - 0.1 K/uL   Immature Granulocytes 0 %   Abs Immature Granulocytes 0.01 0.00 - 0.07 K/uL    Comment: Performed at [facility], [place] [address] [place], [place] [address]  Basic metabolic panel     Status: Abnormal   Collection Time: 05/09/19  6:16 AM  Result Value Ref Range   Sodium 138 135 - 145 mmol/L   Potassium 4.2 3.5 - 5.1 mmol/L   Chloride 105 98 - 111 mmol/L   CO2 26 22 - 32 mmol/L   Glucose, Bld 108 (H) 70 - 99 mg/dL   BUN 18 8 - 23 mg/dL   Creatinine, Ser 1.35 (H) 0.44 - 1.00 mg/dL   Calcium 8.6 (L) 8.9 - 10.3 mg/dL   GFR calc non Af Amer 38 (L) >60 mL/min   GFR calc Af Amer 44 (L) >60 mL/min   Anion gap 7 5 - 15    Comment: Performed at [place] [address], [place], [place] [address]     Blood Alcohol level:  No results found for: [facility]  Metabolic Disorder Labs: Lab Results  Component Value Date   HGBA1C 5.9 (H) 04/25/2019   MPG 122.63 04/25/2019   No results found for: PROLACTIN Lab Results  Component Value Date   CHOL 174 04/25/2019   TRIG 119 04/25/2019   HDL 57 04/25/2019   CHOLHDL 3.1 04/25/2019   VLDL 24 04/25/2019   LDLCALC 93 04/25/2019   LDLCALC 92 05/07/2018    Physical Findings: AIMS: Facial and Oral Movements Muscles of Facial Expression: None, normal Lips and Perioral Area: None, normal Jaw: None, normal Tongue: None, normal,Extremity Movements Upper (arms, wrists, hands, fingers): None, normal Lower (legs, knees, ankles, toes): None, normal, Trunk Movements Neck, shoulders, hips: None, normal, Overall Severity Severity of abnormal movements (highest score from questions above): None, normal Incapacitation due to abnormal movements: None, normal Patient's awareness of abnormal movements (rate only patient's report): No Awareness, Dental Status Current problems with teeth and/or dentures?: No Does patient usually wear dentures?: No  CIWA:    COWS:     Musculoskeletal: Strength & Muscle Tone: within normal limits Gait & Station: normal- slow but steady gait at this time Patient leans: N/A  Psychiatric Specialty Exam: Physical Exam  Review of Systems denies chest pain or shortness of breath, no cough, no vomiting, today does not report lower back pain  Blood pressure (!) 136/56, pulse 76, temperature 98.3 F (36.8 C), temperature source Oral, resp. rate 18, height '5\' 5"'$  (1.651 m), weight 84.4 kg, SpO2 100 %.Body mass index is 30.95 kg/m.  General Appearance: Well Groomed  Eye Contact:  Good  Speech:  Normal Rate  Volume:  Normal  Mood:  reports mood " so so"   Affect:  some lability, briefly tearful, improves during session  Thought Process:  Linear and Descriptions of Associations: Intact  Orientation:  Other:  alert and  attentive  Thought Content:  no hallucinations, no delusions   Suicidal Thoughts:  No denies suicidal or self injurious ideations, denies homicidal or violent ideations  Homicidal Thoughts:  No  Memory:  immediate preserved, recent poor   Judgement: Fair  Insight:  Fair  Psychomotor Activity:  Normal- no restlessness or agitation  Concentration:  Concentration: Good and Attention Span: Good  Recall:  Poor  Fund of Knowledge:  Fair  Language:  Good  Akathisia:  No  Handed:  Right  AIMS (if indicated):     Assets:  Communication Skills Desire for Improvement Financial Resources/Insurance Leisure Time  ADL's:  Intact  Cognition:  WNL  Sleep:  Number of Hours: 5.25   Assessment -  74 year old widowed female (husband passed away 3 years ago), lives alone, has 1 adult son with whom she has limited contact, on Fish farm manager. Presented  for worsening depression, suicidal ideations. With recent thoughts of wanting to traffic or stabbing self, neurovegetative symptoms,social isolation and decreased self-care. She expressed concern regarding memory loss over recent months and states that she recently had an episode where she got lost while going to a supermarket.  Patient presents alert, attentive, pleasant on approach and cooperative, focused on concerns about younger people not having the manners of older generations but unable to stay why she is concerned about this today. Although appears to forget that treatment goal continues to be  placement in residential /ALF setting , she seems noticeably reassured by this when reviewed. Denies medication side effects. Denies SI.   Treatment Plan Summary: Daily contact with patient to assess and evaluate symptoms and progress in treatment and Medication management  Treatment Plan reviewed as below today 2/20 Encourage group and milieu participation Continue discharge planning -awaiting final acceptance to [facility]  Continue Aricept 10 mg PO  QHS for Alzheimer's dementia Continue Namenda 5 mg PO BID For Alzheimer's dementia Continue Prozac 30 mg PO daily for depression/anxiety Continue Buspar 10 mg PO BID for anxiety Continue ASA 81 mg PO daily for antiplatelet Continue Lipitor 20 mg PO daily for HLD Continue Albuterol inhaler 1-2 puffs QID for asthma Continue Flovent inhaler 2 puffs BID for asthma Continue folic acid 1 mg PO daily for supplementation Continue thiamine 100 mg PO daily for supplementation Continue vitamin D 50,000 units PO Q7days for supplementation Continue B12 1000 mg IM Q week for B12 supplementation Continue Synthroid 25 mcg PO daily for hypothyroidism ContinueVistaril 10 mgrs TID PRN for anxiety Will recheck CBC tomorrow to monitor WBC  Jenne Campus, MD 05/10/2019, 2:40 PM    Patient ID: Kara Huynh, female   DOB: 1945/11/02, 74 y.o.   MRN: [id number]

## 2019-05-11 LAB — GLUCOSE, CAPILLARY: Glucose-Capillary: 100 mg/dL — ABNORMAL HIGH (ref 70–99)

## 2019-05-11 MED ORDER — CYANOCOBALAMIN 1000 MCG/ML IJ SOLN
1000.0000 ug | Freq: Once | INTRAMUSCULAR | Status: AC
  Administered 2019-05-12: 1000 ug via INTRAMUSCULAR
  Filled 2019-05-11: qty 1

## 2019-05-11 NOTE — Progress Notes (Signed)
[place] NOVEL CORONAVIRUS (COVID-19) DAILY CHECK-OFF SYMPTOMS - answer yes or no to each - every day NO YES  Have you had a fever in the past 24 hours?  . Fever (Temp > 37.80C / 100F) X   Have you had any of these symptoms in the past 24 hours? . New Cough .  Sore Throat  .  Shortness of Breath .  Difficulty Breathing .  Unexplained Body Aches   X   Have you had any one of these symptoms in the past 24 hours not related to allergies?   . Runny Nose .  Nasal Congestion .  Sneezing   X   If you have had runny nose, nasal congestion, sneezing in the past 24 hours, has it worsened?  X   EXPOSURES - check yes or no X   Have you traveled outside the state in the past 14 days?  X   Have you been in contact with someone with a confirmed diagnosis of COVID-19 or PUI in the past 14 days without wearing appropriate PPE?  X   Have you been living in the same home as a person with confirmed diagnosis of COVID-19 or a PUI (household contact)?    X   Have you been diagnosed with COVID-19?    X              What to do next: Answered NO to all: Answered YES to anything:   Proceed with unit schedule Follow the [facility] Inpatient Flowsheet.   

## 2019-05-11 NOTE — [facility] Group Notes (Signed)
Adult Psychoeducational Group Note  Date:  05/11/2019 Time:  9:00AM-10:00AM  Group Topic/Focus:  Progressive Relaxation.  A group that uses progressive relaxation with the different parts of the body tensing and relaxing. Also learning deep breathing and visualization  Participation Level:  Active  Participation Quality:  Appropriate  Affect:  Blunted  Cognitive:  Appropriate  Insight: Appropriate  Engagement in Group:  Engaged  Modes of Intervention:  Activity and Education  Additional Comments:  Pt participated fully in the grouop  Bryson Dames A  05/11/2019   2:50 PM

## 2019-05-11 NOTE — Progress Notes (Signed)
   05/11/19 1100  Psych Admission Type (Psych Patients Only)  Admission Status Voluntary  Psychosocial Assessment  Patient Complaints Anxiety;Confusion  Eye Contact Fair  Facial Expression Anxious  Affect Appropriate to circumstance  Speech Logical/coherent  Interaction Assertive  Motor Activity Slow;Unsteady (utilizes walker)  Appearance/Hygiene In hospital gown;Disheveled  Behavior Characteristics Cooperative;Appropriate to situation  Mood Depressed;Anxious  Aggressive Behavior  Effect No apparent injury  Thought Process  Coherency WDL  Content WDL  Delusions None reported or observed  Perception Derealization  Hallucination None reported or observed  Judgment Poor  Confusion Moderate  Danger to Self  Current suicidal ideation? Denies  Self-Injurious Behavior No self-injurious ideation or behavior indicators observed or expressed   Agreement Not to Harm Self Yes  Description of Agreement verbal  Danger to Others  Danger to Others None reported or observed

## 2019-05-11 NOTE — Progress Notes (Signed)
   05/11/19 2110  COVID-19 Daily Checkoff  Have you had a fever (temp > 37.80C/100F)  in the past 24 hours?  No  If you have had runny nose, nasal congestion, sneezing in the past 24 hours, has it worsened? No  COVID-19 EXPOSURE  Have you traveled outside the state in the past 14 days? No  Have you been in contact with someone with a confirmed diagnosis of COVID-19 or PUI in the past 14 days without wearing appropriate PPE? No  Have you been living in the same home as a person with confirmed diagnosis of COVID-19 or a PUI (household contact)? No  Have you been diagnosed with COVID-19? No

## 2019-05-11 NOTE — [facility] Group Notes (Signed)
Psychoeducational Group Note  Date:  05-11-19 Time:  1300  Group Topic/Focus:  Making Healthy Choices:   The focus of this group is to help patients identify negative/unhealthy choices they were using prior to admission and identify positive/healthier coping strategies to replace them upon discharge.  Participation Level:  Active  Participation Quality:  Appropriate  Affect:  Appropriate  Cognitive:  Oriented  Insight:  Improving  Engagement in Group:  Engaged  Additional Comments:  Pt was able to identify unhealthy choices and to make healthier ones  Bryson Dames A 1520

## 2019-05-11 NOTE — Progress Notes (Signed)
   05/11/19 2113  Psych Admission Type (Psych Patients Only)  Admission Status Voluntary  Psychosocial Assessment  Patient Complaints Anxiety  Eye Contact Fair  Facial Expression Anxious  Affect Appropriate to circumstance  Speech Logical/coherent  Interaction Assertive  Motor Activity Slow;Unsteady (utilizes walker)  Appearance/Hygiene In hospital gown;Disheveled  Behavior Characteristics Appropriate to situation  Mood Anxious;Pleasant  Thought Process  Coherency WDL  Content WDL  Delusions None reported or observed  Perception Derealization  Hallucination None reported or observed  Judgment Poor  Confusion Moderate  Danger to Self  Current suicidal ideation? Denies  Self-Injurious Behavior No self-injurious ideation or behavior indicators observed or expressed   Agreement Not to Harm Self Yes  Description of Agreement verbal  Danger to Others  Danger to Others None reported or observed
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
## 2019-05-11 NOTE — BHH Group Notes (Signed)
LCSW Group Therapy Note  05/11/2019    10:00-11:00am   Type of Therapy and Topic:  Group Therapy: Shame and Its Impact   Participation Level:  Active   Description of Group:   In this group, patients shared and discussed that guilt is the negative feeling we have when we've done something wrong, while shame is the negative feeling we have simply about "being."  In listening to each other share, patients learned that humans are all imperfect and that there is no shame in this.  We discussed how it could positively impact our wellbeing by accepting our faults as part of our being that can be worked on but does not have to shame [name].    Therapeutic Goals: Patients will learn the difference between guilt and shame. Patients will share their current shame feelings and how this has impacted their current lives. Patients will explore possible ways to think differently about those parts of their bodies, feelings, and lives about which they do have shame. Patients will learn that shame is universal, and that keeping our shame a secret actually increases its hold on [name].  Summary of Patient Progress:  The patient shared that she feels shame about anyone giving her a compliment because her mother always told her that "self praise stinks."  Mostly the patient was focused on her memory problems and was difficult to keep on task.  Therapeutic Modalities:   Cognitive Behavioral Therapy Motivation Interviewing  [name]  .

## 2019-05-11 NOTE — Progress Notes (Signed)
   05/11/19 0013  COVID-19 Daily Checkoff  Have you had a fever (temp > 37.80C/100F)  in the past 24 hours?  No  If you have had runny nose, nasal congestion, sneezing in the past 24 hours, has it worsened? No  COVID-19 EXPOSURE  Have you traveled outside the state in the past 14 days? No  Have you been in contact with someone with a confirmed diagnosis of COVID-19 or PUI in the past 14 days without wearing appropriate PPE? No  Have you been living in the same home as a person with confirmed diagnosis of COVID-19 or a PUI (household contact)? No  Have you been diagnosed with COVID-19? No

## 2019-05-11 NOTE — Progress Notes (Signed)
   05/11/19 0016  Psych Admission Type (Psych Patients Only)  Admission Status Voluntary  Psychosocial Assessment  Patient Complaints Anxiety;Confusion  Eye Contact Fair  Facial Expression Anxious  Affect Appropriate to circumstance  Speech Logical/coherent  Interaction Assertive  Motor Activity Slow;Unsteady (utilizes walker)  Appearance/Hygiene In hospital gown;Disheveled  Behavior Characteristics Appropriate to situation  Mood Anxious;Depressed  Thought Process  Coherency WDL  Content WDL  Delusions None reported or observed  Perception Derealization  Hallucination None reported or observed  Judgment Poor  Confusion Moderate  Danger to Self  Current suicidal ideation? Denies  Self-Injurious Behavior No self-injurious ideation or behavior indicators observed or expressed   Agreement Not to Harm Self Yes  Description of Agreement verbal  Danger to Others  Danger to Others None reported or observed

## 2019-05-12 LAB — GLUCOSE, CAPILLARY: Glucose-Capillary: 102 mg/dL — ABNORMAL HIGH (ref 70–99)

## 2019-05-12 NOTE — Progress Notes (Signed)
   05/12/19 2300  Psych Admission Type (Psych Patients Only)  Admission Status Voluntary  Psychosocial Assessment  Patient Complaints Anxiety;Worrying;Confusion  Eye Contact Fair  Facial Expression Anxious  Affect Appropriate to circumstance  Speech Logical/coherent  Interaction Assertive  Motor Activity Slow;Unsteady (utilizes walker)  Appearance/Hygiene Unremarkable  Behavior Characteristics Cooperative  Mood Anxious  Thought Process  Coherency WDL  Content WDL  Delusions None reported or observed  Perception Derealization  Hallucination None reported or observed  Judgment Poor  Confusion Moderate  Danger to Self  Current suicidal ideation? Denies  Self-Injurious Behavior No self-injurious ideation or behavior indicators observed or expressed   Agreement Not to Harm Self Yes  Description of Agreement verbal  Danger to Others  Danger to Others None reported or observed

## 2019-05-12 NOTE — BHH Counselor (Addendum)
CSW is aware that a friend of the patient who identified herself as "Kara Huynh," [phone]) has called [facility] and arrived to the facility in an attempt to assist patient with paying overdue bills at her home: [address], [address] of [employer], and $176.16 [employer]. Kara Huynh has also brought a check from [facility]) in the amount of $1322.42 for the patient to sign to be deposited. It is unclear if patient's belongings have been sold while hospitalized.  Ms.Krantz stated she needed to get the patient to sign some checks and provide her driver's license to assist in paying the bills.   Patient initially did not recall who Kara Huynh is. Patient has not signed consents for Ms.Krantz at an earlier time. Patient remains inpatient at [facility] while waiting for placement into an ALF or memory care facility. At this time, patient is her own legal guardian, she has no payee, and she has no financial or health care power of attorney.   CSW received a call from patient's son, Kara Huynh [phone]). CSW spoke with son once before on 02/09. Son received a call from [place]. Son states Kara Huynh is "someone who has been extremely helpful in recent years," and has provided house keeping services for the patient weekly since she moved to [place]. Son believes Ms.Krantz to be trustworthy and to have good intentions.   Son spoke to patient after speaking with CSW. After the call, patient reports that she does remember Ms.Krantz as "the cleaning lady."   RN and MD notified.  CSW following.  Kara Huynh, MSW, [place] Social Worker [facility]  [phone]

## 2019-05-12 NOTE — BHH Group Notes (Signed)
LCSW Group Therapy Notes 05/12/2019 2:27 PM  Type of Therapy and Topic: Group Therapy: Overcoming Obstacles  Participation Level: Active  Description of Group:  In this group patients will be encouraged to explore what they see as obstacles to their own wellness and recovery. They will be guided to discuss their thoughts, feelings, and behaviors related to these obstacles. The group will process together ways to cope with barriers, with attention given to specific choices patients can make. Each patient will be challenged to identify changes they are motivated to make in order to overcome their obstacles. This group will be process-oriented, with patients participating in exploration of their own experiences as well as giving and receiving support and challenge from other group members.  Therapeutic Goals: 1. Patient will identify personal and current obstacles as they relate to admission. 2. Patient will identify barriers that currently interfere with their wellness or overcoming obstacles.  3. Patient will identify feelings, thought process and behaviors related to these barriers. 4. Patient will identify two changes they are willing to make to overcome these obstacles:   Summary of Patient Progress Memory issues are patient's primary obstacle. Patient was in a noticeably depressed mood, "I used to have a life."    Therapeutic Modalities:  Cognitive Behavioral Therapy Solution Focused Therapy Motivational Interviewing Relapse Prevention [employer], MSW, [place] 05/12/2019 2:27 PM

## 2019-05-12 NOTE — Progress Notes (Signed)
   05/12/19 1000  Psych Admission Type (Psych Patients Only)  Admission Status Voluntary  Psychosocial Assessment  Patient Complaints Anxiety;Worrying;Irritability  Eye Contact Fair  Facial Expression Anxious  Affect Appropriate to circumstance  Speech Logical/coherent  Interaction Assertive  Motor Activity Slow;Unsteady (utilizes walker)  Appearance/Hygiene Unremarkable  Behavior Characteristics Appropriate to situation;Anxious  Mood Anxious  Thought Process  Coherency WDL  Content WDL  Delusions None reported or observed  Perception Derealization  Hallucination None reported or observed  Judgment Poor  Confusion Moderate  Danger to Self  Current suicidal ideation? Denies  Self-Injurious Behavior No self-injurious ideation or behavior indicators observed or expressed   Agreement Not to Harm Self Yes  Description of Agreement verbal  Danger to Others  Danger to Others None reported or observed

## 2019-05-12 NOTE — Tx Team (Signed)
Interdisciplinary Treatment and Diagnostic Plan Update  05/12/2019 Time of Session: 8:50am Kara Huynh MRN: [id number]  Principal Diagnosis: Alzheimer's dementia with behavioral disturbance [facility])  Secondary Diagnoses: Principal Problem:   Alzheimer's dementia with behavioral disturbance ([place]) Active Problems:   MDD (major depressive disorder)   Suicidal ideation   Current Medications:  Current Facility-Administered Medications  Medication Dose Route Frequency Provider Last Rate Last Admin  . acetaminophen (TYLENOL) tablet 650 mg  650 mg Oral Q6H PRN Deloria Lair, NP   650 mg at 05/10/19 1759  . albuterol (VENTOLIN HFA) 108 (90 Base) MCG/ACT inhaler 1 puff  1 puff Inhalation QID Lindell Spar I, NP   1 puff at 05/12/19 1013  . aspirin EC tablet 81 mg  81 mg Oral Daily Cobos, Fernando A, MD   81 mg at 05/12/19 1010  . atorvastatin (LIPITOR) tablet 20 mg  20 mg Oral q1800 Cobos, Myer Peer, MD   20 mg at 05/11/19 1751  . busPIRone (BUSPAR) tablet 10 mg  10 mg Oral BID Sharma Covert, MD   10 mg at 05/12/19 1011  . donepezil (ARICEPT) tablet 10 mg  10 mg Oral QHS Sharma Covert, MD   10 mg at 05/11/19 2148  . FLUoxetine (PROZAC) capsule 30 mg  30 mg Oral Daily Sharma Covert, MD   30 mg at 05/12/19 1011  . fluticasone (FLOVENT HFA) 44 MCG/ACT inhaler 2 puff  2 puff Inhalation BID Sharma Covert, MD   2 puff at 05/12/19 1012  . folic acid (FOLVITE) tablet 1 mg  1 mg Oral Daily Sharma Covert, MD   1 mg at 05/12/19 1010  . guaiFENesin (ROBITUSSIN) 100 MG/5ML solution 300 mg  15 mL Oral Q4H PRN Lindell Spar I, NP   300 mg at 05/04/19 1046  . hydrOXYzine (ATARAX/VISTARIL) tablet 10 mg  10 mg Oral TID PRN Cobos, Myer Peer, MD   10 mg at 05/11/19 2148  . levothyroxine (SYNTHROID) tablet 25 mcg  25 mcg Oral Q0600 Cobos, Myer Peer, MD   25 mcg at 05/12/19 [phone]  . lidocaine (LIDODERM) 5 % 1 patch  1 patch Transdermal Q24H Cobos, Myer Peer, MD   1 patch at 05/11/19 [phone]   . magnesium hydroxide (MILK OF MAGNESIA) suspension 30 mL  30 mL Oral Daily PRN Dixon, Rashaun M, NP      . memantine (NAMENDA) tablet 5 mg  5 mg Oral BID Sharma Covert, MD   5 mg at 05/12/19 1011  . thiamine tablet 100 mg  100 mg Oral Daily Sharma Covert, MD   100 mg at 05/12/19 1011  . Vitamin D (Ergocalciferol) (DRISDOL) capsule 50,000 Units  50,000 Units Oral Q7 days Sharma Covert, MD   50,000 Units at 05/08/19 1136   PTA Medications: Medications Prior to Admission  Medication Sig Dispense Refill Last Dose  . aspirin EC 81 MG tablet Take 81 mg by mouth daily.     Marland Kitchen atorvastatin (LIPITOR) 20 MG tablet TAKE 1 TABLET BY MOUTH EVERY DAY 90 tablet 3   . FLUoxetine (PROZAC) 40 MG capsule TAKE 1 CAPSULE BY MOUTH EVERY DAY 90 capsule 1   . levothyroxine (SYNTHROID) 25 MCG tablet TAKE 1 TABLET EVERY MORNING AS DIRECTED 90 tablet 3   . lisinopril (PRINIVIL,ZESTRIL) 5 MG tablet Take 1 tablet (5 mg total) by mouth daily. 90 tablet 3     Patient Stressors:    Patient Strengths:    Treatment Modalities: Medication  Management, Group therapy, Case management,  1 to 1 session with clinician, Psychoeducation, Recreational therapy.   Physician Treatment Plan for Primary Diagnosis: Alzheimer's dementia with behavioral disturbance ([place]) Long Term Goal(s): Improvement in symptoms so as ready for discharge Improvement in symptoms so as ready for discharge   Short Term Goals: Ability to identify changes in lifestyle to reduce recurrence of condition will improve Ability to verbalize feelings will improve Ability to disclose and discuss suicidal ideas Ability to demonstrate self-control will improve Ability to identify and develop effective coping behaviors will improve Ability to maintain clinical measurements within normal limits will improve Ability to identify changes in lifestyle to reduce recurrence of condition will improve Ability to maintain clinical measurements within normal  limits will improve  Medication Management: Evaluate patient's response, side effects, and tolerance of medication regimen.  Therapeutic Interventions: 1 to 1 sessions, Unit Group sessions and Medication administration.  Evaluation of Outcomes: Progressing  Physician Treatment Plan for Secondary Diagnosis: Principal Problem:   Alzheimer's dementia with behavioral disturbance ([place]) Active Problems:   MDD (major depressive disorder)   Suicidal ideation  Long Term Goal(s): Improvement in symptoms so as ready for discharge Improvement in symptoms so as ready for discharge   Short Term Goals: Ability to identify changes in lifestyle to reduce recurrence of condition will improve Ability to verbalize feelings will improve Ability to disclose and discuss suicidal ideas Ability to demonstrate self-control will improve Ability to identify and develop effective coping behaviors will improve Ability to maintain clinical measurements within normal limits will improve Ability to identify changes in lifestyle to reduce recurrence of condition will improve Ability to maintain clinical measurements within normal limits will improve     Medication Management: Evaluate patient's response, side effects, and tolerance of medication regimen.  Therapeutic Interventions: 1 to 1 sessions, Unit Group sessions and Medication administration.  Evaluation of Outcomes: Progressing   RN Treatment Plan for Primary Diagnosis: Alzheimer's dementia with behavioral disturbance ([place]) Long Term Goal(s): Knowledge of disease and therapeutic regimen to maintain health will improve  Short Term Goals: Ability to verbalize feelings will improve, Ability to disclose and discuss suicidal ideas, Ability to identify and develop effective coping behaviors will improve and Compliance with prescribed medications will improve  Medication Management: RN will administer medications as ordered by provider, will assess and evaluate  patient's response and provide education to patient for prescribed medication. RN will report any adverse and/or side effects to prescribing provider.  Therapeutic Interventions: 1 on 1 counseling sessions, Psychoeducation, Medication administration, Evaluate responses to treatment, Monitor vital signs and CBGs as ordered, Perform/monitor CIWA, COWS, AIMS and Fall Risk screenings as ordered, Perform wound care treatments as ordered.  Evaluation of Outcomes: Progressing   LCSW Treatment Plan for Primary Diagnosis: Alzheimer's dementia with behavioral disturbance ([place]) Long Term Goal(s): Safe transition to appropriate next level of care at discharge, Engage patient in therapeutic group addressing interpersonal concerns.  Short Term Goals: Engage patient in aftercare planning with referrals and resources  Therapeutic Interventions: Assess for all discharge needs, 1 to 1 time with Social worker, Explore available resources and support systems, Assess for adequacy in community support network, Educate family and significant other(s) on suicide prevention, Complete Psychosocial Assessment, Interpersonal group therapy.  Evaluation of Outcomes: Progressing   Progress in Treatment: Attending groups: Yes. Participating in groups: Yes. Taking medication as prescribed: Yes. Toleration medication: Yes. Family/Significant other contact made: Yes, individual(s) contacted:  son, Dian Situ. Patient understands diagnosis: Yes. Discussing patient identified problems/goals with staff:  Yes. Medical problems stabilized or resolved: Yes. Denies suicidal/homicidal ideation: No. Passive SI Issues/concerns per patient self-inventory: No. Other:   New problem(s) identified: None   New Short Term/Long Term Goal(s):Detox, medication stabilization, elimination of SI thoughts, development of comprehensive mental wellness plan.    Patient Goals:  "To have some peace and to be kinder to myself"   Discharge Plan or  Barriers: CSW team is working to place patient in an ALF or memory care facility to facilitate a safe discharge plan. [facility] has offered patient a bed pending PASRR number and negative TB test.  Reason for Continuation of Hospitalization: Anxiety Other; describe Safe discharge plan.  Estimated Length of Stay: TBD   Attendees: Patient: Kara Huynh  05/12/2019 10:28 AM  Physician: Dr. Neita Garnet, MD Dr.Clary; Dr. Neita Garnet, MD 05/12/2019 10:28 AM  Nursing:  05/12/2019 10:28 AM  RN Care Manager: 05/12/2019 10:28 AM  Social Worker: Radonna Ricker, [place], LCSWA; [place], [place] 05/12/2019 10:28 AM  Recreational Therapist:  05/12/2019 10:28 AM  Other: Harriett Sine, NP 05/12/2019 10:28 AM  Other:  05/12/2019 10:28 AM  Other: 05/12/2019 10:28 AM    Scribe for Treatment Team: Marylee Floras, [place] 05/12/2019 10:28 AM

## 2019-05-12 NOTE — BHH Counselor (Signed)
CSW left a message for [employer] at [facility] to ensure that patient still has a bed offer. CSW requested a returned call.  Stephanie Acre, MSW, [place] Social Worker [facility]  [phone]

## 2019-05-12 NOTE — BHH Counselor (Addendum)
-  CSW informed by Sunday Shams, [facility] Program Lead that this patient DOES NOT need a PASRR number for ALF placement. Patient is eligible for RSVP screening for placement. CSW completed RSVP referral. RSVP # [phone]  CSW still awaiting callback from [facility] is still pending. CSW contacted [facility] [employer] [phone]). Original screening submitted 02/09. Additional documents uploaded 02/16.    -CSW left a detailed voicemail for Sunday Shams at [phone].   CSW following for a safe disposition.  Stephanie Acre, MSW, [place] Social Worker [facility] Unit  [phone]

## 2019-05-12 NOTE — Progress Notes (Signed)
[facility] MD Progress Note  05/12/2019 3:02 PM BATUL DIEGO  MRN:  [id number] Subjective: Reports "I am okay".  Today he does ruminate about the death of her husband and states "I know why he was taken instead of me".  Denies medication side effects at this time. Although reports lower back pain appears less concerned about this today and currently does not appear to be in any acute distress. Objective : I have discussed case with treatment team and have met with patient. 61 year old widowed female (husband passed away 3 years ago), lives alone, has 1 adult son with whom she has limited contact, on Fish farm manager. Presented for worsening depression, suicidal ideations. With recent thoughts of wanting to traffic or stabbing self, neurovegetative symptoms,social isolation and decreased self-care. She expressed concern regarding memory loss over recent months and states that she recently had an episode where she got lost while going to a supermarket.  Patient presents alert, attentive, pleasant on approach.  Today focusing more on her deceased husband.  States that she misses him, and as above makes a statement that she would have preferred that she had die rather than him.  She denies having any suicidal plan or intention and states that suicide is against her personal/religious beliefs . Affect is variable.  Does become tearful when speaking about her husband but brightens when redirected to other topics.  No disruptive or agitated behaviors. Currently denies medication side effects. Patient seen by PT for consultation, recommended home health PT and rolling walker for ambulation assistance.    Principal Problem: Alzheimer's dementia with behavioral disturbance ([place]) Diagnosis: Principal Problem:   Alzheimer's dementia with behavioral disturbance ([place]) Active Problems:   MDD (major depressive disorder)   Suicidal ideation  Total Time spent with patient: 20 minutes  Past Psychiatric History:  See admission H&P  Past Medical History:  Past Medical History:  Diagnosis Date  . Anxiety   . Arthritis   . Cancer ([place])    uterine  . Depression   . Endometrial polyp 02-17-13   11'14 Dx. endometrial cancer  . Hyperlipidemia   . Hypertension   . Hypothyroidism     Past Surgical History:  Procedure Laterality Date  . APPENDECTOMY    . BREAST SURGERY     biopsy  . CATARACT EXTRACTION, BILATERAL Bilateral 02-17-13   bilateral  . DILITATION & CURRETTAGE/HYSTROSCOPY WITH VERSAPOINT RESECTION N/A 01/31/2013   Procedure: DILATATION & CURETTAGE/HYSTEROSCOPY WITH CERVICAL BLOCK;  Surgeon: Marylynn Pearson, MD;  Location: [facility];  Service: Gynecology;  Laterality: N/A;  . LAPAROSCOPY FOR ECTOPIC PREGNANCY  1977   SALPINGOSTOMY (SIDE UNKNOWN)  . LAPAROTOMY Bilateral 02/18/2013   Procedure: EXPLORATORY LAPAROTOMY TOTAL ABDMONIAL HYSTERECTOMY BILATERAL SALPINGO OOPHORECTOMY ;  Surgeon: Alvino Chapel, MD;  Location: [facility];  Service: Gynecology;  Laterality: Bilateral;  . TOTAL ABDOMINAL HYSTERECTOMY W/ BILATERAL SALPINGOOPHORECTOMY    . TOTAL KNEE ARTHROPLASTY Bilateral    Family History:  Family History  Problem Relation Age of Onset  . Arthritis Mother   . Mental illness Mother   . Diabetes Mother   . Cancer Father        colon, lung, and prostate  . Arthritis Father   . Hyperlipidemia Father   . Stroke Father   . Heart disease Father   . Hypertension Father   . Colitis Father   . Esophageal cancer Neg Hx   . Rectal cancer Neg Hx   . Stomach cancer Neg Hx   . Breast cancer Neg Hx  Family Psychiatric  History: See admission H&P Social History:  Social History   Substance and Sexual Activity  Alcohol Use Yes   Comment: twice monthly; had her 1st glass of wine in 2 year      Social History   Substance and Sexual Activity  Drug Use No    Social History   Socioeconomic History  . Marital status: Widowed    Spouse name: Not on file  . Number of children:  Not on file  . Years of education: Not on file  . Highest education level: Not on file  Occupational History  . Not on file  Tobacco Use  . Smoking status: Former Smoker    Packs/day: 1.50    Years: 47.00    Pack years: 70.50    Quit date: 07/04/2009    Years since quitting: 9.8  . Smokeless tobacco: Never Used  . Tobacco comment: quit a long time ago  Substance and Sexual Activity  . Alcohol use: Yes    Comment: twice monthly; had her 1st glass of wine in 2 year   . Drug use: No  . Sexual activity: Yes    Partners: Male    Birth control/protection: Post-menopausal  Other Topics Concern  . Not on file  Social History Narrative   Retired from being an Web designer   Married for 35 years   3 step children and 1 adopted child. They live all over the [name]   Social Determinants of Health   Financial Resource Strain:   . Difficulty of Paying Living Expenses: Not on file  Food Insecurity:   . Worried About Charity fundraiser in the Last Year: Not on file  . Ran Out of Food in the Last Year: Not on file  Transportation Needs:   . Lack of Transportation (Medical): Not on file  . Lack of Transportation (Non-Medical): Not on file  Physical Activity:   . Days of Exercise per Week: Not on file  . Minutes of Exercise per Session: Not on file  Stress:   . Feeling of Stress : Not on file  Social Connections:   . Frequency of Communication with Friends and Family: Not on file  . Frequency of Social Gatherings with Friends and Family: Not on file  . Attends Religious Services: Not on file  . Active Member of Clubs or Organizations: Not on file  . Attends Archivist Meetings: Not on file  . Marital Status: Not on file   Additional Social History:    Pain Medications: see MAR Prescriptions: see MAR Over the Counter: see MAR History of alcohol / drug use?: No history of alcohol / drug abuse  Sleep: improving  Appetite:  Good  Current Medications: Current  Facility-Administered Medications  Medication Dose Route Frequency Provider Last Rate Last Admin  . acetaminophen (TYLENOL) tablet 650 mg  650 mg Oral Q6H PRN Deloria Lair, NP   650 mg at 05/10/19 1759  . albuterol (VENTOLIN HFA) 108 (90 Base) MCG/ACT inhaler 1 puff  1 puff Inhalation QID Lindell Spar I, NP   1 puff at 05/12/19 1013  . aspirin EC tablet 81 mg  81 mg Oral Daily Trevor Duty A, MD   81 mg at 05/12/19 1010  . atorvastatin (LIPITOR) tablet 20 mg  20 mg Oral q1800 [place] Perra, Myer Peer, MD   20 mg at 05/11/19 1751  . busPIRone (BUSPAR) tablet 10 mg  10 mg Oral BID Sharma Covert, MD  10 mg at 05/12/19 1011  . donepezil (ARICEPT) tablet 10 mg  10 mg Oral QHS Sharma Covert, MD   10 mg at 05/11/19 2148  . FLUoxetine (PROZAC) capsule 30 mg  30 mg Oral Daily Sharma Covert, MD   30 mg at 05/12/19 1011  . fluticasone (FLOVENT HFA) 44 MCG/ACT inhaler 2 puff  2 puff Inhalation BID Sharma Covert, MD   2 puff at 05/12/19 1012  . folic acid (FOLVITE) tablet 1 mg  1 mg Oral Daily Sharma Covert, MD   1 mg at 05/12/19 1010  . guaiFENesin (ROBITUSSIN) 100 MG/5ML solution 300 mg  15 mL Oral Q4H PRN Lindell Spar I, NP   300 mg at 05/04/19 1046  . hydrOXYzine (ATARAX/VISTARIL) tablet 10 mg  10 mg Oral TID PRN Larna Capelle, Myer Peer, MD   10 mg at 05/11/19 2148  . levothyroxine (SYNTHROID) tablet 25 mcg  25 mcg Oral Q0600 Brooke Steinhilber, Myer Peer, MD   25 mcg at 05/12/19 [phone]  . lidocaine (LIDODERM) 5 % 1 patch  1 patch Transdermal Q24H Arlena Marsan, Myer Peer, MD   1 patch at 05/11/19 [phone]  . magnesium hydroxide (MILK OF MAGNESIA) suspension 30 mL  30 mL Oral Daily PRN Dixon, Rashaun M, NP      . memantine (NAMENDA) tablet 5 mg  5 mg Oral BID Sharma Covert, MD   5 mg at 05/12/19 1011  . thiamine tablet 100 mg  100 mg Oral Daily Sharma Covert, MD   100 mg at 05/12/19 1011  . Vitamin D (Ergocalciferol) (DRISDOL) capsule 50,000 Units  50,000 Units Oral Q7 days Sharma Covert, MD    50,000 Units at 05/08/19 1136    Lab Results:  Results for orders placed or performed during the hospital encounter of 04/24/19 (from the past 48 hour(s))  Glucose, capillary     Status: Abnormal   Collection Time: 05/11/19  5:57 AM  Result Value Ref Range   Glucose-Capillary 100 (H) 70 - 99 mg/dL  Glucose, capillary     Status: Abnormal   Collection Time: 05/12/19  5:53 AM  Result Value Ref Range   Glucose-Capillary 102 (H) 70 - 99 mg/dL   Comment 1 Notify RN    Comment 2 Document in Chart     Blood Alcohol level:  No results found for: [facility]  Metabolic Disorder Labs: Lab Results  Component Value Date   HGBA1C 5.9 (H) 04/25/2019   MPG 122.63 04/25/2019   No results found for: PROLACTIN Lab Results  Component Value Date   CHOL 174 04/25/2019   TRIG 119 04/25/2019   HDL 57 04/25/2019   CHOLHDL 3.1 04/25/2019   VLDL 24 04/25/2019   LDLCALC 93 04/25/2019   LDLCALC 92 05/07/2018    Physical Findings: AIMS: Facial and Oral Movements Muscles of Facial Expression: None, normal Lips and Perioral Area: None, normal Jaw: None, normal Tongue: None, normal,Extremity Movements Upper (arms, wrists, hands, fingers): None, normal Lower (legs, knees, ankles, toes): None, normal, Trunk Movements Neck, shoulders, hips: None, normal, Overall Severity Severity of abnormal movements (highest score from questions above): None, normal Incapacitation due to abnormal movements: None, normal Patient's awareness of abnormal movements (rate only patient's report): No Awareness, Dental Status Current problems with teeth and/or dentures?: No Does patient usually wear dentures?: No  CIWA:    COWS:     Musculoskeletal: Strength & Muscle Tone: within normal limits Gait & Station: Slow-currently ambulating with walker Patient leans: N/A  Psychiatric Specialty Exam: Physical Exam  Review of Systems less focused on back pain today and when asked states it is better.  No chest pain or shortness  of breath.  No fever or chills.  Blood pressure 117/69, pulse 74, temperature 98 F (36.7 C), temperature source Oral, resp. rate 18, height '5\' 5"'$  (1.651 m), weight 84.4 kg, SpO2 100 %.Body mass index is 30.95 kg/m.  General Appearance: Fairly groomed  Eye Contact:  Good  Speech:  Normal Rate  Volume:  Normal  Mood:  Reports mood is "okay".  Today appears depressed  Affect:  Affect is variable, briefly tearful when speaking about her husband, brightens considerably when discussing other topics  Thought Process:  Linear and Descriptions of Associations: Intact  Orientation:  Other:  alert and attentive  Thought Content:  no hallucinations, no delusions , Ruminations about remote events associated with her father  Suicidal Thoughts:  Yes.  without intent/plan today make statement she would rather have died then been widowed.  She denies any suicidal plan or intention and contracts for safety on unit.  Homicidal Thoughts:  No  Memory:  immediate preserved, recent poor   Judgement: Fair  Insight:  Fair  Psychomotor Activity:  Normal- no restlessness or agitation  Concentration:  Concentration: Good and Attention Span: Good  Recall:  Poor  Fund of Knowledge:  Fair  Language:  Good  Akathisia:  No  Handed:  Right  AIMS (if indicated):     Assets:  Communication Skills Desire for Improvement Financial Resources/Insurance Leisure Time  ADL's:  Intact  Cognition:  WNL  Sleep:  Number of Hours: 6.5   Assessment -  74 year old widowed female (husband passed away 3 years ago), lives alone, has 1 adult son with whom she has limited contact, on Fish farm manager. Presented for worsening depression, suicidal ideations. With recent thoughts of wanting to traffic or stabbing self, neurovegetative symptoms,social isolation and decreased self-care. She expressed concern regarding memory loss over recent months and states that she recently had an episode where she got lost while going to a  supermarket.  At this time patient presents alert, attentive, pleasant on approach.  Today appears depressed initially, ruminating about her husband's passing and her feeling lonely without him.  Made statement that she would have rather died than widowed/lost him.  She responds partially to support/validation but affect does improve significantly when topic of conversation redirected to something else .  She remains alert, attentive and oriented x3 and although forgetful,she appears to remember that current plan is for her to go to an assisted living facility at discharge.  As reviewed with CSW, has been accepted to [facility] but date of acceptance/admission still not defined  Treatment Plan Summary: Daily contact with patient to assess and evaluate symptoms and progress in treatment and Medication management  Treatment Plan reviewed as below today 2/22 Encourage group and milieu participation Continue discharge planning -awaiting final acceptance to [facility]  Continue Aricept 10 mg PO QHS for Alzheimer's dementia Continue Namenda 5 mg PO BID For Alzheimer's dementia Continue Prozac 30 mg PO daily for depression/anxiety Continue Buspar 10 mg PO BID for anxiety Continue ASA 81 mg PO daily for antiplatelet Continue Lipitor 20 mg PO daily for HLD Continue Albuterol inhaler 1-2 puffs QID for asthma Continue Flovent inhaler 2 puffs BID for asthma Continue folic acid 1 mg PO daily for supplementation Continue thiamine 100 mg PO daily for supplementation Continue vitamin D 50,000 units PO Q7days for supplementation  Continue B12 1000 mg IM Q week for B12 supplementation Continue Synthroid 25 mcg PO daily for hypothyroidism ContinueVistaril 10 mgrs TID PRN for anxiety Recheck CBC in a.m. to monitor WBC  Jenne Campus, MD 05/12/2019, 3:02 PM    Patient ID: Lester Kinsman, female   DOB: 26-Jul-1945, 74 y.o.   MRN: [id number]

## 2019-05-12 NOTE — Progress Notes (Signed)
Recreation Therapy Notes  Date:  2.22.21 Time: 0930 Location: [address] Group Room  Group Topic: Stress Management  Goal Area(s) Addresses:  Patient will identify positive stress management techniques. Patient will identify benefits of using stress management post d/c.  Intervention: Stress Management  Activity : Meditation.  LRT played a meditation that focused on making the most of your day.  Patients were to listen and follow along as the meditation played to engage in the activity.    Education:  Stress Management, Discharge Planning.   Education Outcome: Acknowledges Education  Clinical Observations/Feedback: Pt did not attend group activity.    Victorino Sparrow, LRT/CTRS         Victorino Sparrow A 05/12/2019 11:37 AM

## 2019-05-12 NOTE — Evaluation (Signed)
Physical Therapy Evaluation Patient Details Name: Kara Huynh MRN: [id number] DOB: 12-13-1945 Today's Date: 05/12/2019   History of Present Illness  74 year old female was admitted to [facility] with MDD and SI, diagnosed with Alzheimer's dementia with behavioral disturbance .  PMH:  endometrial CA, anxiety, depression, HTN and probable dementia  Clinical Impression  Pt admitted to [facility] with above diagnosis.  Pt currently with functional limitations due to the deficits listed below (see PT Problem List). Pt will benefit from skilled PT to increase their independence and safety with mobility to allow discharge to the venue listed below.  Pt previous evaluated by PT 05/10/19 and ambulating independently.  Pt requiring RW and reports back pain limiting mobility today.  Pt initially said, "Oh yes, I need physical therapy." However when asking pt to mobilize/participate, pt states she "can't do that!" due to back pain and stating she is not receiving any pain meds.  Pt only agreeable to ambulate back to room.  Recommend pt keep using RW for safety as staff report she almost fell once today.  Also recommended pt use pillows in-between LEs for sleeping (side sleeper) to assist with back pain.  Pt not willing to perform any other activities at this time.  Pt also reports "I just want to die" and "I don't know how I let myself get like this."  Behavior/cognition limiting her ability to participate.       Follow Up Recommendations Home health PT    Equipment Recommendations  Rolling walker with 5" wheels    Recommendations for Other Services       Precautions / Restrictions Precautions Precautions: Fall      Mobility  Bed Mobility               General bed mobility comments: on arrival, pt pulling RW into day room (pen leaked all over her hands and walker)  Transfers Overall transfer level: Needs assistance Equipment used: Rolling walker (2 wheeled) Transfers: Sit to/from Stand Sit to Stand:  Supervision            Ambulation/Gait Ambulation/Gait assistance: Supervision Gait Distance (Feet): 100 Feet Assistive device: Rolling walker (2 wheeled) Gait Pattern/deviations: Step-through pattern;Decreased stride length     General Gait Details: poor heel strike and decreased coordination of LEs, utilized RW for support and due to back pain  Stairs            Wheelchair Mobility    Modified Rankin (Stroke Patients Only)       Balance Overall balance assessment: Mild deficits observed, not formally tested                                           Pertinent Vitals/Pain Pain Assessment: Faces Faces Pain Scale: Hurts even more Pain Location: back pain Pain Descriptors / Indicators: Sore Pain Intervention(s): Repositioned;Monitored during session    Home Living Family/patient expects to be discharged to:: Private residence Living Arrangements: Alone   Type of Home: House Home Access: Stairs to enter       Home Equipment: None      Prior Function Level of Independence: Independent         Comments: walks without assistive device     Hand Dominance        Extremity/Trunk Assessment        Lower Extremity Assessment Lower Extremity Assessment: Generalized weakness(poor coordiation  observed)       Communication   Communication: No difficulties  Cognition Arousal/Alertness: Awake/alert Behavior During Therapy: WFL for tasks assessed/performed Overall Cognitive Status: No family/caregiver present to determine baseline cognitive functioning Area of Impairment: Memory                     Memory: Decreased short-term memory         General Comments: pt reports issues with memory; pt also reluctant to participate and appears limited by cognitive/behavioral status (states, "I just want to die."  "When/if I leave here, then I just go to another form of prison; home alone."      General Comments       Exercises     Assessment/Plan    PT Assessment Patient needs continued PT services  PT Problem List Decreased balance;Decreased knowledge of use of DME;Decreased strength;Decreased activity tolerance;Decreased mobility       PT Treatment Interventions Gait training;DME instruction;Therapeutic exercise;Balance training;Therapeutic activities;Patient/family education;Functional mobility training    PT Goals (Current goals can be found in the Care Plan section)  Acute Rehab PT Goals PT Goal Formulation: With patient Time For Goal Achievement: 05/26/19 Potential to Achieve Goals: Good    Frequency Min 1X/week   Barriers to discharge        Co-evaluation               AM-PAC PT "6 Clicks" Mobility  Outcome Measure Help needed turning from your back to your side while in a flat bed without using bedrails?: None Help needed moving from lying on your back to sitting on the side of a flat bed without using bedrails?: None Help needed moving to and from a bed to a chair (including a wheelchair)?: None Help needed standing up from a chair using your arms (e.g., wheelchair or bedside chair)?: None Help needed to walk in hospital room?: A Little Help needed climbing 3-5 steps with a railing? : A Little 6 Click Score: 22    End of Session   Activity Tolerance: Other (comment)(pt self limited participation) Patient left: in bed   PT Visit Diagnosis: Difficulty in walking, not elsewhere classified (R26.2);Muscle weakness (generalized) (M62.81)    Time: [id number] PT Time Calculation (min) (ACUTE ONLY): 13 min   Charges:   PT Evaluation $PT Re-evaluation: 1 Re-eval     Jannette Spanner PT, DPT Acute Rehabilitation Services Office: [phone] [place] Ram E 05/12/2019, 2:54 PM

## 2019-05-12 NOTE — BHH Counselor (Signed)
CSW spoke with staff at [facility], who confirm there is still a bed offered and available for this patient. Staff requested documentation to be refaxed the following documentation [phone]) per request: Facesheet, H&P, FL-2, progress note with TB result, med list, and RSVP number [phone].  Patient was granted an RSVP number NOT a PASRR number.  [facility] staff reports that admissions coordinator, Joycelyn Schmid, will be in office tomorrow to review documentation for patient and confirm final acceptance and admission date. CSW inquired if patient could be admitted as soon as Wednesday, 02/24. Staff was unable to confirm, but confirmed that there is an available bed now.  CSW following for disposition.  Stephanie Acre, MSW, [place] Social Worker [facility]  [phone]

## 2019-05-13 LAB — CBC WITH DIFFERENTIAL/PLATELET
Abs Immature Granulocytes: 0.01 10*3/uL (ref 0.00–0.07)
Basophils Absolute: 0 10*3/uL (ref 0.0–0.1)
Basophils Relative: 0 %
Eosinophils Absolute: 0 10*3/uL (ref 0.0–0.5)
Eosinophils Relative: 1 %
HCT: 38.1 % (ref 36.0–46.0)
Hemoglobin: 12.1 g/dL (ref 12.0–15.0)
Immature Granulocytes: 0 %
Lymphocytes Relative: 19 %
Lymphs Abs: 0.9 10*3/uL (ref 0.7–4.0)
MCH: 29.6 pg (ref 26.0–34.0)
MCHC: 31.8 g/dL (ref 30.0–36.0)
MCV: 93.2 fL (ref 80.0–100.0)
Monocytes Absolute: 0.2 10*3/uL (ref 0.1–1.0)
Monocytes Relative: 3 %
Neutro Abs: 3.5 10*3/uL (ref 1.7–7.7)
Neutrophils Relative %: 77 %
Platelets: 209 10*3/uL (ref 150–400)
RBC: 4.09 MIL/uL (ref 3.87–5.11)
RDW: 13.9 % (ref 11.5–15.5)
WBC: 4.6 10*3/uL (ref 4.0–10.5)
nRBC: 0 % (ref 0.0–0.2)

## 2019-05-13 NOTE — Progress Notes (Signed)
D:  Patient denied SI and HI, contracts for safety.  Denied A/V hallucinations.  Denied pain. A:  Medications administered per MD orders.  Emotional support and encouragement given patient. R:  Safety maintained with 15 minute checks.  

## 2019-05-13 NOTE — BHH Counselor (Addendum)
CSW provided patient with [facility] intake packet and assisted patient in completing demographic information for the paperwork. Patient wished for the remainder of the packet to be left with her, to allow time to read and reference the 40 page packet. Patient was also provided her reading glasses.  Randi, Jaques, [phone]) updated of disposition.   Stephanie Acre, MSW, [place] Social Worker [facility] Unit  [phone]

## 2019-05-13 NOTE — Plan of Care (Signed)
Nurse discussed anxiety, depression and coping skills with patient.  

## 2019-05-13 NOTE — Progress Notes (Signed)
[facility] MD Progress Note  05/13/2019 11:36 AM Kara Huynh  MRN:  [id number] Subjective:  Patient is a 74 year old female with a probable past psychiatric history significant for major depression, generalized anxiety disorder, probable dementia of unspecified origin (most likely Alzheimer's) who was admitted on 04/24/2019 secondary to suicidal ideation.  Objective: Patient is seen and examined.  Patient is a 74 year old female with a past psychiatric history significant for probable Alzheimer's dementia.  She is seen in follow-up.  Patient is familiar to me since she has been in the hospital for almost 18 days now.  She is essentially unchanged.  Her mental status is still that of dementia.  Today she believes she is 74 years old, she is able to recall the presence Biden and Trump.  She is unable to do serial sevens, and does serial fours with difficulty.  We discussed the fact that she has been accepted to an assisted living facility or an Alzheimer's type unit.  She stated that she believes she is only been here for for 5 days.  She stated that she is not had that much attention since she has been here.  I reminded her that she had been here 18 days.  She thought she was here because of her back pain and wanted someone to treat her back pain, but I told her she had been admitted to the hospital because she went to the emergency room and said she was suicidal.  Her vital signs are stable, she is afebrile.  She slept 4.75 hours last night.  Laboratory from 2/19 revealed an elevated creatinine at 1.35.  This is the same it was 2 weeks ago.  Her CBC was repeated and was normal except for a mildly low white blood cell count at 3.0.  Principal Problem: Alzheimer's dementia with behavioral disturbance ([place]) Diagnosis: Principal Problem:   Alzheimer's dementia with behavioral disturbance ([place]) Active Problems:   MDD (major depressive disorder)   Suicidal ideation  Total Time spent with patient: 20  minutes  Past Psychiatric History: See admission H&P  Past Medical History:  Past Medical History:  Diagnosis Date  . Anxiety   . Arthritis   . Cancer ([place])    uterine  . Depression   . Endometrial polyp 02-17-13   11'14 Dx. endometrial cancer  . Hyperlipidemia   . Hypertension   . Hypothyroidism     Past Surgical History:  Procedure Laterality Date  . APPENDECTOMY    . BREAST SURGERY     biopsy  . CATARACT EXTRACTION, BILATERAL Bilateral 02-17-13   bilateral  . DILITATION & CURRETTAGE/HYSTROSCOPY WITH VERSAPOINT RESECTION N/A 01/31/2013   Procedure: DILATATION & CURETTAGE/HYSTEROSCOPY WITH CERVICAL BLOCK;  Surgeon: Marylynn Pearson, MD;  Location: [facility];  Service: Gynecology;  Laterality: N/A;  . LAPAROSCOPY FOR ECTOPIC PREGNANCY  1977   SALPINGOSTOMY (SIDE UNKNOWN)  . LAPAROTOMY Bilateral 02/18/2013   Procedure: EXPLORATORY LAPAROTOMY TOTAL ABDMONIAL HYSTERECTOMY BILATERAL SALPINGO OOPHORECTOMY ;  Surgeon: Alvino Chapel, MD;  Location: [facility];  Service: Gynecology;  Laterality: Bilateral;  . TOTAL ABDOMINAL HYSTERECTOMY W/ BILATERAL SALPINGOOPHORECTOMY    . TOTAL KNEE ARTHROPLASTY Bilateral    Family History:  Family History  Problem Relation Age of Onset  . Arthritis Mother   . Mental illness Mother   . Diabetes Mother   . Cancer Father        colon, lung, and prostate  . Arthritis Father   . Hyperlipidemia Father   . Stroke Father   . Heart  disease Father   . Hypertension Father   . Colitis Father   . Esophageal cancer Neg Hx   . Rectal cancer Neg Hx   . Stomach cancer Neg Hx   . Breast cancer Neg Hx    Family Psychiatric  History: See admission H&P Social History:  Social History   Substance and Sexual Activity  Alcohol Use Yes   Comment: twice monthly; had her 1st glass of wine in 2 year      Social History   Substance and Sexual Activity  Drug Use No    Social History   Socioeconomic History  . Marital status: Widowed    Spouse name:  Not on file  . Number of children: Not on file  . Years of education: Not on file  . Highest education level: Not on file  Occupational History  . Not on file  Tobacco Use  . Smoking status: Former Smoker    Packs/day: 1.50    Years: 47.00    Pack years: 70.50    Quit date: 07/04/2009    Years since quitting: 9.8  . Smokeless tobacco: Never Used  . Tobacco comment: quit a long time ago  Substance and Sexual Activity  . Alcohol use: Yes    Comment: twice monthly; had her 1st glass of wine in 2 year   . Drug use: No  . Sexual activity: Yes    Partners: Male    Birth control/protection: Post-menopausal  Other Topics Concern  . Not on file  Social History Narrative   Retired from being an Web designer   Married for 35 years   3 step children and 1 adopted child. They live all over the [name]   Social Determinants of Health   Financial Resource Strain:   . Difficulty of Paying Living Expenses: Not on file  Food Insecurity:   . Worried About Charity fundraiser in the Last Year: Not on file  . Ran Out of Food in the Last Year: Not on file  Transportation Needs:   . Lack of Transportation (Medical): Not on file  . Lack of Transportation (Non-Medical): Not on file  Physical Activity:   . Days of Exercise per Week: Not on file  . Minutes of Exercise per Session: Not on file  Stress:   . Feeling of Stress : Not on file  Social Connections:   . Frequency of Communication with Friends and Family: Not on file  . Frequency of Social Gatherings with Friends and Family: Not on file  . Attends Religious Services: Not on file  . Active Member of Clubs or Organizations: Not on file  . Attends Archivist Meetings: Not on file  . Marital Status: Not on file   Additional Social History:    Pain Medications: see MAR Prescriptions: see MAR Over the Counter: see MAR History of alcohol / drug use?: No history of alcohol / drug abuse                    Sleep:  Fair  Appetite:  Good  Current Medications: Current Facility-Administered Medications  Medication Dose Route Frequency Provider Last Rate Last Admin  . acetaminophen (TYLENOL) tablet 650 mg  650 mg Oral Q6H PRN Deloria Lair, NP   650 mg at 05/10/19 1759  . albuterol (VENTOLIN HFA) 108 (90 Base) MCG/ACT inhaler 1 puff  1 puff Inhalation QID Lindell Spar I, NP   1 puff at 05/13/19 0759  . aspirin  EC tablet 81 mg  81 mg Oral Daily Cobos, Myer Peer, MD   81 mg at 05/13/19 0759  . atorvastatin (LIPITOR) tablet 20 mg  20 mg Oral q1800 Cobos, Myer Peer, MD   20 mg at 05/12/19 1826  . busPIRone (BUSPAR) tablet 10 mg  10 mg Oral BID Sharma Covert, MD   10 mg at 05/13/19 0759  . donepezil (ARICEPT) tablet 10 mg  10 mg Oral QHS Sharma Covert, MD   10 mg at 05/12/19 2131  . FLUoxetine (PROZAC) capsule 30 mg  30 mg Oral Daily Sharma Covert, MD   30 mg at 05/13/19 0800  . fluticasone (FLOVENT HFA) 44 MCG/ACT inhaler 2 puff  2 puff Inhalation BID Sharma Covert, MD   2 puff at 05/13/19 0759  . folic acid (FOLVITE) tablet 1 mg  1 mg Oral Daily Sharma Covert, MD   1 mg at 05/13/19 0800  . guaiFENesin (ROBITUSSIN) 100 MG/5ML solution 300 mg  15 mL Oral Q4H PRN Lindell Spar I, NP   300 mg at 05/04/19 1046  . hydrOXYzine (ATARAX/VISTARIL) tablet 10 mg  10 mg Oral TID PRN Cobos, Myer Peer, MD   10 mg at 05/12/19 2130  . levothyroxine (SYNTHROID) tablet 25 mcg  25 mcg Oral Q0600 Cobos, Myer Peer, MD   25 mcg at 05/13/19 0801  . lidocaine (LIDODERM) 5 % 1 patch  1 patch Transdermal Q24H Cobos, Myer Peer, MD   1 patch at 05/12/19 2200  . magnesium hydroxide (MILK OF MAGNESIA) suspension 30 mL  30 mL Oral Daily PRN Dixon, Rashaun M, NP      . memantine (NAMENDA) tablet 5 mg  5 mg Oral BID Sharma Covert, MD   5 mg at 05/13/19 0800  . thiamine tablet 100 mg  100 mg Oral Daily Sharma Covert, MD   100 mg at 05/13/19 0801  . Vitamin D (Ergocalciferol) (DRISDOL) capsule 50,000  Units  50,000 Units Oral Q7 days Sharma Covert, MD   50,000 Units at 05/08/19 1136    Lab Results:  Results for orders placed or performed during the hospital encounter of 04/24/19 (from the past 48 hour(s))  Glucose, capillary     Status: Abnormal   Collection Time: 05/12/19  5:53 AM  Result Value Ref Range   Glucose-Capillary 102 (H) 70 - 99 mg/dL   Comment 1 Notify RN    Comment 2 Document in Chart     Blood Alcohol level:  No results found for: [facility]  Metabolic Disorder Labs: Lab Results  Component Value Date   HGBA1C 5.9 (H) 04/25/2019   MPG 122.63 04/25/2019   No results found for: PROLACTIN Lab Results  Component Value Date   CHOL 174 04/25/2019   TRIG 119 04/25/2019   HDL 57 04/25/2019   CHOLHDL 3.1 04/25/2019   VLDL 24 04/25/2019   LDLCALC 93 04/25/2019   LDLCALC 92 05/07/2018    Physical Findings: AIMS: Facial and Oral Movements Muscles of Facial Expression: None, normal Lips and Perioral Area: None, normal Jaw: None, normal Tongue: None, normal,Extremity Movements Upper (arms, wrists, hands, fingers): None, normal Lower (legs, knees, ankles, toes): None, normal, Trunk Movements Neck, shoulders, hips: None, normal, Overall Severity Severity of abnormal movements (highest score from questions above): None, normal Incapacitation due to abnormal movements: None, normal Patient's awareness of abnormal movements (rate only patient's report): No Awareness, Dental Status Current problems with teeth and/or dentures?: No Does patient usually wear dentures?:  No  CIWA:    COWS:     Musculoskeletal: Strength & Muscle Tone: within normal limits Gait & Station: shuffle Patient leans: N/A  Psychiatric Specialty Exam: Physical Exam  Nursing note and vitals reviewed. Constitutional: She is oriented to person, place, and time. She appears well-developed and well-nourished.  HENT:  Head: Normocephalic and atraumatic.  Respiratory: Effort normal.  Neurological:  She is alert and oriented to person, place, and time.    Review of Systems  Blood pressure 117/69, pulse 74, temperature 98 F (36.7 C), temperature source Oral, resp. rate 18, height 5\' 5"  (1.651 m), weight 84.4 kg, SpO2 100 %.Body mass index is 30.95 kg/m.  General Appearance: Casual  Eye Contact:  Fair  Speech:  Normal Rate  Volume:  Normal  Mood:  Anxious  Affect:  Congruent  Thought Process:  Goal Directed and Descriptions of Associations: Circumstantial  Orientation:  Other:  Oriented to hospital and self only.  Thought Content:  Rumination  Suicidal Thoughts:  No  Homicidal Thoughts:  No  Memory:  Immediate;   Poor Recent;   Poor Remote;   Poor  Judgement:  Impaired  Insight:  Fair  Psychomotor Activity:  Normal  Concentration:  Concentration: Fair and Attention Span: Fair  Recall:  [employer] of Knowledge:  Fair  Language:  Good  Akathisia:  Negative  Handed:  Right  AIMS (if indicated):     Assets:  Desire for Improvement Resilience  ADL's:  Intact  Cognition:  Impaired,  Moderate  Sleep:  Number of Hours: 4.75     Treatment Plan Summary: Daily contact with patient to assess and evaluate symptoms and progress in treatment, Medication management and Plan : Patient is seen and examined.  Patient is a 74 year old female with the above-stated past psychiatric history who is seen in follow-up.  Diagnosis: #1 dementia with behavioral disturbance thought to be most probably Alzheimer's, #2 unspecified depression versus major depression, #3 generalized anxiety disorder, #4 type 2 diabetes mellitus well controlled, #5 hypothyroidism, #6 hyperlipidemia, #7 essential hypertension  Patient is seen in follow-up.  She is essentially unchanged from when I last saw her.  She will be going to a facility tomorrow to live that.  She is not sleeping well, and really only on donepezil and memantine in the evening.  Given her discharge tomorrow I feel very uncomfortable starting  anything new for sleep.  No changes in her medications today, I will order repeat TSH in case with compliance over the last 3 weeks it has gotten lower than it was on admission and influencing other issues.  1.  Decrease ventilator HFA to 1 puff 3 times daily in case the fourth dose in the evening is causing problems with sleep secondary to agitation. 2.  Continue a coated aspirin 81 mg p.o. daily for heart health. 3.  Continue BuSpar 10 mg p.o. twice daily for anxiety. 4.  Continue Aricept 10 mg p.o. nightly for dementia. 5.  Continue fluoxetine 30 mg p.o. daily for depression and anxiety. 6.  Continue Flovent 2 puffs twice daily for COPD. 7.  Continue folic acid 1 mg p.o. daily for nutritional supplementation. 8.  Continue Robitussin 15 cc every 4 hours as needed cough. 9.  Continue hydroxyzine 10 mg p.o. 3 times daily as needed anxiety. 10.  Continue Synthroid 25 mcg p.o. daily for hypothyroidism. 11.  Continue lidocaine 5% patch to back every 24 hours for pain. 12.  Continue memantine 5 mg p.o. twice daily for  dementia. 13.  Continue thiamine 100 mg p.o. daily for nutritional supplementation. 14.  Continue Drisdol 50,000 units p.o. q. 7 days for vitamin D deficiency. 15.  Repeat TSH. 16.  Disposition planning-hopefully to assisted living/memory care in a.m. tomorrow.  Sharma Covert, MD 05/13/2019, 11:36 AM

## 2019-05-13 NOTE — Progress Notes (Signed)
Recreation Therapy Notes  Animal-Assisted Activity (AAA) Program Checklist/Progress Notes Patient Eligibility Criteria Checklist & Daily Group note for Rec Tx Intervention  Date: 2.23.21 Time: 61 Location: [address]   AAA/T Program Assumption of Risk Form signed by Teacher, music or Parent Legal Guardian  YES   Patient is free of allergies or sever asthma YES   Patient reports no fear of animals  YES   Patient reports no history of cruelty to animals  YES   Patient understands his/her participation is voluntary  YES  Patient washes hands before animal contact  YES    Patient washes hands after animal contact YES   Education: Contractor, Appropriate Animal Interaction   Education Outcome: Acknowledges understanding/In group clarification offered/Needs additional education.   Clinical Observations/Feedback:  Pt did not attend group activity.   Victorino Sparrow, LRT/CTRS         Victorino Sparrow A 05/13/2019 3:50 PM

## 2019-05-13 NOTE — Progress Notes (Signed)
   05/13/19 2000  Psych Admission Type (Psych Patients Only)  Admission Status Voluntary  Psychosocial Assessment  Patient Complaints Anxiety;Worrying  Eye Contact Fair  Facial Expression Anxious  Affect Appropriate to circumstance  Speech Logical/coherent  Interaction Assertive  Motor Activity Slow;Unsteady (utilizes walker)  Appearance/Hygiene Unremarkable  Behavior Characteristics Cooperative  Mood Anxious  Thought Process  Coherency WDL  Content WDL  Delusions None reported or observed  Perception Derealization  Hallucination None reported or observed  Judgment Poor  Confusion Moderate  Danger to Self  Current suicidal ideation? Denies  Self-Injurious Behavior No self-injurious ideation or behavior indicators observed or expressed   Agreement Not to Harm Self Yes  Description of Agreement verbal  Danger to Others  Danger to Others None reported or observed

## 2019-05-13 NOTE — Progress Notes (Signed)
Patient did not attend wrap up group. 

## 2019-05-13 NOTE — BHH Counselor (Signed)
CSW spoke with Joycelyn Schmid [phone]), admissions coordinator at [facility] in [place]. Blondie confirms receipt of documentation yesterday, a final review is currently underway with the Retail buyer.  Blondie informed CSW that this patient can move in tomorrow (02/24). As patient is still her own legal guardian, patient can sign herself in. Blondie will fax paperwork to [place] office for patient to start prior to move.  Psychiatry and patient informed.  Stephanie Acre, MSW, [place] Social Worker [facility] Adult Unit  [phone]

## 2019-05-14 DIAGNOSIS — F411 Generalized anxiety disorder: Secondary | ICD-10-CM

## 2019-05-14 LAB — TSH: TSH: 1.662 u[IU]/mL (ref 0.350–4.500)

## 2019-05-14 MED ORDER — LIDOCAINE 5 % EX PTCH: 1.0000 | MEDICATED_PATCH | CUTANEOUS | 0 refills | Status: AC

## 2019-05-14 MED ORDER — ALBUTEROL SULFATE HFA 108 (90 BASE) MCG/ACT IN AERS: 1.0000 | INHALATION_SPRAY | Freq: Four times a day (QID) | RESPIRATORY_TRACT | Status: AC

## 2019-05-14 MED ORDER — FLUTICASONE PROPIONATE HFA 44 MCG/ACT IN AERO: 2.0000 | INHALATION_SPRAY | Freq: Two times a day (BID) | RESPIRATORY_TRACT | 0 refills | Status: AC

## 2019-05-14 MED ORDER — ATORVASTATIN CALCIUM 20 MG PO TABS: 20.0000 mg | ORAL_TABLET | Freq: Every day | ORAL | 0 refills | Status: DC

## 2019-05-14 MED ORDER — HYDROXYZINE HCL 10 MG PO TABS: 10.0000 mg | ORAL_TABLET | Freq: Three times a day (TID) | ORAL | 0 refills | Status: DC | PRN

## 2019-05-14 MED ORDER — FOLIC ACID 1 MG PO TABS: 1.0000 mg | ORAL_TABLET | Freq: Every day | ORAL | 0 refills | Status: AC

## 2019-05-14 MED ORDER — DONEPEZIL HCL 10 MG PO TABS: 10.0000 mg | ORAL_TABLET | Freq: Every day | ORAL | 0 refills | Status: DC

## 2019-05-14 MED ORDER — THIAMINE HCL 100 MG PO TABS: 100.0000 mg | ORAL_TABLET | Freq: Every day | ORAL | 0 refills | Status: AC

## 2019-05-14 MED ORDER — MEMANTINE HCL 5 MG PO TABS: 5.0000 mg | ORAL_TABLET | Freq: Two times a day (BID) | ORAL | 0 refills | Status: DC

## 2019-05-14 MED ORDER — VITAMIN D (ERGOCALCIFEROL) 1.25 MG (50000 UNIT) PO CAPS: 50000.0000 [IU] | ORAL_CAPSULE | ORAL | 0 refills | Status: AC

## 2019-05-14 MED ORDER — ASPIRIN EC 81 MG PO TBEC: 81.0000 mg | DELAYED_RELEASE_TABLET | Freq: Every day | ORAL | 0 refills | Status: AC

## 2019-05-14 MED ORDER — BUSPIRONE HCL 10 MG PO TABS: 10.0000 mg | ORAL_TABLET | Freq: Two times a day (BID) | ORAL | 0 refills | Status: DC

## 2019-05-14 MED ORDER — LISINOPRIL 5 MG PO TABS: 5.0000 mg | ORAL_TABLET | Freq: Every day | ORAL | 0 refills | Status: DC

## 2019-05-14 MED ORDER — FLUOXETINE HCL 10 MG PO CAPS: 30.0000 mg | ORAL_CAPSULE | Freq: Every day | ORAL | 0 refills | Status: DC

## 2019-05-14 MED ORDER — LEVOTHYROXINE SODIUM 25 MCG PO TABS: 25.0000 ug | ORAL_TABLET | Freq: Every day | ORAL | 0 refills | Status: DC

## 2019-05-14 NOTE — Discharge Summary (Addendum)
Physician Discharge Summary Note  Patient:  Kara Huynh is an 74 y.o., female MRN:  [id number] DOB:  [birth year] Patient phone:  [phone] (home)  Patient address:   [address] [place] [address],   Total Time spent with patient: Greater than 30 minutes  Date of Admission:  04/24/2019  Date of Discharge: 05-13-18  Reason for Admission: Worsening symptoms of depression, erratic behaviors & memory problems.  Principal Problem: Alzheimer's dementia with behavioral disturbance [facility])  Discharge Diagnoses: Principal Problem:   Alzheimer's dementia with behavioral disturbance ([place]) Active Problems:   MDD (major depressive disorder)   Suicidal ideation   Generalized anxiety disorder  Past Psychiatric History: Major depressive disorder, Alzheimer's dementia.  Past Medical History:  Past Medical History:  Diagnosis Date  . Anxiety   . Arthritis   . Cancer ([place])    uterine  . Depression   . Endometrial polyp 02-17-13   11'14 Dx. endometrial cancer  . Hyperlipidemia   . Hypertension   . Hypothyroidism     Past Surgical History:  Procedure Laterality Date  . APPENDECTOMY    . BREAST SURGERY     biopsy  . CATARACT EXTRACTION, BILATERAL Bilateral 02-17-13   bilateral  . DILITATION & CURRETTAGE/HYSTROSCOPY WITH VERSAPOINT RESECTION N/A 01/31/2013   Procedure: DILATATION & CURETTAGE/HYSTEROSCOPY WITH CERVICAL BLOCK;  Surgeon: Marylynn Pearson, MD;  Location: [facility];  Service: Gynecology;  Laterality: N/A;  . LAPAROSCOPY FOR ECTOPIC PREGNANCY  1977   SALPINGOSTOMY (SIDE UNKNOWN)  . LAPAROTOMY Bilateral 02/18/2013   Procedure: EXPLORATORY LAPAROTOMY TOTAL ABDMONIAL HYSTERECTOMY BILATERAL SALPINGO OOPHORECTOMY ;  Surgeon: Alvino Chapel, MD;  Location: [facility];  Service: Gynecology;  Laterality: Bilateral;  . TOTAL ABDOMINAL HYSTERECTOMY W/ BILATERAL SALPINGOOPHORECTOMY    . TOTAL KNEE ARTHROPLASTY Bilateral    Family History:  Family History  Problem Relation  Age of Onset  . Arthritis Mother   . Mental illness Mother   . Diabetes Mother   . Cancer Father        colon, lung, and prostate  . Arthritis Father   . Hyperlipidemia Father   . Stroke Father   . Heart disease Father   . Hypertension Father   . Colitis Father   . Esophageal cancer Neg Hx   . Rectal cancer Neg Hx   . Stomach cancer Neg Hx   . Breast cancer Neg Hx    Family Psychiatric  History: See H&P  Social History:  Social History   Substance and Sexual Activity  Alcohol Use Yes   Comment: twice monthly; had her 1st glass of wine in 2 year      Social History   Substance and Sexual Activity  Drug Use No    Social History   Socioeconomic History  . Marital status: Widowed    Spouse name: Not on file  . Number of children: Not on file  . Years of education: Not on file  . Highest education level: Not on file  Occupational History  . Not on file  Tobacco Use  . Smoking status: Former Smoker    Packs/day: 1.50    Years: 47.00    Pack years: 70.50    Quit date: 07/04/2009    Years since quitting: 9.8  . Smokeless tobacco: Never Used  . Tobacco comment: quit a long time ago  Substance and Sexual Activity  . Alcohol use: Yes    Comment: twice monthly; had her 1st glass of wine in 2 year   . Drug  use: No  . Sexual activity: Yes    Partners: Male    Birth control/protection: Post-menopausal  Other Topics Concern  . Not on file  Social History Narrative   Retired from being an Web designer   Married for 35 years   3 step children and 1 adopted child. They live all over the [name]   Social Determinants of Health   Financial Resource Strain:   . Difficulty of Paying Living Expenses: Not on file  Food Insecurity:   . Worried About Charity fundraiser in the Last Year: Not on file  . Ran Out of Food in the Last Year: Not on file  Transportation Needs:   . Lack of Transportation (Medical): Not on file  . Lack of Transportation (Non-Medical): Not  on file  Physical Activity:   . Days of Exercise per Week: Not on file  . Minutes of Exercise per Session: Not on file  Stress:   . Feeling of Stress : Not on file  Social Connections:   . Frequency of Communication with Friends and Family: Not on file  . Frequency of Social Gatherings with Friends and Family: Not on file  . Attends Religious Services: Not on file  . Active Member of Clubs or Organizations: Not on file  . Attends Archivist Meetings: Not on file  . Marital Status: Not on file   Hospital Course: (Per Md's admission evaluation): 74 year old-year-old female, widowed, lives alone, on Social Security benefits.  [facility] reporting worsening depression, neurovegetative symptoms to include sense of anhedonia, erratic sleep and appetite.  Also describes suicidal ideations with thoughts of walking into traffic or cutting herself.  She states she has become more socially isolated, neglecting her self-care (for example staying in bed for long period of time, missing doctors appointments). In addition to neurovegetative symptoms as described above, also endorses significant memory loss over recent months. States she has become more forgetful and describes an episode of getting lost when going to a supermarket. She reports she had been depressed after her husband's death three years ago (from leukemia), but states "I was doing alright until my brother died last year". She reports that her brother and nephew both passed away last year from complications of COVID.  After the above admission evaluation & identification of her presenting symptoms, Kara Huynh was recommended for mood stabilization treatments. The medication regimen for her presenting symptoms were discussed & with her consent initiated. She received, stabilized & was discharged on the medications as listed below on her discharge medication lists. She was also enrolled & participated actively in the group counseling  sessions being offered & held on this unit. She learned coping skills. However, Kara Huynh's hospitalization days passed by, it was noted that she was exhibiting problems. She was unable to remember both recent & or past events. Her memories were fading away & she understood what was happening to her. She did show concerns & was worried about it. She was then started on Namenda & Aricept. She presented on this admission, other pre-existing medical conditions that required treatment & monitoring. She was resumed/discharged on her pertinent home medications for those health issues. She tolerated her treatment regimen without any adverse effects or reactions reported. Part of Kara Huynh's discharged plan was a recommendation & a discharged to an [place] as she is found to be incapable of living alone due to her memory problems.   During the course of her hospitalization, the 15-minute checks were  adequate to ensure Kara Huynh's safety. Patient did not display any dangerous, violent or suicidal behavior on the unit.  She interacted with patients & staff appropriately, participated appropriately in the group sessions/therapies. Her medications were addressed & adjusted to meet her needs. She was recommended for outpatient follow-up care & medication management upon discharge to assure her continuity of care.  At the time of discharge patient is not reporting any acute suicidal/homicidal ideations. She feels more confident about her self & mental health care. She currently denies any new issues or concerns. Education and supportive counseling provided throughout her hospital stay & upon discharge.   Today upon her discharge evaluation with the attending psychiatrist, Kara Huynh shares she is doing well. She denies any other specific concerns. She is sleeping well. Her appetite is good. She denies other physical complaints. She denies AH/VH. She feels that her medications have been helpful & is in agreement to continue  her current treatment regimen as recommended. She was able to engage in safety planning including plan to return to [facility] or contact emergency services if she feels unable to maintain her own safety or the safety of others. Pt had no further questions, comments, or concerns. She left [facility] with all personal belongings in no apparent distress. Transportation per her the ALF staff.  Physical Findings: AIMS: Facial and Oral Movements Muscles of Facial Expression: None, normal Lips and Perioral Area: None, normal Jaw: None, normal Tongue: None, normal,Extremity Movements Upper (arms, wrists, hands, fingers): None, normal Lower (legs, knees, ankles, toes): None, normal, Trunk Movements Neck, shoulders, hips: None, normal, Overall Severity Severity of abnormal movements (highest score from questions above): None, normal Incapacitation due to abnormal movements: None, normal Patient's awareness of abnormal movements (rate only patient's report): No Awareness, Dental Status Current problems with teeth and/or dentures?: No Does patient usually wear dentures?: No  CIWA:    COWS:     Musculoskeletal: Strength & Muscle Tone: within normal limits Gait & Station: normal Patient leans: N/A  Psychiatric Specialty Exam: Physical Exam  Nursing note and vitals reviewed. Constitutional: She appears well-developed.  HENT:  Head: Normocephalic.  Eyes: Pupils are equal, round, and reactive to light.  Cardiovascular: Normal rate.  Respiratory: Effort normal.  Genitourinary:    Genitourinary Comments: Deferred   Musculoskeletal:        General: Normal range of motion.     Cervical back: Normal range of motion.  Neurological: She is alert.  Skin: Skin is warm and dry.    Review of Systems  Constitutional: Negative for chills, diaphoresis and fever.  HENT: Negative for congestion, rhinorrhea, sneezing and sore throat.   Respiratory: Negative for cough, shortness of breath and wheezing.    Cardiovascular: Negative for chest pain and palpitations.  Gastrointestinal: Negative for diarrhea, nausea and vomiting.  Genitourinary: Negative for difficulty urinating.  Musculoskeletal: Negative for myalgias.  Allergic/Immunologic: Negative for environmental allergies and food allergies.  Neurological: Negative for dizziness, tremors, seizures and light-headedness.  Psychiatric/Behavioral: Positive for confusion (Some signs of early dementia present such as forgetfulness, difficulty remembering recent events etc. Currently on Namenda) and dysphoric mood (Stabilized with medication prior to discharge). Negative for agitation, behavioral problems, decreased concentration, hallucinations, self-injury, sleep disturbance and suicidal ideas. The patient is not nervous/anxious and is not hyperactive.     Blood pressure 124/67, pulse 77, temperature 98.8 F (37.1 C), temperature source Oral, resp. rate 18, height 5\' 5"  (1.651 m), weight 84.4 kg, SpO2 97 %.Body mass index is 30.95 kg/m.  See Md's  discharge SRA  Sleep:  Number of Hours: 3.5   Has this patient used any form of tobacco in the last 30 days? (Cigarettes, Smokeless Tobacco, Cigars, and/or Pipes): N/A  Blood Alcohol level:  No results found for: [facility]  Metabolic Disorder Labs:  Lab Results  Component Value Date   HGBA1C 5.9 (H) 04/25/2019   MPG 122.63 04/25/2019   No results found for: PROLACTIN Lab Results  Component Value Date   CHOL 174 04/25/2019   TRIG 119 04/25/2019   HDL 57 04/25/2019   CHOLHDL 3.1 04/25/2019   VLDL 24 04/25/2019   LDLCALC 93 04/25/2019   LDLCALC 92 05/07/2018   See Psychiatric Specialty Exam and Suicide Risk Assessment completed by Attending Physician prior to discharge.  Discharge destination:  Home  Is patient on multiple antipsychotic therapies at discharge:  No   Has Patient had three or more failed trials of antipsychotic monotherapy by history:  No  Recommended Plan for Multiple  Antipsychotic Therapies: NA Discharge Instructions    Diet - low sodium heart healthy   Complete by: As directed    Increase activity slowly   Complete by: As directed      Allergies as of 05/14/2019   No Known Allergies     Medication List    TAKE these medications     Indication  albuterol 108 (90 Base) MCG/ACT inhaler Commonly known as: VENTOLIN HFA Inhale 1 puff into the lungs 4 (four) times daily. For shortness of breath  Indication: Chronic Obstructive Lung Disease   aspirin EC 81 MG tablet Take 1 tablet (81 mg total) by mouth daily. For heart health What changed: additional instructions  Indication: Heart health   atorvastatin 20 MG tablet Commonly known as: LIPITOR Take 1 tablet (20 mg total) by mouth daily at 6 PM. For high cholesterol What changed:   when to take this  additional instructions  Indication: Inherited Heterozygous Hypercholesterolemia, High Amount of Fats in the Blood   busPIRone 10 MG tablet Commonly known as: BUSPAR Take 1 tablet (10 mg total) by mouth 2 (two) times daily. For anxiety  Indication: Anxiety Disorder   donepezil 10 MG tablet Commonly known as: ARICEPT Take 1 tablet (10 mg total) by mouth at bedtime. For dementia  Indication: Alzheimer's Disease   FLUoxetine 10 MG capsule Commonly known as: PROZAC Take 3 capsules (30 mg total) by mouth daily. For depression Start taking on: May 15, 2019 What changed:   medication strength  how much to take  additional instructions  Indication: Major Depressive Disorder   fluticasone 44 MCG/ACT inhaler Commonly known as: FLOVENT HFA Inhale 2 puffs into the lungs 2 (two) times daily. For shortness of breath  Indication: Chronic Obstructive Lung Disease   folic acid 1 MG tablet Commonly known as: FOLVITE Take 1 tablet (1 mg total) by mouth daily. For Folate supplimentation Start taking on: May 15, 2019  Indication: Anemia From Inadequate Folic Acid   hydrOXYzine 10 MG  tablet Commonly known as: ATARAX/VISTARIL Take 1 tablet (10 mg total) by mouth 3 (three) times daily as needed for anxiety.  Indication: Feeling Anxious   levothyroxine 25 MCG tablet Commonly known as: SYNTHROID Take 1 tablet (25 mcg total) by mouth daily at 6 (six) AM. For hypothyroidism Start taking on: May 15, 2019 What changed: See the new instructions.  Indication: Underactive Thyroid   lidocaine 5 % Commonly known as: LIDODERM Place 1 patch onto the skin daily. Remove & Discard patch within 12 hours or  as directed by MD: For pain  Indication: Pain management   lisinopril 5 MG tablet Commonly known as: ZESTRIL Take 1 tablet (5 mg total) by mouth daily. For high blood pressure What changed: additional instructions  Indication: High Blood Pressure Disorder   memantine 5 MG tablet Commonly known as: NAMENDA Take 1 tablet (5 mg total) by mouth 2 (two) times daily. For dementia  Indication: Alzheimer's Disease   thiamine 100 MG tablet Take 1 tablet (100 mg total) by mouth daily. For thiamine replacement Start taking on: May 15, 2019  Indication: Deficiency of Vitamin B1   Vitamin D (Ergocalciferol) 1.25 MG (50000 UNIT) Caps capsule Commonly known as: DRISDOL Take 1 capsule (50,000 Units total) by mouth every 7 (seven) days. For bone health Start taking on: May 15, 2019  Indication: Vitamin D Deficiency      Follow-up Information    [facility]. Go on 05/16/2019.   Why: You are scheduled for an appointment on 05/16/19 at 11:30 am.  This appointment will be held In Person.  Please have your insurance information and your discharge summary available.  Contact information: [place] [address] [phone]        [facility]. Go on 05/14/2019.   Specialty: [place] Why: Accepted for admission on Wednesday, 05/14/2019 anytime after 1:00pm for assisted living facility services.   Contact  information: [place] [phone]         Follow-up recommendations: Activity:  As tolerated Diet: As recommended by your primary care doctor. Keep all scheduled follow-up appointments as recommended.   Comments: Prescriptions given at discharge.  Patient agreeable to plan.  Given opportunity to ask questions.  Appears to feel comfortable with discharge denies any current suicidal or homicidal thought. Patient is also instructed prior to discharge to: Take all medications as prescribed by his/her mental healthcare provider. Report any adverse effects and or reactions from the medicines to his/her outpatient provider promptly. Patient has been instructed & cautioned: To not engage in alcohol and or illegal drug use while on prescription medicines. In the event of worsening symptoms, patient is instructed to call the crisis hotline, 911 and or go to the nearest ED for appropriate evaluation and treatment of symptoms. To follow-up with his/her primary care provider for your other medical issues, concerns and or health care needs.  Signed: Lindell Spar, NP, PMHNP, FNP-BC 05/14/2019, 11:18 AM

## 2019-05-14 NOTE — Progress Notes (Signed)
Recreation Therapy Notes  Date:  2.24.21 Time: 0930 Location: [address] Dayroom  Group Topic: Stress Management  Goal Area(s) Addresses:  Patient will identify positive stress management techniques. Patient will identify benefits of using stress management post d/c.  Intervention: Stress Management  Activity :  Guided Imagery.  LRT read a script that took patients on a walk through a summer meadow.  Patients were to listen and follow along as script was read to participate in activity.   Education:  Stress Management, Discharge Planning.   Education Outcome: Acknowledges Education  Clinical Observations/Feedback: Pt did not attend group session.    Victorino Sparrow, LRT/CTRS         Ria Comment, Dione Petron A 05/14/2019 11:13 AM

## 2019-05-14 NOTE — BHH Suicide Risk Assessment (Signed)
[facility] Discharge Suicide Risk Assessment   Principal Problem: Alzheimer's dementia with behavioral disturbance [facility]) Discharge Diagnoses: Principal Problem:   Alzheimer's dementia with behavioral disturbance ([place]) Active Problems:   MDD (major depressive disorder)   Suicidal ideation   Total Time spent with patient: 15 minutes  Musculoskeletal: Strength & Muscle Tone: within normal limits Gait & Station: shuffle Patient leans: N/A  Psychiatric Specialty Exam: Review of Systems  Musculoskeletal: Positive for arthralgias and back pain.  Psychiatric/Behavioral: Positive for confusion.  All other systems reviewed and are negative.   Blood pressure 124/67, pulse 77, temperature 98.8 F (37.1 C), temperature source Oral, resp. rate 18, height 5\' 5"  (1.651 m), weight 84.4 kg, SpO2 97 %.Body mass index is 30.95 kg/m.  General Appearance: Casual  Eye Contact::  Fair  Speech:  Normal Rate409  Volume:  Normal  Mood:  Anxious  Affect:  Constricted  Thought Process:  Goal Directed and Descriptions of Associations: Circumstantial  Orientation:  Other:  Person and place  Thought Content:  Rumination  Suicidal Thoughts:  No  Homicidal Thoughts:  No  Memory:  Immediate;   Fair Recent;   Poor Remote;   Poor  Judgement:  Intact  Insight:  Fair  Psychomotor Activity:  Increased  Concentration:  Fair  Recall:  Poor  Fund of Knowledge:Fair  Language: Good  Akathisia:  Negative  Handed:  Right  AIMS (if indicated):     Assets:  Desire for Improvement Resilience  Sleep:  Number of Hours: 3.5  Cognition: WNL and Impaired,  Moderate  ADL's:  Intact   Mental Status Per Nursing Assessment::   On Admission:  NA  Demographic Factors:  Divorced or widowed, Caucasian, Low socioeconomic status, Living alone and Unemployed  Loss Factors: NA  Historical Factors: NA  Risk Reduction Factors:   NA  Continued Clinical Symptoms:  Severe Anxiety and/or Agitation Depression:    Impulsivity More than one psychiatric diagnosis Medical Diagnoses and Treatments/Surgeries  Cognitive Features That Contribute To Risk:  Thought constriction (tunnel vision)    Suicide Risk:  Minimal: No identifiable suicidal ideation.  Patients presenting with no risk factors but with morbid ruminations; may be classified as minimal risk based on the severity of the depressive symptoms  Follow-up Information    [facility]. Go on 05/16/2019.   Why: You are scheduled for an appointment on 05/16/19 at 11:30 am.  This appointment will be held In Person.  Please have your insurance information and your discharge summary available.  Contact information: [address] [place] [address] [phone]           Plan Of Care/Follow-up recommendations:  Activity:  ad lib  Sharma Covert, MD 05/14/2019, 7:29 AM

## 2019-05-14 NOTE — Progress Notes (Signed)
  [facility] Adult Case Management Discharge Plan :  Will you be returning to the same living situation after discharge:  No. Patient is discharging to [facility].  At discharge, do you have transportation home?: Yes,  patient will be transported via [employer] Do you have the ability to pay for your medications: Yes,  [employer] Medicare  Release of information consent forms completed and in the chart;  Patient's signature needed at discharge.  Patient to Follow up at: Follow-up Information    [facility]. Go on 05/16/2019.   Why: You are scheduled for an appointment on 05/16/19 at 11:30 am.  This appointment will be held In Person.  Please have your insurance information and your discharge summary available.  Contact information: [address] [place] [address] [phone]           Next level of care provider has access to [place] and Suicide Prevention discussed: Yes,  with the patient's son     Has patient been referred to the Quitline?: N/A patient is not a smoker  Patient has been referred for addiction treatment: N/A  Marylee Floras, [place] 05/14/2019, 9:40 AM

## 2019-05-14 NOTE — Progress Notes (Signed)
CSW spoke with [facility] admissions coordinator, Joycelyn Schmid [phone]) regarding the patient's discharge plan.   Blondie confirmed the patient has been accepted for admission, today 05/14/2019 anytime after 1:00pm. CSW shared the patient will be transported to their facility between 1:30pm-2:00pm. Blondie expressed understanding and did not have any further requests or questions at this time.   CSW will continue to follow for a safe and appropriate discharge.      Radonna Ricker, MSW, LCSW Clinical Social Worker [facility]  Phone: [phone]

## 2019-05-14 NOTE — Progress Notes (Signed)
Pt up much of the evening. Pt struggling with memory. Pt had episode where she forgot her husband died. Pt appeared to be struggling with certain activities and stated she was requesting help and was upset due to the response she believed she was getting. Pt continued to express back pain the she stated started when she came here. 1:1 time spent with writer and pt appeared to calm down and stated she would try to lay down and go to sleep.

## 2019-05-14 NOTE — BHH Counselor (Signed)
CSW attempted to reach son, Maisee Kanak, [phone]) to update family of patient's disposition. CSW left a HIPAA compliant voicemail.  Stephanie Acre, MSW, [place] Social Worker [facility]  [phone]

## 2019-05-14 NOTE — BHH Counselor (Signed)
CSW spoke with Wells Guiles at [facility] [phone]), the facility has scheduled this patient for a 1:00pm move in. CSW to arrange transportation.  Stephanie Acre, MSW, [place] Social Worker [facility]  [phone]

## 2019-05-27 ENCOUNTER — Telehealth: Payer: Self-pay | Admitting: Adult Health

## 2019-05-27 NOTE — Telephone Encounter (Signed)
Medication Refill: Blood Pressure Medication  Pharmacy: [facility] [address], [place] [address] Phone: [phone]   Pt is now living in [place] with cousin due to her health declining she can not live alone. She will be getting a pcp there but for now needs a refill on medications. Pt cousin Lynett will become POA.

## 2019-05-27 NOTE — Telephone Encounter (Signed)
[place] for 90 days. I wish her the best of luck and it was a pleasure caring for her

## 2019-05-28 MED ORDER — LEVOTHYROXINE SODIUM 25 MCG PO TABS: 25.0000 ug | ORAL_TABLET | Freq: Every day | ORAL | 0 refills | Status: AC

## 2019-05-28 MED ORDER — DONEPEZIL HCL 10 MG PO TABS: 10.0000 mg | ORAL_TABLET | Freq: Every day | ORAL | 0 refills | Status: AC

## 2019-05-28 MED ORDER — MEMANTINE HCL 5 MG PO TABS: 5.0000 mg | ORAL_TABLET | Freq: Two times a day (BID) | ORAL | 0 refills | Status: AC

## 2019-05-28 MED ORDER — BUSPIRONE HCL 10 MG PO TABS: 10.0000 mg | ORAL_TABLET | Freq: Two times a day (BID) | ORAL | 0 refills | Status: AC

## 2019-05-28 MED ORDER — LISINOPRIL 5 MG PO TABS: 5.0000 mg | ORAL_TABLET | Freq: Every day | ORAL | 0 refills | Status: AC

## 2019-05-28 MED ORDER — FLUOXETINE HCL 10 MG PO CAPS: 30.0000 mg | ORAL_CAPSULE | Freq: Every day | ORAL | 0 refills | Status: AC

## 2019-05-28 MED ORDER — HYDROXYZINE HCL 10 MG PO TABS: 10.0000 mg | ORAL_TABLET | Freq: Three times a day (TID) | ORAL | 0 refills | Status: AC | PRN

## 2019-05-28 MED ORDER — ATORVASTATIN CALCIUM 20 MG PO TABS: 20.0000 mg | ORAL_TABLET | Freq: Every day | ORAL | 0 refills | Status: AC

## 2019-05-28 NOTE — Telephone Encounter (Signed)
Sent to the pharmacy by e-scribe. 

## 2019-05-28 NOTE — Telephone Encounter (Signed)
Ok to fill for 90 days

## 2019-05-28 NOTE — Telephone Encounter (Signed)
Spoke to Kara Huynh.  She needs atorvastatin, lisinopril, hydroxyzine, buspitone, memantine, donepezil, fluoxetine and levothyroxine.  Ok to fill all these for 90 days?

## 2019-06-01 DIAGNOSIS — E039 Hypothyroidism, unspecified: Secondary | ICD-10-CM | POA: Diagnosis not present

## 2019-06-01 DIAGNOSIS — G309 Alzheimer's disease, unspecified: Secondary | ICD-10-CM | POA: Diagnosis not present

## 2019-06-01 DIAGNOSIS — R69 Illness, unspecified: Secondary | ICD-10-CM | POA: Diagnosis not present

## 2019-06-03 ENCOUNTER — Telehealth: Payer: Self-pay | Admitting: Family Medicine

## 2019-06-03 NOTE — Telephone Encounter (Signed)
PA submitted to CoverMyMeds.  Waiting on determination.  [facility] - PA Case ID: [id number] - Rx #: [id number]

## 2019-06-04 NOTE — Telephone Encounter (Signed)
Spoke to the pt and informed her that PA was denied.  She has an upcoming establish care appointment with new PCP on 06/26/19.  Nothing further needed.

## 2019-06-04 NOTE — Telephone Encounter (Signed)
I would have her follow up with her new PCP to see if they can get the medication covered

## 2019-06-04 NOTE — Telephone Encounter (Signed)
The PA for hydrOXYzine (ATARAX/VISTARIL) 10 MG tablet has been denied.  Please advise.

## 2019-06-04 NOTE — Telephone Encounter (Signed)
Will need to call the pt at [phone].

## 2019-06-11 DIAGNOSIS — R05 Cough: Secondary | ICD-10-CM | POA: Diagnosis not present

## 2019-06-11 DIAGNOSIS — J329 Chronic sinusitis, unspecified: Secondary | ICD-10-CM | POA: Diagnosis not present

## 2019-06-17 DIAGNOSIS — R778 Other specified abnormalities of plasma proteins: Secondary | ICD-10-CM | POA: Diagnosis not present

## 2019-06-17 DIAGNOSIS — R55 Syncope and collapse: Secondary | ICD-10-CM | POA: Diagnosis not present

## 2019-06-17 DIAGNOSIS — R918 Other nonspecific abnormal finding of lung field: Secondary | ICD-10-CM | POA: Diagnosis not present

## 2019-06-17 DIAGNOSIS — I700 Atherosclerosis of aorta: Secondary | ICD-10-CM | POA: Diagnosis not present

## 2019-06-17 DIAGNOSIS — J189 Pneumonia, unspecified organism: Secondary | ICD-10-CM | POA: Diagnosis not present

## 2019-06-17 DIAGNOSIS — E872 Acidosis: Secondary | ICD-10-CM | POA: Diagnosis not present

## 2019-06-17 DIAGNOSIS — R9082 White matter disease, unspecified: Secondary | ICD-10-CM | POA: Diagnosis not present

## 2019-06-17 DIAGNOSIS — R410 Disorientation, unspecified: Secondary | ICD-10-CM | POA: Diagnosis not present

## 2019-06-17 DIAGNOSIS — R0600 Dyspnea, unspecified: Secondary | ICD-10-CM | POA: Diagnosis not present

## 2019-06-17 DIAGNOSIS — Z20822 Contact with and (suspected) exposure to covid-19: Secondary | ICD-10-CM | POA: Diagnosis not present

## 2019-06-17 DIAGNOSIS — Z9989 Dependence on other enabling machines and devices: Secondary | ICD-10-CM | POA: Diagnosis not present

## 2019-06-17 DIAGNOSIS — R531 Weakness: Secondary | ICD-10-CM | POA: Diagnosis not present

## 2019-06-17 DIAGNOSIS — R0902 Hypoxemia: Secondary | ICD-10-CM | POA: Diagnosis not present

## 2019-06-17 DIAGNOSIS — R519 Headache, unspecified: Secondary | ICD-10-CM | POA: Diagnosis not present

## 2019-06-17 DIAGNOSIS — R0602 Shortness of breath: Secondary | ICD-10-CM | POA: Diagnosis not present

## 2019-06-17 DIAGNOSIS — K449 Diaphragmatic hernia without obstruction or gangrene: Secondary | ICD-10-CM | POA: Diagnosis not present

## 2019-06-17 DIAGNOSIS — G9340 Encephalopathy, unspecified: Secondary | ICD-10-CM | POA: Diagnosis not present

## 2019-06-17 DIAGNOSIS — R4182 Altered mental status, unspecified: Secondary | ICD-10-CM | POA: Diagnosis not present

## 2019-06-18 DIAGNOSIS — R69 Illness, unspecified: Secondary | ICD-10-CM | POA: Diagnosis not present

## 2019-06-18 DIAGNOSIS — R55 Syncope and collapse: Secondary | ICD-10-CM | POA: Diagnosis not present

## 2019-06-18 DIAGNOSIS — R4182 Altered mental status, unspecified: Secondary | ICD-10-CM | POA: Diagnosis not present

## 2019-06-18 DIAGNOSIS — G9340 Encephalopathy, unspecified: Secondary | ICD-10-CM | POA: Diagnosis not present

## 2019-07-01 ENCOUNTER — Other Ambulatory Visit: Payer: Self-pay | Admitting: Adult Health

## 2019-07-01 DIAGNOSIS — F331 Major depressive disorder, recurrent, moderate: Secondary | ICD-10-CM

## 2019-07-01 NOTE — Telephone Encounter (Signed)
PATIENT HAS MOVED TO ANOTHER STATE.  NO LONGER SEEN BY [facility].

## 2019-08-22 ENCOUNTER — Other Ambulatory Visit: Payer: Self-pay | Admitting: Adult Health

## 2019-08-22 NOTE — Telephone Encounter (Signed)
DENIED.  PT HAS MOVED.  NO LONGER SEEN BY [facility].

## 2020-05-20 IMAGING — CT CT HEAD WITHOUT CONTRAST
3 series · 16 of 47 positions shown, 19 images · non-contrast
Comparison: MRI and CT brain 07/18/2018

CLINICAL DATA: Altered LOC

EXAM:
CT HEAD WITHOUT CONTRAST
TECHNIQUE: Contiguous axial images were obtained from the base of the skull
through the vertex without intravenous contrast.

[Series 3: head 5.0 h30s · axial · 0.44mm/px · z∈[-130,+5]mm · 10 of 33 slices shown, 13 images]
[im 3/33  brain]
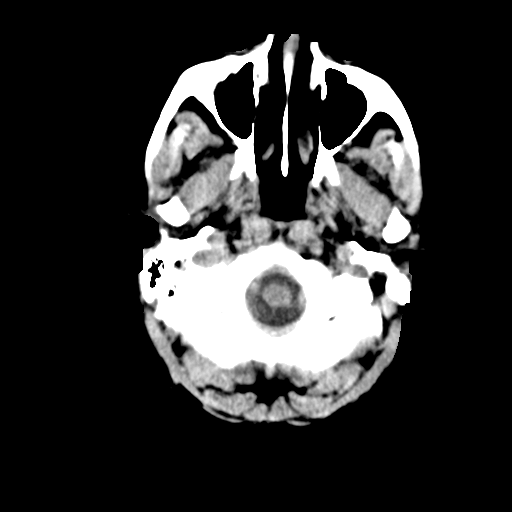
[im 3/33  bone]
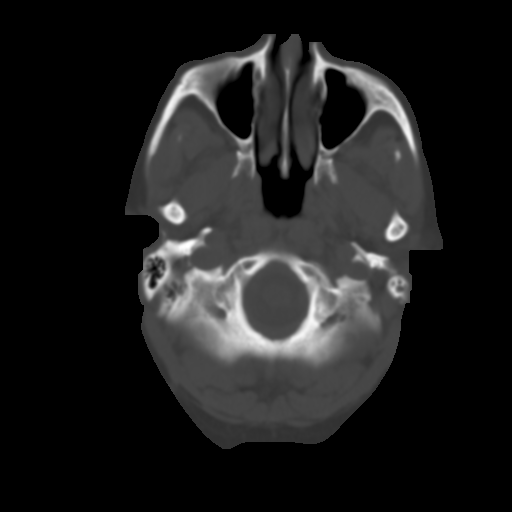
[im 6/33  brain]
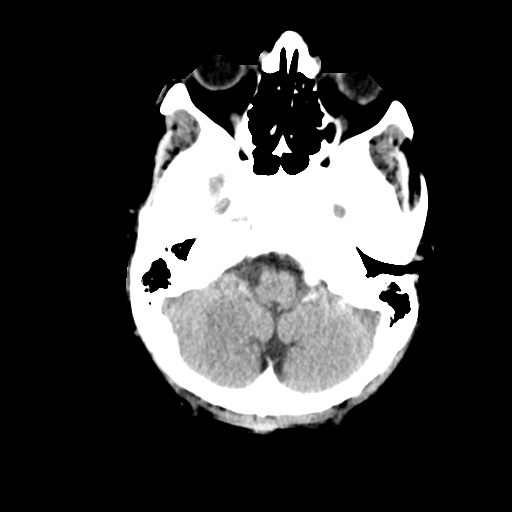
[im 9/33  brain]
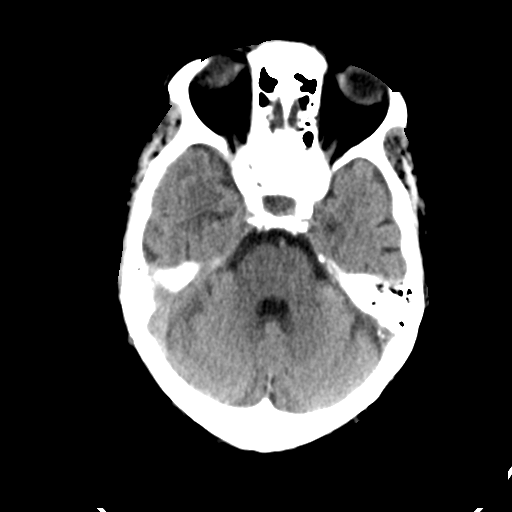
[im 12/33  brain]
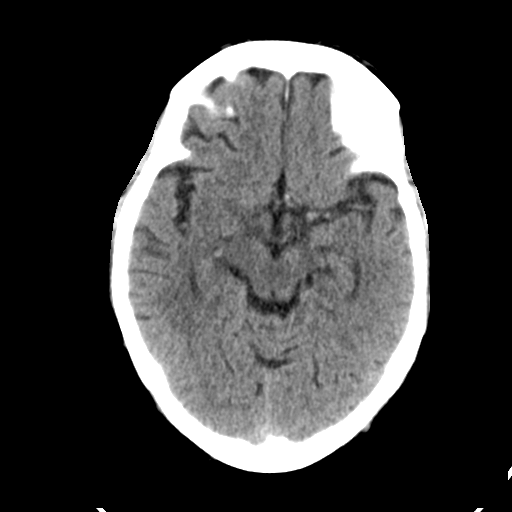
[im 15/33  brain]
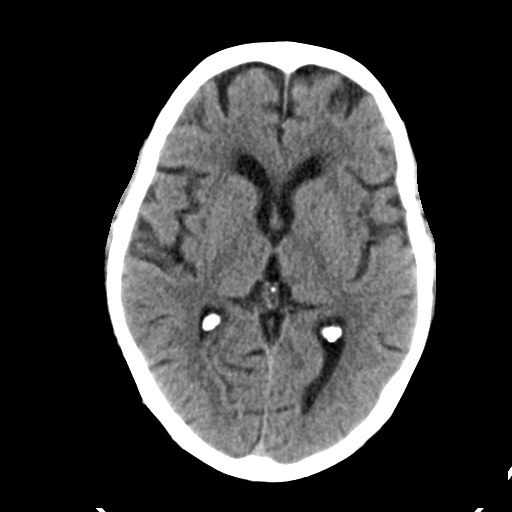
[im 15/33  bone]
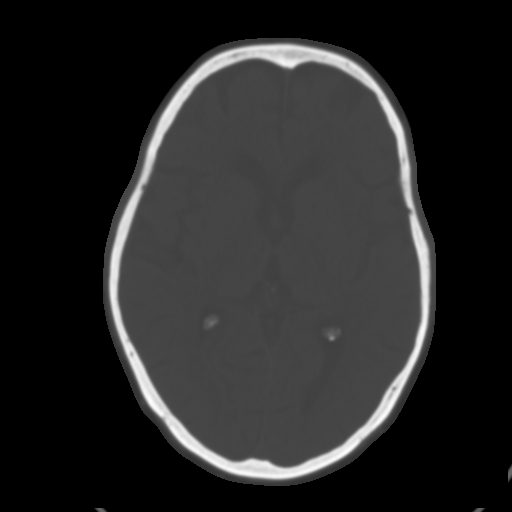
[im 18/33  brain]
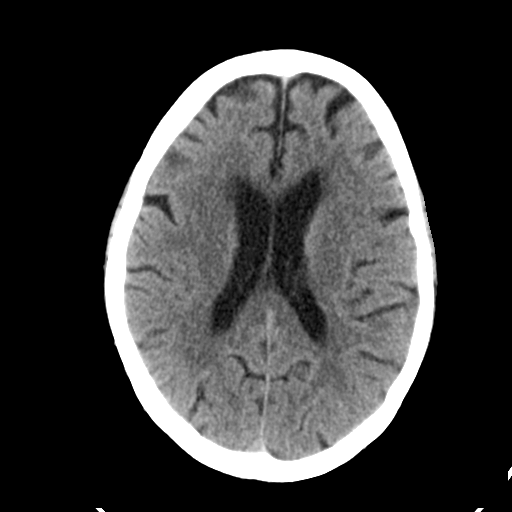
[im 21/33  brain]
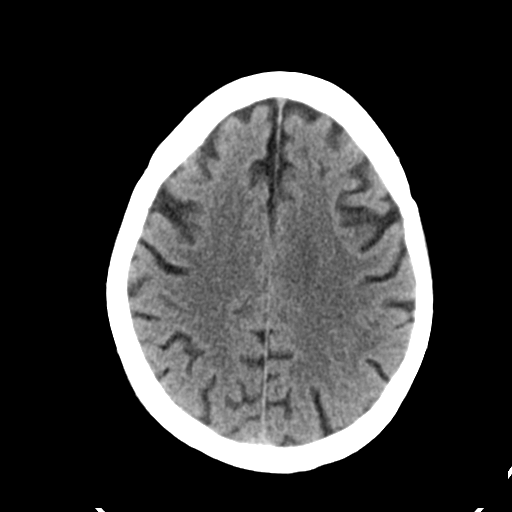
[im 25/33  brain]
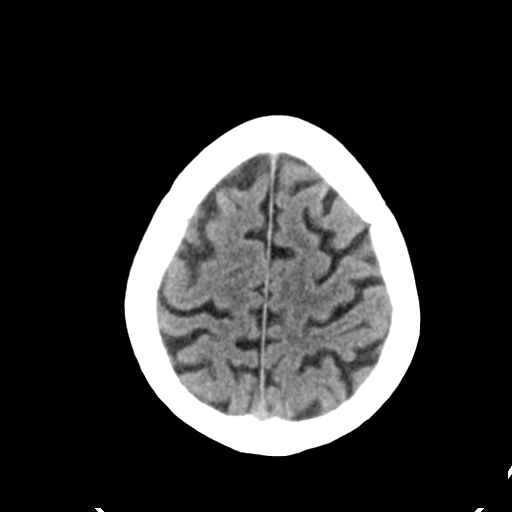
[im 27/33  brain]
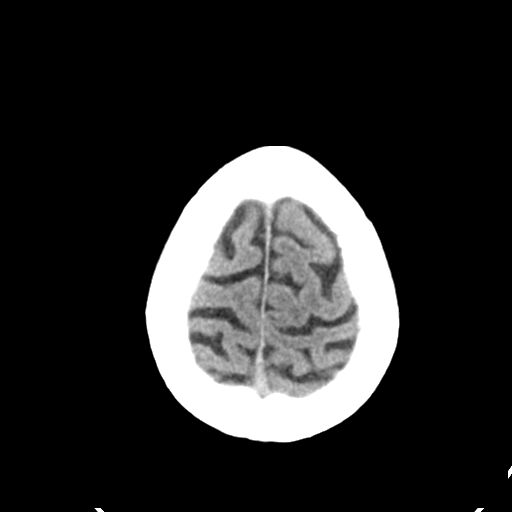
[im 27/33  bone]
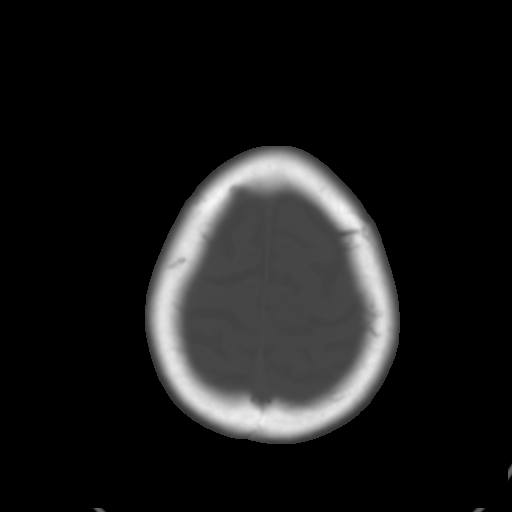
[im 30/33  brain]
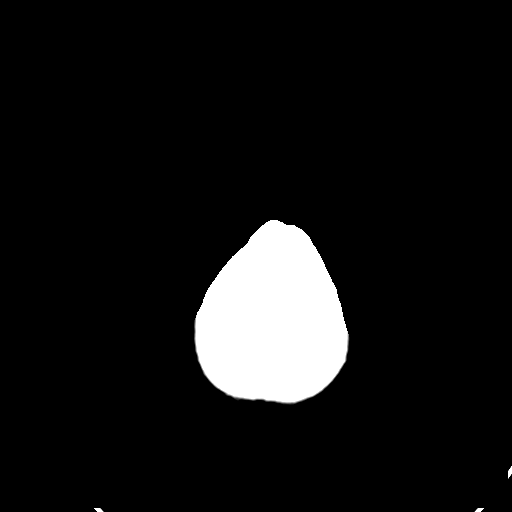

[Series 5: head 3.0 mpr cor · coronal · 0.32mm/px · 3 of 67 slices shown]
[im 23/67  brain]
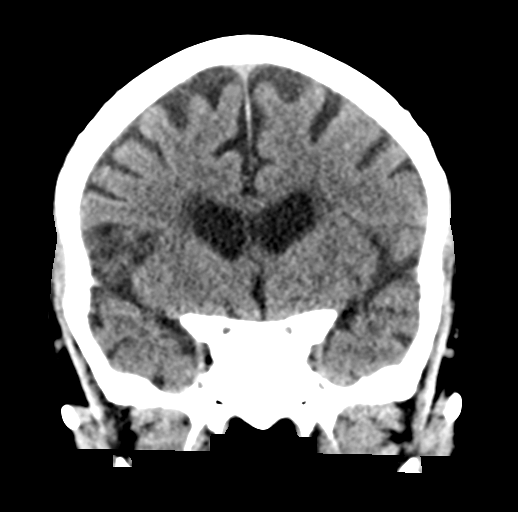
[im 30/67  brain]
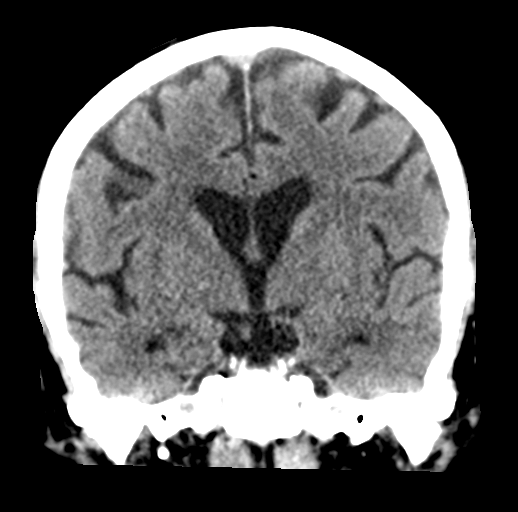
[im 37/67  brain]
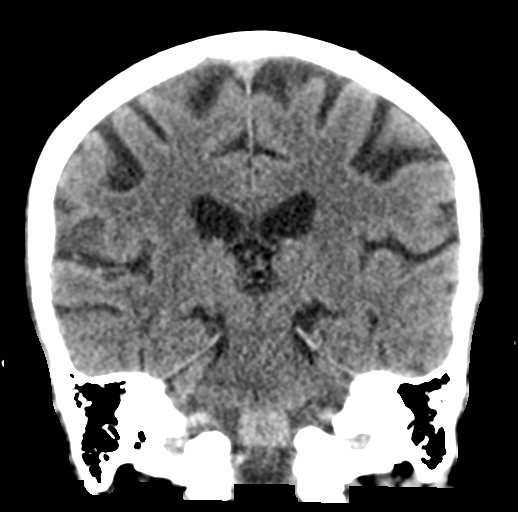

[Series 6: head 3.0 mpr sag · sagittal · 0.32mm/px · 3 of 57 slices shown]
[im 19/57  brain]
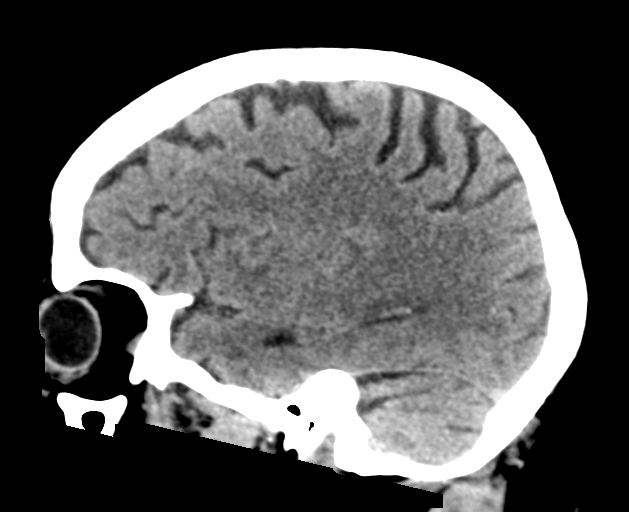
[im 29/57  brain]
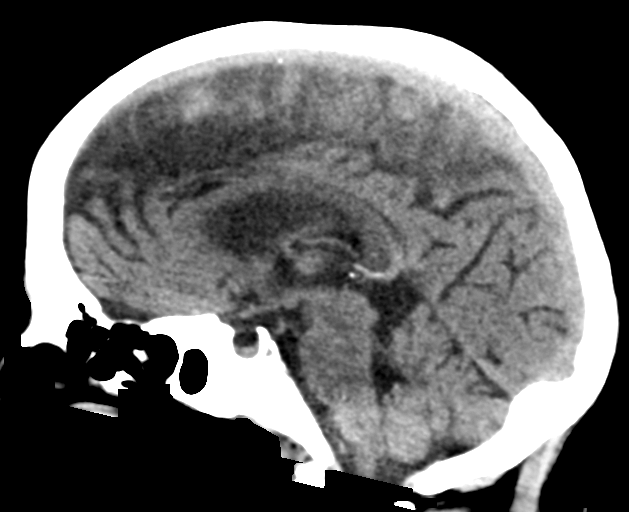
[im 38/57  brain]
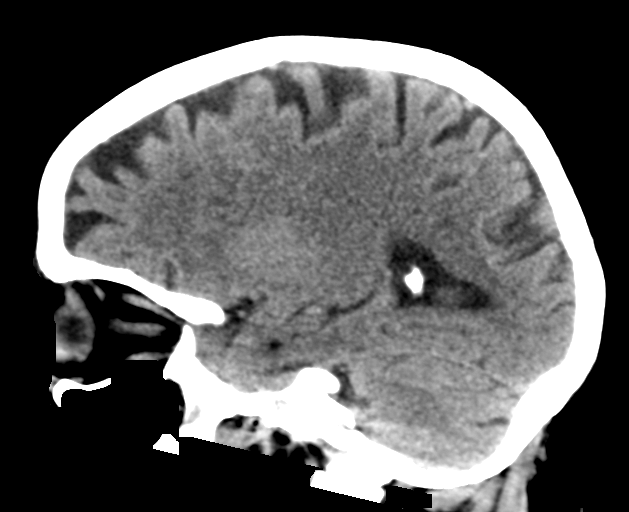

[16 of 47 positions shown; findings below may reference images not displayed]

FINDINGS: Brain: No acute territorial infarction, hemorrhage or intracranial
mass. Mild small vessel ischemic changes of the white matter and
mild atrophy. Stable ventricle size.

Vascular: No hyperdense vessels.  Carotid vascular calcification.

Skull: Sclerosis at the skull base unchanged.  No fracture

Sinuses/Orbits: No acute finding.

Other: None
IMPRESSION: 1. No CT evidence for acute intracranial abnormality.
2. Atrophy and mild small vessel ischemic changes of the white
matter
